# Patient Record
Sex: Female | Born: 1980 | Race: White | Hispanic: No | Marital: Single
Health system: Southern US, Academic
[De-identification: ages and names within clinical notes are randomized; demographics above are authoritative.]

## PROBLEM LIST (undated history)

## (undated) ENCOUNTER — Encounter: Attending: Gynecologic Oncology | Primary: Gynecologic Oncology

## (undated) ENCOUNTER — Telehealth

## (undated) ENCOUNTER — Encounter: Attending: Women's Health | Primary: Women's Health

## (undated) ENCOUNTER — Encounter

## (undated) ENCOUNTER — Encounter: Attending: Adult Health | Primary: Adult Health

## (undated) ENCOUNTER — Telehealth
Attending: Student in an Organized Health Care Education/Training Program | Primary: Student in an Organized Health Care Education/Training Program

## (undated) ENCOUNTER — Ambulatory Visit: Attending: Gynecologic Oncology | Primary: Gynecologic Oncology

## (undated) ENCOUNTER — Ambulatory Visit

## (undated) ENCOUNTER — Encounter: Attending: Obstetrics & Gynecology | Primary: Obstetrics & Gynecology

## (undated) ENCOUNTER — Encounter
Attending: Student in an Organized Health Care Education/Training Program | Primary: Student in an Organized Health Care Education/Training Program

## (undated) ENCOUNTER — Ambulatory Visit: Attending: Obstetrics & Gynecology | Primary: Obstetrics & Gynecology

## (undated) ENCOUNTER — Encounter: Attending: Clinical | Primary: Clinical

## (undated) ENCOUNTER — Encounter: Payer: BLUE CROSS/BLUE SHIELD | Attending: Gynecologic Oncology | Primary: Gynecologic Oncology

## (undated) ENCOUNTER — Telehealth: Attending: Gynecologic Oncology | Primary: Gynecologic Oncology

## (undated) ENCOUNTER — Ambulatory Visit: Payer: BLUE CROSS/BLUE SHIELD

## (undated) ENCOUNTER — Encounter: Attending: Anesthesiology | Primary: Anesthesiology

## (undated) ENCOUNTER — Telehealth: Attending: Adult Health | Primary: Adult Health

## (undated) ENCOUNTER — Telehealth: Attending: Physician Assistant | Primary: Physician Assistant

## (undated) ENCOUNTER — Encounter: Attending: Physician Assistant | Primary: Physician Assistant

## (undated) ENCOUNTER — Encounter: Attending: Registered" | Primary: Registered"

## (undated) DIAGNOSIS — I252 Old myocardial infarction: Secondary | ICD-10-CM

## (undated) DIAGNOSIS — N200 Calculus of kidney: Secondary | ICD-10-CM

## (undated) DIAGNOSIS — F909 Attention-deficit hyperactivity disorder, unspecified type: Secondary | ICD-10-CM

## (undated) DIAGNOSIS — F419 Anxiety disorder, unspecified: Secondary | ICD-10-CM

## (undated) DIAGNOSIS — I10 Essential (primary) hypertension: Secondary | ICD-10-CM

## (undated) DIAGNOSIS — I700 Atherosclerosis of aorta: Secondary | ICD-10-CM

## (undated) DIAGNOSIS — Z955 Presence of coronary angioplasty implant and graft: Secondary | ICD-10-CM

## (undated) DIAGNOSIS — K439 Ventral hernia without obstruction or gangrene: Secondary | ICD-10-CM

## (undated) DIAGNOSIS — F32A Depression, unspecified: Secondary | ICD-10-CM

## (undated) DIAGNOSIS — N201 Calculus of ureter: Secondary | ICD-10-CM

## (undated) DIAGNOSIS — F329 Major depressive disorder, single episode, unspecified: Secondary | ICD-10-CM

## (undated) DIAGNOSIS — C569 Malignant neoplasm of unspecified ovary: Secondary | ICD-10-CM

## (undated) HISTORY — PX: DILATION AND CURETTAGE OF UTERUS: SHX78

## (undated) HISTORY — PX: MOUTH SURGERY: SHX715

## (undated) HISTORY — PX: CORONARY ANGIOPLASTY WITH STENT PLACEMENT: SHX49

## (undated) HISTORY — PX: TRANSTHORACIC ECHOCARDIOGRAM: SHX275

## (undated) HISTORY — PX: ROBOTIC ASSISTED TOTAL HYSTERECTOMY: SHX6085

## (undated) HISTORY — PX: OTHER SURGICAL HISTORY: SHX169

## (undated) HISTORY — PX: BREAST REDUCTION SURGERY: SHX8

## (undated) HISTORY — PX: ABDOMINAL HYSTERECTOMY: SHX81

---

## 2000-04-14 ENCOUNTER — Other Ambulatory Visit: Admission: RE | Admit: 2000-04-14 | Discharge: 2000-04-14 | Payer: Self-pay | Admitting: Obstetrics and Gynecology

## 2001-03-28 ENCOUNTER — Other Ambulatory Visit: Admission: RE | Admit: 2001-03-28 | Discharge: 2001-03-28 | Payer: Self-pay | Admitting: Obstetrics and Gynecology

## 2002-04-17 ENCOUNTER — Other Ambulatory Visit: Admission: RE | Admit: 2002-04-17 | Discharge: 2002-04-17 | Payer: Self-pay | Admitting: Obstetrics and Gynecology

## 2004-01-03 ENCOUNTER — Other Ambulatory Visit: Admission: RE | Admit: 2004-01-03 | Discharge: 2004-01-03 | Payer: Self-pay | Admitting: *Deleted

## 2004-01-15 ENCOUNTER — Encounter: Admission: RE | Admit: 2004-01-15 | Discharge: 2004-04-14 | Payer: Self-pay | Admitting: Family Medicine

## 2006-09-14 ENCOUNTER — Encounter: Admission: RE | Admit: 2006-09-14 | Discharge: 2006-09-14 | Payer: Self-pay | Admitting: Oral Surgery

## 2006-10-29 ENCOUNTER — Other Ambulatory Visit: Admission: RE | Admit: 2006-10-29 | Discharge: 2006-10-29 | Payer: Self-pay | Admitting: Obstetrics and Gynecology

## 2008-01-09 ENCOUNTER — Other Ambulatory Visit: Admission: RE | Admit: 2008-01-09 | Discharge: 2008-01-09 | Payer: Self-pay | Admitting: Obstetrics and Gynecology

## 2009-01-13 ENCOUNTER — Emergency Department (HOSPITAL_COMMUNITY): Admission: EM | Admit: 2009-01-13 | Discharge: 2009-01-13 | Payer: Self-pay | Admitting: Emergency Medicine

## 2013-04-25 DIAGNOSIS — I10 Essential (primary) hypertension: Secondary | ICD-10-CM | POA: Insufficient documentation

## 2013-04-25 DIAGNOSIS — J45909 Unspecified asthma, uncomplicated: Secondary | ICD-10-CM | POA: Insufficient documentation

## 2013-04-25 DIAGNOSIS — F988 Other specified behavioral and emotional disorders with onset usually occurring in childhood and adolescence: Secondary | ICD-10-CM | POA: Insufficient documentation

## 2013-04-25 DIAGNOSIS — E669 Obesity, unspecified: Secondary | ICD-10-CM | POA: Insufficient documentation

## 2013-05-09 DIAGNOSIS — I252 Old myocardial infarction: Secondary | ICD-10-CM

## 2013-05-09 DIAGNOSIS — I214 Non-ST elevation (NSTEMI) myocardial infarction: Secondary | ICD-10-CM | POA: Insufficient documentation

## 2013-05-09 HISTORY — DX: Old myocardial infarction: I25.2

## 2013-06-13 DIAGNOSIS — G4730 Sleep apnea, unspecified: Secondary | ICD-10-CM | POA: Insufficient documentation

## 2013-08-10 ENCOUNTER — Inpatient Hospital Stay (HOSPITAL_COMMUNITY)
Admission: RE | Admit: 2013-08-10 | Discharge: 2013-08-10 | Disposition: A | Discharge status: Home / Self Care | Payer: Self-pay | Source: Ambulatory Visit

## 2013-08-10 NOTE — Progress Notes (Signed)
Cardiac Rehab Medication Review by a Pharmacist  Does the patient  feel that his/her medications are working for him/her?  yes  Has the patient been experiencing any side effects to the medications prescribed?  no  Does the patient measure his/her own blood pressure or blood glucose at home?  yes   Does the patient have any problems obtaining medications due to transportation or finances?   no  Understanding of regimen: excellent Understanding of indications: excellent Potential of compliance: excellent    Pharmacist comments: 33 yo f who presented today to the cardiac rehab clinic for initial evaluation and medication review.  Patient expresses concern over her blood pressure.  She was recently switched from lisinopril to losartan due to intolerance (cough).  Patient monitors blood pressure at home and finds it is higher on the losartan. Discussed with  Nurse who will follow up with doctor.  Patient expresses no other concerns at this time and is adherent to medications.   Cassie L. Nicole Kindred, PharmD Clinical Pharmacy Resident Pager: [phone] 08/10/2013 8:45 AM

## 2013-08-14 ENCOUNTER — Encounter (HOSPITAL_COMMUNITY): Payer: Self-pay

## 2013-08-16 ENCOUNTER — Encounter (HOSPITAL_COMMUNITY): Payer: Self-pay

## 2013-08-18 ENCOUNTER — Encounter (HOSPITAL_COMMUNITY)
Admission: RE | Admit: 2013-08-18 | Discharge: 2013-08-18 | Disposition: A | Discharge status: Home / Self Care | Payer: Self-pay | Source: Ambulatory Visit | Attending: Cardiology | Admitting: Cardiology

## 2013-08-18 ENCOUNTER — Encounter (HOSPITAL_COMMUNITY): Payer: Self-pay

## 2013-08-18 DIAGNOSIS — I252 Old myocardial infarction: Secondary | ICD-10-CM | POA: Insufficient documentation

## 2013-08-18 DIAGNOSIS — Z5189 Encounter for other specified aftercare: Secondary | ICD-10-CM | POA: Insufficient documentation

## 2013-08-18 NOTE — Progress Notes (Signed)
Pt started cardiac rehab today.  Pt tolerated moderate exercise without difficulty.  Pt transferred to [facility] from [facility]. Pt asymptomatic. VSS except exertional hypertension-peak 190/100 work load decreased recheck:  174/80, returned to normal with rest.   telemetry-NSR.  Will continue to monitor. Pt oriented to exercise equipment and routine.  Understanding verbalized.

## 2013-08-21 ENCOUNTER — Encounter (HOSPITAL_COMMUNITY): Payer: Self-pay

## 2013-08-23 ENCOUNTER — Encounter (HOSPITAL_COMMUNITY): Payer: Self-pay

## 2013-08-25 ENCOUNTER — Encounter (HOSPITAL_COMMUNITY): Payer: Self-pay

## 2013-08-28 ENCOUNTER — Encounter (HOSPITAL_COMMUNITY): Payer: Self-pay

## 2013-08-30 ENCOUNTER — Encounter (HOSPITAL_COMMUNITY): Admission: RE | Admit: 2013-08-30 | Payer: Self-pay | Source: Ambulatory Visit

## 2013-08-30 ENCOUNTER — Telehealth (HOSPITAL_COMMUNITY): Payer: Self-pay | Admitting: Cardiac Rehabilitation

## 2013-08-30 NOTE — Telephone Encounter (Signed)
pc to pt to assess reason for continued absence from cardiac rehab.  Pt states she is no longer interested in attending.  She feels tat she has adequately met her rehab goals through her previous program and plans to exercise on her own.  Pt will be discharged from program.

## 2013-09-01 ENCOUNTER — Encounter (HOSPITAL_COMMUNITY): Payer: Self-pay

## 2013-09-04 ENCOUNTER — Encounter (HOSPITAL_COMMUNITY): Payer: Self-pay

## 2013-09-06 ENCOUNTER — Encounter (HOSPITAL_COMMUNITY): Payer: Self-pay

## 2013-09-08 ENCOUNTER — Encounter (HOSPITAL_COMMUNITY): Payer: Self-pay

## 2013-09-11 ENCOUNTER — Encounter (HOSPITAL_COMMUNITY): Payer: Self-pay

## 2013-09-13 ENCOUNTER — Encounter (HOSPITAL_COMMUNITY): Payer: Self-pay

## 2013-09-13 ENCOUNTER — Ambulatory Visit (INDEPENDENT_AMBULATORY_CARE_PROVIDER_SITE_OTHER): Payer: BC Managed Care – PPO | Admitting: Podiatrist

## 2013-09-13 ENCOUNTER — Encounter: Payer: Self-pay | Admitting: Podiatrist

## 2013-09-13 VITALS — BP 143/89 | HR 65 | Resp 16

## 2013-09-13 DIAGNOSIS — L03039 Cellulitis of unspecified toe: Secondary | ICD-10-CM

## 2013-09-13 NOTE — Patient Instructions (Signed)

## 2013-09-13 NOTE — Progress Notes (Signed)
   Subjective:    Patient ID: Shelley Vega, female    DOB: [birth year], 33 y.o.   MRN: [id number]  HPI Comments: "I have a sore toe"  Patient c/o throbbing 1st toe left, lateral border, for several months. Just recently it has gotten worse. The area is red and swollen. Some drainage. She has been soaking and using antibiotic ointment. Not any better.  Note:  She had a heart attack April 20th 2015. She is on a blood thinner.  Toe Pain       Review of Systems  Musculoskeletal: Positive for gait problem.  Skin: Positive for wound.  Hematological: Bruises/bleeds easily.  All other systems reviewed and are negative.      Objective:   Physical Exam  Patient is awake, alert, and oriented x 3.  In no acute distress.  Vascular status is intact with palpable pedal pulses at 2/4 DP and PT bilateral and capillary refill time within normal limits. Neurological sensation is also intact bilaterally via Semmes Weinstein monofilament at 5/5 sites. Light touch, vibratory sensation, Achilles tendon reflex is intact. Dermatological exam reveals skin color, turger and texture as normal. No open lesions present.  Musculature intact with dorsiflexion, plantarflexion, inversion, eversion.  Left great toenail appears infected ingrown on the lateral nail border. It is painful to the touch. Redness around the border is also present. No streaking or lymphangitis is seen.   Assessment & Plan:  Paronychia left great toenail lateral border  Plan: Recommended an incision and drainage and the patient agreed I prepped the skin with alcohol and partially lidocaine Marcaine mixture in a digital block fashion. The toe was then prepped with Betadine exsanguinated. The lateral nail border was then sharply removed. Antibiotic ointment and a dressing was applied she was given instructions for aftercare. She'll be seen back for followup as needed and will call if any problems arise.

## 2013-09-14 ENCOUNTER — Ambulatory Visit: Payer: Self-pay

## 2013-09-15 ENCOUNTER — Encounter (HOSPITAL_COMMUNITY): Payer: Self-pay

## 2013-09-18 ENCOUNTER — Encounter (HOSPITAL_COMMUNITY): Payer: Self-pay

## 2014-06-15 ENCOUNTER — Other Ambulatory Visit: Payer: Self-pay | Admitting: Obstetrics and Gynecology

## 2014-06-15 DIAGNOSIS — N858 Other specified noninflammatory disorders of uterus: Secondary | ICD-10-CM

## 2014-06-19 ENCOUNTER — Ambulatory Visit
Admission: RE | Admit: 2014-06-19 | Discharge: 2014-06-19 | Disposition: A | Discharge status: Home / Self Care | Payer: BC Managed Care – PPO | Source: Ambulatory Visit | Attending: Obstetrics and Gynecology | Admitting: Obstetrics and Gynecology

## 2014-06-19 DIAGNOSIS — N858 Other specified noninflammatory disorders of uterus: Secondary | ICD-10-CM

## 2014-06-19 MED ORDER — IOHEXOL 300 MG/ML  SOLN
100.0000 mL | Freq: Once | INTRAMUSCULAR | Status: AC | PRN
  Administered 2014-06-19: 100 mL via INTRAVENOUS

## 2014-06-22 ENCOUNTER — Ambulatory Visit: Payer: BC Managed Care – PPO | Admitting: Certified Nurse Midwife

## 2014-07-02 ENCOUNTER — Ambulatory Visit: Payer: BC Managed Care – PPO | Admitting: Gynecologic Oncology

## 2014-07-23 DIAGNOSIS — C569 Malignant neoplasm of unspecified ovary: Secondary | ICD-10-CM | POA: Insufficient documentation

## 2014-07-25 ENCOUNTER — Ambulatory Visit: Payer: BC Managed Care – PPO | Attending: Gynecologic Oncology | Admitting: Gynecologic Oncology

## 2014-07-25 ENCOUNTER — Encounter: Payer: Self-pay | Admitting: Gynecologic Oncology

## 2014-07-25 VITALS — BP 143/98 | HR 62 | Temp 98.4°F | Resp 18 | Ht 61.75 in | Wt 181.9 lb

## 2014-07-25 DIAGNOSIS — I2510 Atherosclerotic heart disease of native coronary artery without angina pectoris: Secondary | ICD-10-CM | POA: Insufficient documentation

## 2014-07-25 DIAGNOSIS — E785 Hyperlipidemia, unspecified: Secondary | ICD-10-CM | POA: Insufficient documentation

## 2014-07-25 DIAGNOSIS — C561 Malignant neoplasm of right ovary: Secondary | ICD-10-CM

## 2014-07-25 DIAGNOSIS — Z7189 Other specified counseling: Secondary | ICD-10-CM | POA: Diagnosis not present

## 2014-07-25 MED ORDER — HYDROMORPHONE HCL 4 MG PO TABS: 4.0000 mg | ORAL_TABLET | ORAL | Status: DC | PRN

## 2014-07-25 NOTE — Progress Notes (Signed)
Consult Note: Gyn-Onc  IRISHA GRANDMAISON 34 y.o. 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 Disposition is to carboplatin and taxol chemotherapy. Patient should also undergo genetic testing.  30 minutes face to face time with spent with the patient and her mother today discussing her pathology and treatment planning. The patient is interested in proceeding with chemotherapy with paclitaxel and carboplatin. We also discussed the importance of proceeding with genetic testing and counseling. They wish to do both. Of note the patient still has her left ovary. We may consider removal of that ovary pending the results of her genetics when she completes her chemotherapy and she is open to thinking about this during her treatment as she's not been for her to make a decision and initially when her fertility sparing procedures done. She scheduled to come in on Friday for removal. After discussion she would like to have her chemotherapy in [facility] where I can coordinate all of her care. We will schedule that accordingly.  CC:  Chief Complaint  Patient presents with  . Ovarian Cancer    HPI: Referring Provider: Juanda Chance, NP  Primary Oncologist: Nancy Marus, MD  History of Present Illness: Marya Lowden is a 34 y.o. woman who presented with some odor with urination, vaginal discharge and STD testing. She reports regular periods without intermenstrual bleeding that states they've become much heavier for the past couple of months. She was subsequently diagnosed with a large abdominal mass. Today, she denies early satiety, significant weight loss, or bloating. She does note that she has some increased fullness of the past couple weeks but nothing significant.  Patients cardiologist is Dr. Nolberto Hanlon [phone]) in [place] associated with [place]. She states that she met with her cardiologist in April 30 spoken to her about the possibility upcoming surgery. Her cardiologist said  that she will start working on clearance and she does not require any additional testing, per the patient. She is able to go for 5 stairs without difficulty and perform her daily activities.  Of note, the patient has currently single and does require future fertility if possible. Interval History:  Oncology Summary:    Malignant neoplasm of right ovary (RAF-HCC)    07/13/2014  Initial Diagnosis  Malignant neoplasm of right ovary (RAF-HCC)    07/13/2014  Surgery  Exploratory laparotomy with right salpingoophorectomy, right ureterolysis, infracolic omentectomy, right pelvic and aortic lymph node dissection, and staging biopsies     Findings: 1) 20cm right adnexal mass adherent to the mesentery and right pelvic sidewall - frozen section evaluation showing at least borderline tumor with extensive calcifications 2) Retroperitoneal fibrosis requiring right ureterolysis 3) Normal abdominal survey with normal appendix, large bowel, small bowel (run from terminal ileum to ligament of Treitz), smooth diaphragms bilaterally, smooth liver edge, normal appearing omentum and stomach 4) Normal appearing left tube and ovary 5) Normal uterus  Pathology: A: Ovary, right, oophorectomy  - 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 (please see synoptic report and comment)  B: Pelvic adhesion, excision - Involved by serous carcinoma, largest focus measures 0.7 cm  C: Omentum, omentectomy - Involved by serous carcinoma, focus measures 0.3 cm   D: Peritoneum, pelvic nodule, excision  - No serous carcinoma identified - Fibroadipose tissue with chronic inflammation and focal calcification  E: Lymph nodes, pelvic, right, resection - Nine lymph nodes negative for metastatic carcinoma (0/9)  F: Peritoneum, posterior adhesion,  biopsy - No serous carcinoma identified - Fibrovascular tissue with hemorrhage  G:  Lymph node, para-aortic, excision  - Four lymph nodes negative for metastatic carcinoma (0/4)  H: Peritoneum, gutter, right, biopsy - No serous carcinoma identified - Fibroadipose tissue with chronic inflammation  I: Peritoneum, gutter, left, biopsy - No serous carcinoma identified - Fibroadipose tissue        Electronically signed by Benjaman Kindler, MD on 07/20/2014 at 32    Synoptic Report    OVARY: Oophorectomy, Salpingo-Oophorectomy, Subtotal Oophorectomy or Removal of Tumor in Fragments, Hysterectomy with Salpingo-Oophorectomy  OVARY: OOPHORECTOMY - All Specimens   Specimen   Ovary   SPECIMEN     Procedure   Right oophorectomy   Procedure   Omentectomy   Procedure   Peritoneal biopsies   Lymph Node Sampling   Performed   Right ovary   Capsule intact   Primary Tumor Site   Right ovary   TUMOR     Histologic Type   Serous, carcinoma   Histologic Grade   Two-Tier Grading System (may be applied to serous carcinomas and immature teratomas only)   High grade   EXTENT     Tumor Size   Right Ovary Tumor Size   Tumor Size - Right Ovary   Greatest dimension (cm): 21   Additional Dimension (cm)   18   Additional Dimension (cm)   10   Ovarian Surface Involvement   Absent   Implants (only applies to advanced stage serous / seromucinous borderline tumors)   Invasive Implant(s)   Present   Specify Site(s)   Omentum, part C; pelvic nodule (peritoneum), part D   Organs Submitted   Right ovary   Right Ovary - Extent   Involved   Organs Submitted   Right fallopian tube   Right Fallopian Tube - Extent   Not involved   Organs Submitted   Omentum   Omentum - Extent   Involved   Organs Submitted   Peritoneum   Peritoneum - Extent   Involved   ACCESSORY FINDINGS     Lymph-Vascular Invasion   Not identified   STAGE (pTNM [FIGO])     TNM Descriptors   Not applicable   Primary Tumor (pT)   pT3a [IIIA]: Microscopic peritoneal metastasis beyond pelvis (no macroscopic tumor)   Regional  Lymph Nodes (pN)   pN0: No regional lymph node metastasis   Number of Lymph Nodes Examined   Specify: 13   Number of Lymph Nodes Involved   Specify: 0   Distant Metastasis (pM)   Not applicable          Stage/Disposition: Deaisha Welborn is a 34 y.o. 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 Disposition is to carboplatin and taxol chemotherapy. Patient should also undergo genetic testing.       Review of Systems  Current Meds:  Outpatient Encounter Prescriptions as of 07/25/2014  Medication Sig  . docusate sodium (COLACE) 100 MG capsule Take 100 mg by mouth.  . enoxaparin (LOVENOX) 40 MG/0.4ML injection Inject 40 mg into the skin.  Marland Kitchen gabapentin (NEURONTIN) 100 MG capsule Take 100 mg by mouth.  Marland Kitchen HYDROmorphone (DILAUDID) 4 MG tablet Take 4 mg by mouth.  . lidocaine (LIDODERM) 5 % Place onto the skin.  Marland Kitchen albuterol (PROVENTIL HFA;VENTOLIN HFA) 108 (90 BASE) MCG/ACT inhaler Inhale 2 puffs into the lungs every 6 (six) hours as needed for wheezing or shortness of breath.  . amphetamine-dextroamphetamine (ADDERALL) 20 MG tablet  Take 20 mg by mouth daily with lunch.  . amphetamine-dextroamphetamine (ADDERALL) 30 MG tablet Take 30 mg by mouth every morning.  Marland Kitchen aspirin 81 MG tablet Take 81 mg by mouth daily.  Marland Kitchen aspirin EC 81 MG tablet Take 81 mg by mouth.  Marland Kitchen atorvastatin (LIPITOR) 40 MG tablet Take 40 mg by mouth daily.  Marland Kitchen atorvastatin (LIPITOR) 40 MG tablet Take 40 mg by mouth.  . cetirizine (ZYRTEC) 10 MG tablet Take 10 mg by mouth daily.  . cetirizine (ZYRTEC) 10 MG tablet Take 10 mg by mouth.  . fluticasone (FLONASE) 50 MCG/ACT nasal spray Place 2 sprays into both nostrils daily as needed for allergies or rhinitis.  Marland Kitchen losartan (COZAAR) 25 MG tablet Take 25 mg by mouth daily.  . metoprolol succinate (TOPROL-XL) 100 MG 24 hr tablet Take 100 mg by mouth daily. Take with or immediately following a meal.  . montelukast (SINGULAIR) 10 MG tablet Take 10 mg by mouth daily.  .  nitroGLYCERIN (NITROSTAT) 0.4 MG SL tablet Place 0.4 mg under the tongue every 5 (five) minutes as needed for chest pain.  . prasugrel (EFFIENT) 10 MG TABS tablet Take 10 mg by mouth daily.   No facility-administered encounter medications on file as of 07/25/2014.    Allergy:  Allergies  Allergen Reactions  . Lisinopril Cough    Social Hx:   History   Social History  . Marital Status: Single    Spouse Name: N/A  . Number of Children: N/A  . Years of Education: N/A   Occupational History  . Not on file.   Social History Main Topics  . Smoking status: Former Smoker    Quit date: 08/19/2011  . Smokeless tobacco: Not on file  . Alcohol Use: Not on file  . Drug Use: Not on file  . Sexual Activity: Not on file   Other Topics Concern  . Not on file   Social History Narrative  . No narrative on file    Past Surgical Hx: No past surgical history on file.  Past Medical Hx:  Past Medical History  Diagnosis Date  . Heart attack     Oncology Hx:    Cancer of ovary   07/13/2014 Initial Diagnosis Cancer of ovary, IIIA   07/13/2014 Surgery RSO, staging    Family Hx: No family history on file.  Vitals:  Blood pressure 143/98, pulse 62, temperature 98.4 F (36.9 C), temperature source Oral, resp. rate 18, height 5' 1.75" (1.568 m), weight 181 lb 14.4 oz (82.509 kg), SpO2 100 %.  Physical Exam: Well-nourished vulva female in no acute distress.  Abdomen: Well-healed vertical midline incision. Some erythema at the points of the staples. Some moistness of the incision and level of the umbilicus and under the pannus. Gauze was placed and the patient and her mother was shown how to do this.  Assessment/Plan:   Sherol Dade., MD 07/25/2014, 8:43 AM

## 2014-07-25 NOTE — Patient Instructions (Signed)
Carboplatin injection  What is this medicine?  CARBOPLATIN (KAR boe pla tin) is a chemotherapy drug. It targets fast dividing cells, like cancer cells, and causes these cells to die. This medicine is used to treat ovarian cancer and many other cancers.  This medicine may be used for other purposes; ask your health care provider or pharmacist if you have questions.  COMMON BRAND NAME(S): Paraplatin  What should I tell my health care provider before I take this medicine?  They need to know if you have any of these conditions:  -blood disorders  -hearing problems  -kidney disease  -recent or ongoing radiation therapy  -an unusual or allergic reaction to carboplatin, cisplatin, other chemotherapy, other medicines, foods, dyes, or preservatives  -pregnant or trying to get pregnant  -breast-feeding  How should I use this medicine?  This drug is usually given as an infusion into a vein. It is administered in a hospital or clinic by a specially trained health care professional.  Talk to your pediatrician regarding the use of this medicine in children. Special care may be needed.  Overdosage: If you think you have taken too much of this medicine contact a poison control center or emergency room at once.  NOTE: This medicine is only for you. Do not share this medicine with others.  What if I miss a dose?  It is important not to miss a dose. Call your doctor or health care professional if you are unable to keep an appointment.  What may interact with this medicine?  -medicines for seizures  -medicines to increase blood counts like filgrastim, pegfilgrastim, sargramostim  -some antibiotics like amikacin, gentamicin, neomycin, streptomycin, tobramycin  -vaccines  Talk to your doctor or health care professional before taking any of these medicines:  -acetaminophen  -aspirin  -ibuprofen  -ketoprofen  -naproxen  This list may not describe all possible interactions. Give your health care provider a list of all the medicines, herbs,  non-prescription drugs, or dietary supplements you use. Also tell them if you smoke, drink alcohol, or use illegal drugs. Some items may interact with your medicine.  What should I watch for while using this medicine?  Your condition will be monitored carefully while you are receiving this medicine. You will need important blood work done while you are taking this medicine.  This drug may make you feel generally unwell. This is not uncommon, as chemotherapy can affect healthy cells as well as cancer cells. Report any side effects. Continue your course of treatment even though you feel ill unless your doctor tells you to stop.  In some cases, you may be given additional medicines to help with side effects. Follow all directions for their use.  Call your doctor or health care professional for advice if you get a fever, chills or sore throat, or other symptoms of a cold or flu. Do not treat yourself. This drug decreases your body's ability to fight infections. Try to avoid being around people who are sick.  This medicine may increase your risk to bruise or bleed. Call your doctor or health care professional if you notice any unusual bleeding.  Be careful brushing and flossing your teeth or using a toothpick because you may get an infection or bleed more easily. If you have any dental work done, tell your dentist you are receiving this medicine.  Avoid taking products that contain aspirin, acetaminophen, ibuprofen, naproxen, or ketoprofen unless instructed by your doctor. These medicines may hide a fever.  Do not become   pregnant while taking this medicine. Women should inform their doctor if they wish to become pregnant or think they might be pregnant. There is a potential for serious side effects to an unborn child. Talk to your health care professional or pharmacist for more information. Do not breast-feed an infant while taking this medicine.  What side effects may I notice from receiving this medicine?  Side effects  that you should report to your doctor or health care professional as soon as possible:  -allergic reactions like skin rash, itching or hives, swelling of the face, lips, or tongue  -signs of infection - fever or chills, cough, sore throat, pain or difficulty passing urine  -signs of decreased platelets or bleeding - bruising, pinpoint red spots on the skin, black, tarry stools, nosebleeds  -signs of decreased red blood cells - unusually weak or tired, fainting spells, lightheadedness  -breathing problems  -changes in hearing  -changes in vision  -chest pain  -high blood pressure  -low blood counts - This drug may decrease the number of white blood cells, red blood cells and platelets. You may be at increased risk for infections and bleeding.  -nausea and vomiting  -pain, swelling, redness or irritation at the injection site  -pain, tingling, numbness in the hands or feet  -problems with balance, talking, walking  -trouble passing urine or change in the amount of urine  Side effects that usually do not require medical attention (report to your doctor or health care professional if they continue or are bothersome):  -hair loss  -loss of appetite  -metallic taste in the mouth or changes in taste  This list may not describe all possible side effects. Call your doctor for medical advice about side effects. You may report side effects to FDA at 1-800-FDA-1088.  Where should I keep my medicine?  This drug is given in a hospital or clinic and will not be stored at home.  NOTE: This sheet is a summary. It may not cover all possible information. If you have questions about this medicine, talk to your doctor, pharmacist, or health care provider.   2015, Elsevier/Gold Standard. (2007-04-12 14:38:05)  Paclitaxel injection  What is this medicine?  PACLITAXEL (PAK li TAX el) is a chemotherapy drug. It targets fast dividing cells, like cancer cells, and causes these cells to die. This medicine is used to treat ovarian cancer,  breast cancer, and other cancers.  This medicine may be used for other purposes; ask your health care provider or pharmacist if you have questions.  COMMON BRAND NAME(S): Onxol, Taxol  What should I tell my health care provider before I take this medicine?  They need to know if you have any of these conditions:  -blood disorders  -irregular heartbeat  -infection (especially a virus infection such as chickenpox, cold sores, or herpes)  -liver disease  -previous or ongoing radiation therapy  -an unusual or allergic reaction to paclitaxel, alcohol, polyoxyethylated castor oil, other chemotherapy agents, other medicines, foods, dyes, or preservatives  -pregnant or trying to get pregnant  -breast-feeding  How should I use this medicine?  This drug is given as an infusion into a vein. It is administered in a hospital or clinic by a specially trained health care professional.  Talk to your pediatrician regarding the use of this medicine in children. Special care may be needed.  Overdosage: If you think you have taken too much of this medicine contact a poison control center or emergency room at once.  NOTE:   This medicine is only for you. Do not share this medicine with others.  What if I miss a dose?  It is important not to miss your dose. Call your doctor or health care professional if you are unable to keep an appointment.  What may interact with this medicine?  Do not take this medicine with any of the following medications:  -disulfiram  -metronidazole  This medicine may also interact with the following medications:  -cyclosporine  -diazepam  -ketoconazole  -medicines to increase blood counts like filgrastim, pegfilgrastim, sargramostim  -other chemotherapy drugs like cisplatin, doxorubicin, epirubicin, etoposide, teniposide, vincristine  -quinidine  -testosterone  -vaccines  -verapamil  Talk to your doctor or health care professional before taking any of these  medicines:  -acetaminophen  -aspirin  -ibuprofen  -ketoprofen  -naproxen  This list may not describe all possible interactions. Give your health care provider a list of all the medicines, herbs, non-prescription drugs, or dietary supplements you use. Also tell them if you smoke, drink alcohol, or use illegal drugs. Some items may interact with your medicine.  What should I watch for while using this medicine?  Your condition will be monitored carefully while you are receiving this medicine. You will need important blood work done while you are taking this medicine.  This drug may make you feel generally unwell. This is not uncommon, as chemotherapy can affect healthy cells as well as cancer cells. Report any side effects. Continue your course of treatment even though you feel ill unless your doctor tells you to stop.  In some cases, you may be given additional medicines to help with side effects. Follow all directions for their use.  Call your doctor or health care professional for advice if you get a fever, chills or sore throat, or other symptoms of a cold or flu. Do not treat yourself. This drug decreases your body's ability to fight infections. Try to avoid being around people who are sick.  This medicine may increase your risk to bruise or bleed. Call your doctor or health care professional if you notice any unusual bleeding.  Be careful brushing and flossing your teeth or using a toothpick because you may get an infection or bleed more easily. If you have any dental work done, tell your dentist you are receiving this medicine.  Avoid taking products that contain aspirin, acetaminophen, ibuprofen, naproxen, or ketoprofen unless instructed by your doctor. These medicines may hide a fever.  Do not become pregnant while taking this medicine. Women should inform their doctor if they wish to become pregnant or think they might be pregnant. There is a potential for serious side effects to an unborn child. Talk to  your health care professional or pharmacist for more information. Do not breast-feed an infant while taking this medicine.  Men are advised not to father a child while receiving this medicine.  What side effects may I notice from receiving this medicine?  Side effects that you should report to your doctor or health care professional as soon as possible:  -allergic reactions like skin rash, itching or hives, swelling of the face, lips, or tongue  -low blood counts - This drug may decrease the number of white blood cells, red blood cells and platelets. You may be at increased risk for infections and bleeding.  -signs of infection - fever or chills, cough, sore throat, pain or difficulty passing urine  -signs of decreased platelets or bleeding - bruising, pinpoint red spots on the skin, black, tarry   stools, nosebleeds  -signs of decreased red blood cells - unusually weak or tired, fainting spells, lightheadedness  -breathing problems  -chest pain  -high or low blood pressure  -mouth sores  -nausea and vomiting  -pain, swelling, redness or irritation at the injection site  -pain, tingling, numbness in the hands or feet  -slow or irregular heartbeat  -swelling of the ankle, feet, hands  Side effects that usually do not require medical attention (report to your doctor or health care professional if they continue or are bothersome):  -bone pain  -complete hair loss including hair on your head, underarms, pubic hair, eyebrows, and eyelashes  -changes in the color of fingernails  -diarrhea  -loosening of the fingernails  -loss of appetite  -muscle or joint pain  -red flush to skin  -sweating  This list may not describe all possible side effects. Call your doctor for medical advice about side effects. You may report side effects to FDA at 1-800-FDA-1088.  Where should I keep my medicine?  This drug is given in a hospital or clinic and will not be stored at home.  NOTE: This sheet is a summary. It may not cover all possible  information. If you have questions about this medicine, talk to your doctor, pharmacist, or health care provider.   2015, Elsevier/Gold Standard. (2012-02-29 16:41:21)

## 2014-08-02 ENCOUNTER — Other Ambulatory Visit: Payer: Self-pay | Admitting: Gynecologic Oncology

## 2014-08-02 ENCOUNTER — Other Ambulatory Visit: Payer: BC Managed Care – PPO

## 2014-08-02 ENCOUNTER — Ambulatory Visit (HOSPITAL_BASED_OUTPATIENT_CLINIC_OR_DEPARTMENT_OTHER): Payer: BC Managed Care – PPO | Admitting: Genetic Counselor

## 2014-08-02 ENCOUNTER — Encounter: Payer: Self-pay | Admitting: Genetic Counselor

## 2014-08-02 DIAGNOSIS — C561 Malignant neoplasm of right ovary: Secondary | ICD-10-CM

## 2014-08-02 DIAGNOSIS — Z807 Family history of other malignant neoplasms of lymphoid, hematopoietic and related tissues: Secondary | ICD-10-CM

## 2014-08-02 DIAGNOSIS — Z8042 Family history of malignant neoplasm of prostate: Secondary | ICD-10-CM

## 2014-08-02 DIAGNOSIS — Z803 Family history of malignant neoplasm of breast: Secondary | ICD-10-CM

## 2014-08-02 DIAGNOSIS — Z315 Encounter for genetic counseling: Secondary | ICD-10-CM | POA: Diagnosis not present

## 2014-08-02 NOTE — Progress Notes (Signed)
REFERRING PROVIDER: Nancy Marus, MD  PRIMARY PROVIDER:  No PCP Per Patient  PRIMARY REASON FOR VISIT:  1. Ovarian cancer on right   2. Family history of breast cancer in female   3. Family history of prostate cancer   4. Family history of non-Hodgkin's lymphoma      HISTORY OF PRESENT ILLNESS:   Shelley Vega, a 34 y.o. female, was seen for a [facility] cancer genetics consultation at the request of Dr. Alycia Rossetti due to a personal history of ovarian cancer diagnosed at 33 and a family history of breast, lymphoma, and prostate cancers.  Shelley Vega presents to clinic today with her mother to discuss the possibility of a hereditary predisposition to cancer, genetic testing, and to further clarify her future cancer risks, as well as potential cancer risks for family members.   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 This was treated with right salpingo-oophorectomy to be followed by chemotherapy with paclitaxel and carboplatin.  Results of genetic testing will help Shelley Vega and Dr. Alycia Rossetti to decide whether or not they should move forward with removal of the left ovary.  CANCER HISTORY:    Cancer of ovary   07/13/2014 Initial Diagnosis Cancer of ovary, IIIA   07/13/2014 Surgery RSO, staging     HORMONAL RISK FACTORS:  Menarche was at age 17.  First live birth at age - no children. OCP use for approximately 3 years; NuvaRing for approximately 6 years Ovaries intact: right salpingo-oophorectomy; still has left ovary  Hysterectomy: no.  Menopausal status: premenopausal.  HRT use: 0 years. Colonoscopy: no; not examined. Mammogram within the last year: no. Number of breast biopsies: 0. Up to date with pelvic exams:  Prior had been three years previous. Any excessive radiation exposure in the past:  no  Past Medical History  Diagnosis Date  . Heart attack   . Ovarian cancer 2016    right ovary    History  reviewed. No pertinent past surgical history.  History   Social History  . Marital Status: Single    Spouse Name: N/A  . Number of Children: N/A  . Years of Education: N/A   Social History Main Topics  . Smoking status: Former Smoker -- 1.00 packs/day for 8 years    Types: Cigarettes    Quit date: 08/19/2011  . Smokeless tobacco: Not on file  . Alcohol Use: 0.0 oz/week    0 Standard drinks or equivalent per week     Comment: soc - maybe 3 drinks per month  . Drug Use: Not on file  . Sexual Activity: Not on file   Other Topics Concern  . None   Social History Narrative     FAMILY HISTORY:  We obtained a detailed, 4-generation family history.  Significant diagnoses are listed below: Family History  Problem Relation Age of Onset  . Colon polyps Father     about 2-3 removed following colonoscopy  . Irritable bowel syndrome Brother     1 previous colonoscopy - no polyps found  . Breast cancer Paternal Aunt 16    "low level" breast cancer  . Prostate cancer Maternal Grandfather 57  . Lymphoma Maternal Grandfather 71    large B-cell lymphoma  . Heart Problems Paternal Grandmother   . Heart attack Paternal Grandfather   . Cancer Other 14    unknown type    Shelley Vega has no children currently.  She has one brother who has a history irritable bowel syndrome and one normal colonoscopy.  Shelley Vega mother is 50 and is cancer and polyp-free.  Shelley Vega father is currently 13 and had about 2-3 polyps removed upon a previous colonoscopy.  Shelley Vega has one paternal uncle and one paternal aunt.  Her paternal uncle is 69 and cancer-free.  Her paternal aunt was diagnosed with breast cancer at the age of 63.  Neither her paternal aunt or uncle have any children.  Shelley Vega paternal grandfather passed away from a heart attack at 27; her paternal grandmother passed away from heart issues at 40.  Shelley Vega has one maternal aunt who is 69 and cancer-free.  This aunt has one son, age 8,  and one daughter, age 33.  Shelley Vega maternal grandfather was diagnosed with prostate cancer at 72 and with large B-cell lymphoma at 96.  He passed away at 83.  He had one brother with an unknown type of cancer that was found upon his death at age 60.  Shelley Vega maternal grandfather had one additional brother and three additional sisters--none of whom had cancer.  His parents did not have cancer.  Shelley Vega maternal grandmother is alive and cancer-free at 81.  None of her three siblings or parents had cancer.  Shelley Vega and her mother are not aware of any additional cancer diagnoses for any family members on either the maternal or paternal sides of the family.  Patient's maternal ancestors are of [place]gl[place] descent, and paternal ancestors are of [place] and [place] descent. There is no reported Ashkenazi Jewish ancestry. There is no known consanguinity.  GENETIC COUNSELING ASSESSMENT: Shelley Vega is a 34 y.o. female with a personal history of ovarian cancer which somewhat suggestive of a hereditary cancer syndrome and predisposition to cancer. We, therefore, discussed and recommended the following at today's visit.   DISCUSSION: We reviewed the characteristics, features and inheritance patterns of hereditary cancer syndromes, particularly those caused by changes within the BRCA1/2 and Lynch syndrome genes. We also discussed genetic testing, including the appropriate family members to test, the process of testing, insurance coverage and turn-around-time for results. We discussed the implications of a negative, positive and/or variant of uncertain significant result. We recommended Shelley Vega pursue genetic testing for the 21-gene Breast/Ovarian Cancer Panel through [facility] Hope Pigeon, MD).   Based on Shelley Vega. Carnero's personal history of cancer, she meets medical criteria for genetic testing. Despite that she meets criteria, she may still have an out of pocket cost. We discussed  that if her out of pocket cost for testing is over $100, the laboratory will call and confirm whether she wants to proceed with testing.  If the out of pocket cost of testing is less than $100 she will be billed by the genetic testing laboratory.   PLAN: After considering the risks, benefits, and limitations, Shelley Vega  provided informed consent to pursue genetic testing and the blood sample was sent to [facility] for analysis of the 21-gene Breast/Ovarian Cancer Panel test.  The Breast/Ovarian gene panel offered by [facility] includes sequencing and deletion/duplication analysis for the following 20 genes:  ATM, BARD1, BRCA1, BRCA2, BRIP1, CDH1, CHEK2, FANCC, MLH1, MSH2, MSH6, NBN, PALB2, PMS2, PTEN, RAD51C, RAD51D, STK11, TP53, and XRCC2.  This panel also includes deletion/duplication analysis (without sequencing) for one gene, EPCAM.  Results should be available within approximately 2-3 weeks' time, at which point they will be disclosed by telephone to Shelley Vega, as  will any additional recommendations warranted by these results. Shelley Vega. Scalisi will receive a summary of her genetic counseling visit and a copy of her results once available. This information will also be available in Epic. We encouraged Shelley Vega. Braddy to remain in contact with cancer genetics annually so that we can continuously update the family history and inform her of any changes in cancer genetics and testing that may be of benefit for her family. Shelley Vega. Culliton questions were answered to her satisfaction today. Our contact information was provided should additional questions or concerns arise.  Thank you for the referral and allowing [place] to share in the care of your patient.   Jeanine Luz, Shelley Vega Genetic Counselor [email or web address] Phone: [phone]  The patient was seen for a total of 45 minutes in face-to-face genetic counseling.  This patient was discussed with Drs. Magrinat, Lindi Adie and/or Burr Medico who agrees with the above.     _______________________________________________________________________ For Office Staff:  Number of people involved in session: 2 Was an Intern/ student involved with case: no

## 2014-08-03 ENCOUNTER — Other Ambulatory Visit: Payer: Self-pay | Admitting: Gynecologic Oncology

## 2014-08-03 NOTE — Progress Notes (Signed)
Labs per [facility]. 

## 2014-08-09 ENCOUNTER — Other Ambulatory Visit: Payer: Self-pay | Admitting: *Deleted

## 2014-08-09 ENCOUNTER — Other Ambulatory Visit (HOSPITAL_BASED_OUTPATIENT_CLINIC_OR_DEPARTMENT_OTHER): Payer: BC Managed Care – PPO

## 2014-08-09 DIAGNOSIS — C561 Malignant neoplasm of right ovary: Secondary | ICD-10-CM | POA: Diagnosis not present

## 2014-08-09 LAB — CBC WITH DIFFERENTIAL/PLATELET
BASO%: 0.2 % (ref 0.0–2.0)
Basophils Absolute: 0 10*3/uL (ref 0.0–0.1)
EOS ABS: 0.3 10*3/uL (ref 0.0–0.5)
EOS%: 5.3 % (ref 0.0–7.0)
HEMATOCRIT: 34.6 % — AB (ref 34.8–46.6)
HEMOGLOBIN: 11.6 g/dL (ref 11.6–15.9)
LYMPH%: 21.1 % (ref 14.0–49.7)
MCH: 28.8 pg (ref 25.1–34.0)
MCHC: 33.5 g/dL (ref 31.5–36.0)
MCV: 85.9 fL (ref 79.5–101.0)
MONO#: 0.5 10*3/uL (ref 0.1–0.9)
MONO%: 9.3 % (ref 0.0–14.0)
NEUT%: 64.1 % (ref 38.4–76.8)
NEUTROS ABS: 3.4 10*3/uL (ref 1.5–6.5)
PLATELETS: 237 10*3/uL (ref 145–400)
RBC: 4.03 10*6/uL (ref 3.70–5.45)
RDW: 13.6 % (ref 11.2–14.5)
WBC: 5.3 10*3/uL (ref 3.9–10.3)
lymph#: 1.1 10*3/uL (ref 0.9–3.3)

## 2014-08-09 LAB — COMPREHENSIVE METABOLIC PANEL (CC13)
ALK PHOS: 81 U/L (ref 40–150)
ALT: 19 U/L (ref 0–55)
AST: 15 U/L (ref 5–34)
Albumin: 3.8 g/dL (ref 3.5–5.0)
Anion Gap: 7 mEq/L (ref 3–11)
BILIRUBIN TOTAL: 0.43 mg/dL (ref 0.20–1.20)
BUN: 10.9 mg/dL (ref 7.0–26.0)
CALCIUM: 8.9 mg/dL (ref 8.4–10.4)
CHLORIDE: 106 meq/L (ref 98–109)
CO2: 24 meq/L (ref 22–29)
Creatinine: 0.7 mg/dL (ref 0.6–1.1)
EGFR: >90 mL/min/{1.73_m2} (ref >90)
Glucose: 97 mg/dl (ref 70–140)
Potassium: 3.9 mEq/L (ref 3.5–5.1)
SODIUM: 137 meq/L (ref 136–145)
TOTAL PROTEIN: 7.1 g/dL (ref 6.4–8.3)

## 2014-08-09 LAB — PHOSPHORUS: PHOSPHORUS: 3.7 mg/dL (ref 2.3–4.6)

## 2014-08-09 LAB — MAGNESIUM (CC13): MAGNESIUM: 1.9 mg/dL (ref 1.5–2.5)

## 2014-08-09 MED ORDER — LIDOCAINE-PRILOCAINE 2.5-2.5 % EX CREA: 1.0000 "application " | TOPICAL_CREAM | CUTANEOUS | Status: DC | PRN

## 2014-08-16 ENCOUNTER — Other Ambulatory Visit: Payer: BC Managed Care – PPO

## 2014-08-17 ENCOUNTER — Telehealth: Payer: Self-pay | Admitting: Genetic Counselor

## 2014-08-17 NOTE — Telephone Encounter (Signed)
Left Shelley Vega a message that I good news regarding her test results.  If she could give me a call back at my office.

## 2014-08-21 ENCOUNTER — Telehealth: Payer: Self-pay | Admitting: Genetic Counselor

## 2014-08-21 ENCOUNTER — Ambulatory Visit: Payer: Self-pay | Admitting: Genetic Counselor

## 2014-08-21 DIAGNOSIS — C561 Malignant neoplasm of right ovary: Secondary | ICD-10-CM

## 2014-08-21 DIAGNOSIS — Z803 Family history of malignant neoplasm of breast: Secondary | ICD-10-CM | POA: Insufficient documentation

## 2014-08-21 DIAGNOSIS — Z1379 Encounter for other screening for genetic and chromosomal anomalies: Secondary | ICD-10-CM | POA: Insufficient documentation

## 2014-08-21 DIAGNOSIS — Z809 Family history of malignant neoplasm, unspecified: Secondary | ICD-10-CM

## 2014-08-21 NOTE — Telephone Encounter (Signed)
Discussed with Shelley Vega that her genetic test results were negative for changes within any of 21 genes that would cause her to be at an increased risk for ovarian, and other related cancers.  Additionally, no uncertain changes were found.  We still do not have an explanation for why Shelley Vega was diagnosed with her ovarian cancer and at such a young age.  We reviewed that most cancers happen by chance, but that a negative result cannot rule out a genetic cause simply because we might have even better/more comprehensive testing available to [place] in the future.  Thus, she is always welcome to call us back in the future to find out about updated testing options.  She can also call us to let us know if there are any updates to the family history.  Future cancer screening for Shelley Vega and her family members would then be based on the personal and family history of cancer.  She should follow the cancer screening recommendations of her primary care and oncology providers.

## 2014-08-21 NOTE — Progress Notes (Signed)
GENETIC TEST RESULTS  HPI: Ms. Huether was previously seen in the [place] clinic with her mother due to a personal history of ovarian cancer at 76, family history of cancer, and concerns regarding a hereditary predisposition to cancer. Please refer to our prior cancer genetics clinic note from August 02, 2014 for more information regarding Ms. Farnam's medical, social and family histories, and our assessment and recommendations, at the time. Ms. Shorty recent genetic test results were disclosed to her, as were recommendations warranted by these results. These results and recommendations are discussed in more detail below.  GENETIC TEST RESULTS: At the time of Ms. Semidey visit on 08/02/14, we recommended she pursue genetic testing of the 21-gene Breast/Ovarian Cancer Panel through [facility] Hope Pigeon, MD).  The Breast/Ovarian gene panel offered by [facility] includes sequencing and deletion/duplication analysis for the following 2 genes:  ATM, BARD1, BRCA1, BRCA2, BRIP1, CDH1, CHEK2, FANCC, MLH1, MSH2, MSH6, NBN, PALB2, PMS2, PTEN, RAD51C, RAD51D, STK11, TP53, and XRCC2.  This panel also includes deletion/duplication analysis (without sequencing) for one gene, EPCAM.  Those results are now back, the report date of which is August 16, 2014.  Genetic testing was normal, and did not reveal a deleterious mutation in these genes.  Additionally, no variants of uncertain significance (VUSs) were found.  The test report will be scanned into EPIC and will be located under the Media tab.   We discussed with Ms. Mokry that since the current genetic testing is not perfect, it is possible there may be a gene mutation in one of these genes that current testing cannot detect, but that chance is small. We also discussed, that it is possible that another gene that has not yet been discovered, or that we have not yet tested, is responsible for the cancer diagnoses in the family, and it is, therefore,  important to remain in touch with cancer genetics in the future so that we can continue to offer Ms. Felber the most up to date genetic testing.   CANCER SCREENING RECOMMENDATIONS: While we still do not have an explanation for Ms. Vandergrift's ovarian cancer diagnosis, this result is reassuring and indicates that Ms. Crespo likely does not have an increased risk for a future cancer due to a mutation in one of these genes. This normal test also suggests that Ms. Sahr's cancer was most likely not due to an inherited predisposition associated with one of these genes.  Most cancers happen by chance and this negative test suggests that her cancer falls into this category.  We, therefore, recommended she continue to follow the cancer management and screening guidelines provided by her oncology and primary healthcare providers.   RECOMMENDATIONS FOR FAMILY MEMBERS: Women in this family might be at some increased risk of developing cancer, over the general population risk, simply due to the family history of cancer. We recommended women in this family have a yearly mammogram beginning at age 32, or 64 years younger than the earliest onset of cancer (still 42 based on the history of breast cancer diagnosed in Ms. Flock's paternal aunt at 70), an an annual clinical breast exam, and perform monthly breast self-exams. Women in this family should also have a gynecological exam as recommended by their primary provider. All family members should have a colonoscopy by age 68.  FOLLOW-UP: Lastly, we discussed with Ms. Andujo that cancer genetics is a rapidly advancing field and it is possible that new genetic tests will be appropriate for her and/or her family members  in the future. We encouraged her to remain in contact with cancer genetics on an annual basis so we can update her personal and family histories and let her know of advances in cancer genetics that may benefit this family.   Our contact number was provided. Ms.  Steines questions were answered to her satisfaction, and she knows she is welcome to call us at anytime with additional questions or concerns.   Jeanine Luz, MS Genetic Counselor [email or web address] Phone: [phone]

## 2014-08-23 ENCOUNTER — Other Ambulatory Visit: Payer: Self-pay | Admitting: Gynecologic Oncology

## 2014-08-23 ENCOUNTER — Other Ambulatory Visit: Payer: Self-pay | Admitting: *Deleted

## 2014-08-23 ENCOUNTER — Other Ambulatory Visit (HOSPITAL_BASED_OUTPATIENT_CLINIC_OR_DEPARTMENT_OTHER): Payer: BC Managed Care – PPO

## 2014-08-23 ENCOUNTER — Telehealth: Payer: Self-pay | Admitting: *Deleted

## 2014-08-23 DIAGNOSIS — R300 Dysuria: Secondary | ICD-10-CM

## 2014-08-23 DIAGNOSIS — C561 Malignant neoplasm of right ovary: Secondary | ICD-10-CM

## 2014-08-23 DIAGNOSIS — B379 Candidiasis, unspecified: Secondary | ICD-10-CM

## 2014-08-23 DIAGNOSIS — R8290 Unspecified abnormal findings in urine: Secondary | ICD-10-CM

## 2014-08-23 DIAGNOSIS — T3695XA Adverse effect of unspecified systemic antibiotic, initial encounter: Principal | ICD-10-CM

## 2014-08-23 DIAGNOSIS — N390 Urinary tract infection, site not specified: Secondary | ICD-10-CM

## 2014-08-23 LAB — URINALYSIS, MICROSCOPIC - CHCC
Bilirubin (Urine): NEGATIVE
Blood: NEGATIVE
Glucose: NEGATIVE mg/dL
Ketones: NEGATIVE mg/dL
Nitrite: POSITIVE
SPECIFIC GRAVITY, URINE: 1.02 (ref 1.003–1.035)
Urobilinogen, UR: 0.2 mg/dL (ref 0.2–1)
pH: 6 (ref 4.6–8.0)

## 2014-08-23 LAB — CBC WITH DIFFERENTIAL/PLATELET
BASO%: 0.2 % (ref 0.0–2.0)
Basophils Absolute: 0 10*3/uL (ref 0.0–0.1)
EOS ABS: 0.2 10*3/uL (ref 0.0–0.5)
EOS%: 3.4 % (ref 0.0–7.0)
HCT: 35.3 % (ref 34.8–46.6)
HEMOGLOBIN: 11.6 g/dL (ref 11.6–15.9)
LYMPH#: 1.3 10*3/uL (ref 0.9–3.3)
LYMPH%: 29.6 % (ref 14.0–49.7)
MCH: 28.4 pg (ref 25.1–34.0)
MCHC: 32.9 g/dL (ref 31.5–36.0)
MCV: 86.3 fL (ref 79.5–101.0)
MONO#: 0.5 10*3/uL (ref 0.1–0.9)
MONO%: 10.2 % (ref 0.0–14.0)
NEUT%: 56.6 % (ref 38.4–76.8)
NEUTROS ABS: 2.5 10*3/uL (ref 1.5–6.5)
PLATELETS: 195 10*3/uL (ref 145–400)
RBC: 4.09 10*6/uL (ref 3.70–5.45)
RDW: 13.6 % (ref 11.2–14.5)
WBC: 4.4 10*3/uL (ref 3.9–10.3)

## 2014-08-23 LAB — MAGNESIUM (CC13): Magnesium: 1.9 mg/dl (ref 1.5–2.5)

## 2014-08-23 LAB — PHOSPHORUS: PHOSPHORUS: 4.3 mg/dL (ref 2.5–4.5)

## 2014-08-23 MED ORDER — FLUCONAZOLE 100 MG PO TABS: 100.0000 mg | ORAL_TABLET | Freq: Once | ORAL | Status: DC

## 2014-08-23 MED ORDER — CIPROFLOXACIN HCL 500 MG PO TABS: 500.0000 mg | ORAL_TABLET | Freq: Two times a day (BID) | ORAL | Status: DC

## 2014-08-23 NOTE — Telephone Encounter (Signed)
Per Joylene John, NP, patient notified of urinalysis results and prescription sent in for patient to start Cipro 500mg  tablets BID for 7 days. Told patient that our office reviewed previous urine culture done at [facility] on 07-15-14 and at that time the bacteria was sensitive to Cipro. She reports completing two courses of Macrobid previously and she states every time she completes the antibiotic her urinary symptoms return.    Told patient that once the urine culture results are back we will notify her of the results. Patient agreeable to pickup prescription today. Notified patient that if she notices any tenderness or pain in her achilles tendon to stop taking the medication and informed of the risk of tendonitis and achilles tendon rupture with Cipro. Also, instructed patient to not take the diflucan tablet prescribed until after she completes this course of antibiotics and only if it is needed at that time - patient states understanding and is agreeable to this. No other questions or concerns noted at this time.

## 2014-08-23 NOTE — Progress Notes (Signed)
Patient presents to the lab to have labs drawn for [facility].  Receiving chemo at [facility].  Requesting to have a urine sample obtained due to foul smelling urine and dysuria.  Stating she has taken Macrobid at the end of June for a UTI and then took diflucan after for a yeast infection.  Urine ordered.  Will follow up on results and address with antibiotic coverage if needed.  [facility] notified of situation.

## 2014-08-26 LAB — URINE CULTURE

## 2014-08-27 NOTE — Telephone Encounter (Signed)
Per Joylene John, NP patient notified that the urine culture results are back and that the bacteria is sensitive to Cipro and pt instructed to complete antibiotic course prescribed on 08-23-14. Patient agreeable to this and appreciative of the call.

## 2014-08-30 ENCOUNTER — Other Ambulatory Visit (HOSPITAL_BASED_OUTPATIENT_CLINIC_OR_DEPARTMENT_OTHER): Payer: BC Managed Care – PPO

## 2014-08-30 DIAGNOSIS — C561 Malignant neoplasm of right ovary: Secondary | ICD-10-CM | POA: Diagnosis not present

## 2014-08-30 LAB — COMPREHENSIVE METABOLIC PANEL (CC13)
ALT: 21 U/L (ref 0–55)
ANION GAP: 7 meq/L (ref 3–11)
AST: 12 U/L (ref 5–34)
Albumin: 3.4 g/dL — ABNORMAL LOW (ref 3.5–5.0)
Alkaline Phosphatase: 89 U/L (ref 40–150)
BILIRUBIN TOTAL: 0.21 mg/dL (ref 0.20–1.20)
BUN: 13.7 mg/dL (ref 7.0–26.0)
CALCIUM: 8.8 mg/dL (ref 8.4–10.4)
CHLORIDE: 109 meq/L (ref 98–109)
CO2: 26 mEq/L (ref 22–29)
CREATININE: 0.7 mg/dL (ref 0.6–1.1)
EGFR: >90 mL/min/{1.73_m2} (ref >90)
Glucose: 103 mg/dl (ref 70–140)
Potassium: 4.1 mEq/L (ref 3.5–5.1)
Sodium: 141 mEq/L (ref 136–145)
Total Protein: 6.6 g/dL (ref 6.4–8.3)

## 2014-08-30 LAB — CBC WITH DIFFERENTIAL/PLATELET
BASO%: 1.1 % (ref 0.0–2.0)
Basophils Absolute: 0 10*3/uL (ref 0.0–0.1)
EOS%: 1.4 % (ref 0.0–7.0)
Eosinophils Absolute: 0.1 10*3/uL (ref 0.0–0.5)
HCT: 36.1 % (ref 34.8–46.6)
HEMOGLOBIN: 11.9 g/dL (ref 11.6–15.9)
LYMPH#: 1.3 10*3/uL (ref 0.9–3.3)
LYMPH%: 36.5 % (ref 14.0–49.7)
MCH: 28.3 pg (ref 25.1–34.0)
MCHC: 32.9 g/dL (ref 31.5–36.0)
MCV: 85.9 fL (ref 79.5–101.0)
MONO#: 0.7 10*3/uL (ref 0.1–0.9)
MONO%: 20.7 % — AB (ref 0.0–14.0)
NEUT#: 1.4 10*3/uL — ABNORMAL LOW (ref 1.5–6.5)
NEUT%: 40.3 % (ref 38.4–76.8)
Platelets: 288 10*3/uL (ref 145–400)
RBC: 4.2 10*6/uL (ref 3.70–5.45)
RDW: 14.7 % — AB (ref 11.2–14.5)
WBC: 3.5 10*3/uL — ABNORMAL LOW (ref 3.9–10.3)

## 2014-08-30 LAB — PHOSPHORUS: Phosphorus: 3.4 mg/dL (ref 2.5–4.5)

## 2014-08-30 LAB — MAGNESIUM (CC13): Magnesium: 1.9 mg/dl (ref 1.5–2.5)

## 2014-09-06 ENCOUNTER — Other Ambulatory Visit: Payer: BC Managed Care – PPO

## 2014-09-09 ENCOUNTER — Other Ambulatory Visit: Payer: Self-pay | Admitting: Gynecologic Oncology

## 2014-09-13 ENCOUNTER — Other Ambulatory Visit (HOSPITAL_BASED_OUTPATIENT_CLINIC_OR_DEPARTMENT_OTHER): Payer: BC Managed Care – PPO

## 2014-09-13 ENCOUNTER — Other Ambulatory Visit: Payer: BC Managed Care – PPO

## 2014-09-13 DIAGNOSIS — C561 Malignant neoplasm of right ovary: Secondary | ICD-10-CM | POA: Diagnosis not present

## 2014-09-13 LAB — CBC WITH DIFFERENTIAL/PLATELET
BASO%: 0.3 % (ref 0.0–2.0)
Basophils Absolute: 0 10*3/uL (ref 0.0–0.1)
EOS%: 1.8 % (ref 0.0–7.0)
Eosinophils Absolute: 0.1 10*3/uL (ref 0.0–0.5)
HCT: 35.9 % (ref 34.8–46.6)
HGB: 11.8 g/dL (ref 11.6–15.9)
LYMPH%: 25.3 % (ref 14.0–49.7)
MCH: 28.4 pg (ref 25.1–34.0)
MCHC: 32.8 g/dL (ref 31.5–36.0)
MCV: 86.4 fL (ref 79.5–101.0)
MONO#: 0.5 10*3/uL (ref 0.1–0.9)
MONO%: 9.3 % (ref 0.0–14.0)
NEUT#: 3.1 10*3/uL (ref 1.5–6.5)
NEUT%: 63.3 % (ref 38.4–76.8)
Platelets: 68 10*3/uL — ABNORMAL LOW (ref 145–400)
RBC: 4.15 10*6/uL (ref 3.70–5.45)
RDW: 14.8 % — ABNORMAL HIGH (ref 11.2–14.5)
WBC: 5 10*3/uL (ref 3.9–10.3)
lymph#: 1.3 10*3/uL (ref 0.9–3.3)

## 2014-09-13 LAB — MAGNESIUM (CC13): Magnesium: 2.1 mg/dl (ref 1.5–2.5)

## 2014-09-13 LAB — PHOSPHORUS: Phosphorus: 3.3 mg/dL (ref 2.5–4.5)

## 2014-09-16 ENCOUNTER — Emergency Department (HOSPITAL_COMMUNITY)
Admission: EM | Admit: 2014-09-16 | Discharge: 2014-09-16 | Disposition: A | Discharge status: Home / Self Care | Payer: BC Managed Care – PPO | Source: Home / Self Care | Attending: Family Medicine | Admitting: Family Medicine

## 2014-09-16 ENCOUNTER — Encounter (HOSPITAL_COMMUNITY): Payer: Self-pay | Admitting: Emergency Medicine

## 2014-09-16 DIAGNOSIS — T148 Other injury of unspecified body region: Secondary | ICD-10-CM | POA: Diagnosis not present

## 2014-09-16 DIAGNOSIS — W5503XA Scratched by cat, initial encounter: Secondary | ICD-10-CM

## 2014-09-16 MED ORDER — AZITHROMYCIN 250 MG PO TABS: ORAL_TABLET | ORAL | Status: DC

## 2014-09-16 MED ORDER — FLUCONAZOLE 150 MG PO TABS: 150.0000 mg | ORAL_TABLET | Freq: Every day | ORAL | Status: DC

## 2014-09-16 NOTE — ED Provider Notes (Signed)
CSN: [id number]     Arrival date & time 09/16/14  1830 History   None    Chief Complaint  Patient presents with  . Animal Bite   (Consider location/radiation/quality/duration/timing/severity/associated sxs/prior Treatment) Patient is a 34 y.o. female presenting with animal bite. The history is provided by the patient. No language interpreter was used.  Animal Bite Contact animal:  Cat (Patient was scratched by her house cat 1-1/2 hours before this encounter,, she was not bitten.  The cat jumped on her while she was typing at desk, claw reached the left side of her neck.  Cat is an indoor cat only; is up to date on vaccines. ) Location:  Head/neck (left side of neck. ) Head/neck injury location:  Neck Time since incident:  90 minutes Pain details:    Quality: not painful now. She presents as a precautionary measure in setting of cat scratch, pt on chemotx for ovarian cancer.  Patient was scratched, not bitten by her house cat.  Patient is UTD on Td vaccine.  Her cat is UTD on vaccinations.    Past Medical History  Diagnosis Date  . Heart attack   . 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 No pertinent past surgical history. Family History  Problem Relation Age of Onset  . Colon polyps Father     about 2-3 removed following colonoscopy  . Irritable bowel syndrome Brother     1 previous colonoscopy - no polyps found  . Breast cancer Paternal Aunt 49    "low level" breast cancer  . Prostate cancer Maternal Grandfather 24  . Lymphoma Maternal Grandfather 71    large B-cell lymphoma  . Heart Problems Paternal Grandmother   . Heart attack Paternal Grandfather   . Cancer Other 86    unknown type   Social History  Substance Use Topics  . Smoking status: Former Smoker -- 1.00 packs/day for 8 years    Types: Cigarettes    Quit date: 08/19/2011  . Smokeless tobacco: None  . Alcohol Use: 0.0 oz/week    0 Standard drinks or equivalent per week     Comment: soc -  maybe 3 drinks per month   OB History    No data available     Review of Systems  Allergies  Lisinopril  Home Medications   Prior to Admission medications   Medication Sig Start Date End Date Taking? Authorizing Provider  amphetamine-dextroamphetamine (ADDERALL) 30 MG tablet Take 30 mg by mouth every morning.   Yes Historical Provider, MD  aspirin 81 MG tablet Take 81 mg by mouth daily.   Yes Historical Provider, MD  atorvastatin (LIPITOR) 40 MG tablet Take 40 mg by mouth daily.   Yes Historical Provider, MD  cetirizine (ZYRTEC) 10 MG tablet Take 10 mg by mouth daily.   Yes Historical Provider, MD  losartan (COZAAR) 25 MG tablet Take 25 mg by mouth daily.   Yes Historical Provider, MD  metoprolol succinate (TOPROL-XL) 100 MG 24 hr tablet Take 100 mg by mouth daily. Take with or immediately following a meal.   Yes Historical Provider, MD  montelukast (SINGULAIR) 10 MG tablet Take 10 mg by mouth daily.   Yes Historical Provider, MD  prasugrel (EFFIENT) 10 MG TABS tablet Take 10 mg by mouth daily.   Yes Historical Provider, MD  albuterol (PROVENTIL HFA;VENTOLIN HFA) 108 (90 BASE) MCG/ACT inhaler Inhale 2 puffs into the lungs every 6 (six) hours as needed for wheezing or shortness  of breath.    Historical Provider, MD  amphetamine-dextroamphetamine (ADDERALL) 20 MG tablet Take 20 mg by mouth daily with lunch.    Historical Provider, MD  ciprofloxacin (CIPRO) 500 MG tablet Take 1 tablet (500 mg total) by mouth 2 (two) times daily. 08/23/14   Dorothyann Gibbs, NP  docusate sodium (COLACE) 100 MG capsule Take 100 mg by mouth as needed.  07/16/14   Historical Provider, MD  enoxaparin (LOVENOX) 40 MG/0.4ML injection Inject 40 mg into the skin. 07/16/14   Historical Provider, MD  fluconazole (DIFLUCAN) 100 MG tablet Take 1 tablet (100 mg total) by mouth once. 08/23/14   Dorothyann Gibbs, NP  fluticasone (FLONASE) 50 MCG/ACT nasal spray Place 2 sprays into both nostrils daily as needed for allergies or  rhinitis.    Historical Provider, MD  gabapentin (NEURONTIN) 100 MG capsule Take 100 mg by mouth. 07/16/14 07/16/15  Historical Provider, MD  guanFACINE (TENEX) 1 MG tablet TAKE 1 TABLET BY MOUTH EVERY NIGHT AT BEDTIME 09/21/13   Historical Provider, MD  HYDROmorphone (DILAUDID) 4 MG tablet Take 1 tablet (4 mg total) by mouth every 4 (four) hours as needed for severe pain. 07/25/14   Nancy Marus, MD  lidocaine-prilocaine (EMLA) cream Apply 1 application topically as needed. 08/09/14   Dorothyann Gibbs, NP  metroNIDAZOLE (METROGEL) 0.75 % vaginal gel  06/15/14   Historical Provider, MD  nitroGLYCERIN (NITROSTAT) 0.4 MG SL tablet Place 0.4 mg under the tongue every 5 (five) minutes as needed for chest pain.    Historical Provider, MD   Meds Ordered and Administered this Visit  Medications - No data to display  BP 148/95 mmHg  Pulse 94  Temp(Src) 98.1 F (36.7 C) (Oral)  Resp 16  SpO2 99%  LMP 08/27/2014 No data found.   Physical Exam  Constitutional: She appears well-developed and well-nourished. No distress.  HENT:  Head: Normocephalic.  0.5cm x 1.75cm erythematous mark along left side of patient's neck.  No tenderness to palpate. Smaller (<0.5cm each) circular marks above the primary (larger) abrasion noted above. Full active ROM in all planes. No cervical adenopathy. No expression of pus or bleeding from wound in center of primary lesion.   Eyes: Conjunctivae are normal.  Neck: Normal range of motion. Neck supple.  Lymphadenopathy:    She has no cervical adenopathy.  Skin: She is not diaphoretic.    ED Course  Procedures (including critical care time)  Labs Review Labs Reviewed - No data to display  Imaging Review No results found.   Visual Acuity Review  Right Eye Distance:   Left Eye Distance:   Bilateral Distance:    Right Eye Near:   Left Eye Near:    Bilateral Near:         MDM   1. Cat scratch        Willeen Niece, MD 09/16/14 [phone]

## 2014-09-16 NOTE — ED Notes (Signed)
The patient presented with a complaint of a scratch from a cat. The patient stated that her cat was attempting to jump in her lap and scratched her neck. The patient had an obvious puncture to the left side of her neck that was starting to swell and was red and irritated. The patient stated that she is currently undergoing chemotherapy for Ovarian Ca and was concerned about possible infection.

## 2014-09-16 NOTE — Discharge Instructions (Signed)
It was a pleasure to see you today for the cat scratch to the left side of your neck.   I am prescribing azithromycin 250mg  tablets, take 2 tablets by mouth on Day One, then 1 tablet daily for the next 4 days.  Keep wound clean and covered.   Return or call if worsening swelling or pain, if you develop a fever, or if the wound expresses pus or develops wider margin of redness.

## 2014-09-20 ENCOUNTER — Other Ambulatory Visit: Payer: BC Managed Care – PPO

## 2014-09-20 ENCOUNTER — Other Ambulatory Visit (HOSPITAL_BASED_OUTPATIENT_CLINIC_OR_DEPARTMENT_OTHER): Payer: BC Managed Care – PPO

## 2014-09-20 DIAGNOSIS — C561 Malignant neoplasm of right ovary: Secondary | ICD-10-CM | POA: Diagnosis not present

## 2014-09-20 LAB — COMPREHENSIVE METABOLIC PANEL (CC13)
ALT: 21 U/L (ref 0–55)
ANION GAP: 10 meq/L (ref 3–11)
AST: 15 U/L (ref 5–34)
Albumin: 3.8 g/dL (ref 3.5–5.0)
Alkaline Phosphatase: 88 U/L (ref 40–150)
BILIRUBIN TOTAL: 0.35 mg/dL (ref 0.20–1.20)
BUN: 11.4 mg/dL (ref 7.0–26.0)
CO2: 24 meq/L (ref 22–29)
CREATININE: 0.7 mg/dL (ref 0.6–1.1)
Calcium: 9.1 mg/dL (ref 8.4–10.4)
Chloride: 107 mEq/L (ref 98–109)
EGFR: >90 mL/min/{1.73_m2} (ref >90)
GLUCOSE: 88 mg/dL (ref 70–140)
Potassium: 3.9 mEq/L (ref 3.5–5.1)
SODIUM: 141 meq/L (ref 136–145)
TOTAL PROTEIN: 7 g/dL (ref 6.4–8.3)

## 2014-09-20 LAB — PHOSPHORUS: PHOSPHORUS: 3.5 mg/dL (ref 2.5–4.5)

## 2014-09-20 LAB — CBC WITH DIFFERENTIAL/PLATELET
BASO%: 0 % (ref 0.0–2.0)
Basophils Absolute: 0 10*3/uL (ref 0.0–0.1)
EOS%: 0.6 % (ref 0.0–7.0)
Eosinophils Absolute: 0 10*3/uL (ref 0.0–0.5)
HEMATOCRIT: 32.4 % — AB (ref 34.8–46.6)
HGB: 11 g/dL — ABNORMAL LOW (ref 11.6–15.9)
LYMPH#: 1.2 10*3/uL (ref 0.9–3.3)
LYMPH%: 37.1 % (ref 14.0–49.7)
MCH: 29.3 pg (ref 25.1–34.0)
MCHC: 34 g/dL (ref 31.5–36.0)
MCV: 86.4 fL (ref 79.5–101.0)
MONO#: 0.4 10*3/uL (ref 0.1–0.9)
MONO%: 12.2 % (ref 0.0–14.0)
NEUT#: 1.7 10*3/uL (ref 1.5–6.5)
NEUT%: 50.1 % (ref 38.4–76.8)
PLATELETS: 98 10*3/uL — AB (ref 145–400)
RBC: 3.75 10*6/uL (ref 3.70–5.45)
RDW: 15.1 % — ABNORMAL HIGH (ref 11.2–14.5)
WBC: 3.3 10*3/uL — AB (ref 3.9–10.3)

## 2014-09-20 LAB — MAGNESIUM (CC13): Magnesium: 1.9 mg/dl (ref 1.5–2.5)

## 2014-09-25 ENCOUNTER — Other Ambulatory Visit (HOSPITAL_BASED_OUTPATIENT_CLINIC_OR_DEPARTMENT_OTHER): Payer: BC Managed Care – PPO

## 2014-09-25 ENCOUNTER — Other Ambulatory Visit: Payer: Self-pay | Admitting: Nurse Practitioner

## 2014-09-25 DIAGNOSIS — C561 Malignant neoplasm of right ovary: Secondary | ICD-10-CM

## 2014-09-25 DIAGNOSIS — C569 Malignant neoplasm of unspecified ovary: Secondary | ICD-10-CM

## 2014-09-25 LAB — CBC WITH DIFFERENTIAL/PLATELET
BASO%: 0.4 % (ref 0.0–2.0)
Basophils Absolute: 0 10*3/uL (ref 0.0–0.1)
EOS ABS: 0 10*3/uL (ref 0.0–0.5)
EOS%: 0.6 % (ref 0.0–7.0)
HCT: 33.4 % — ABNORMAL LOW (ref 34.8–46.6)
HEMOGLOBIN: 11.1 g/dL — AB (ref 11.6–15.9)
LYMPH%: 28.7 % (ref 14.0–49.7)
MCH: 29.3 pg (ref 25.1–34.0)
MCHC: 33.3 g/dL (ref 31.5–36.0)
MCV: 88 fL (ref 79.5–101.0)
MONO#: 0.4 10*3/uL (ref 0.1–0.9)
MONO%: 9.6 % (ref 0.0–14.0)
NEUT%: 60.7 % (ref 38.4–76.8)
NEUTROS ABS: 2.5 10*3/uL (ref 1.5–6.5)
PLATELETS: 196 10*3/uL (ref 145–400)
RBC: 3.79 10*6/uL (ref 3.70–5.45)
RDW: 15.6 % — AB (ref 11.2–14.5)
WBC: 4 10*3/uL (ref 3.9–10.3)
lymph#: 1.2 10*3/uL (ref 0.9–3.3)

## 2014-09-25 NOTE — Progress Notes (Signed)
Lab order placed per Joylene John, NP for STAT CBC w diff per [facility] request. Called patient and left apt details on vm. Requested she call back to confirm at [phone].

## 2014-09-27 ENCOUNTER — Other Ambulatory Visit: Payer: BC Managed Care – PPO

## 2014-10-03 ENCOUNTER — Other Ambulatory Visit (HOSPITAL_BASED_OUTPATIENT_CLINIC_OR_DEPARTMENT_OTHER): Payer: BC Managed Care – PPO

## 2014-10-03 ENCOUNTER — Other Ambulatory Visit: Payer: Self-pay | Admitting: Nurse Practitioner

## 2014-10-03 ENCOUNTER — Telehealth: Payer: Self-pay | Admitting: Nurse Practitioner

## 2014-10-03 ENCOUNTER — Encounter: Payer: Self-pay | Admitting: Nurse Practitioner

## 2014-10-03 DIAGNOSIS — C561 Malignant neoplasm of right ovary: Secondary | ICD-10-CM

## 2014-10-03 DIAGNOSIS — C569 Malignant neoplasm of unspecified ovary: Secondary | ICD-10-CM

## 2014-10-03 LAB — COMPREHENSIVE METABOLIC PANEL (CC13)
ALBUMIN: 4.1 g/dL (ref 3.5–5.0)
ALK PHOS: 74 U/L (ref 40–150)
ALT: 57 U/L — AB (ref 0–55)
ANION GAP: 12 meq/L — AB (ref 3–11)
AST: 31 U/L (ref 5–34)
BUN: 23.2 mg/dL (ref 7.0–26.0)
CALCIUM: 10.2 mg/dL (ref 8.4–10.4)
CHLORIDE: 103 meq/L (ref 98–109)
CO2: 22 mEq/L (ref 22–29)
CREATININE: 0.7 mg/dL (ref 0.6–1.1)
EGFR: >90 mL/min/{1.73_m2} (ref >90)
Glucose: 103 mg/dl (ref 70–140)
POTASSIUM: 3.4 meq/L — AB (ref 3.5–5.1)
Sodium: 138 mEq/L (ref 136–145)
Total Bilirubin: 0.32 mg/dL (ref 0.20–1.20)
Total Protein: 7.4 g/dL (ref 6.4–8.3)

## 2014-10-03 LAB — CBC WITH DIFFERENTIAL/PLATELET
BASO%: 0 % (ref 0.0–2.0)
BASOS ABS: 0 10*3/uL (ref 0.0–0.1)
EOS ABS: 0 10*3/uL (ref 0.0–0.5)
EOS%: 0.9 % (ref 0.0–7.0)
HCT: 29.5 % — ABNORMAL LOW (ref 34.8–46.6)
HGB: 10.1 g/dL — ABNORMAL LOW (ref 11.6–15.9)
LYMPH%: 41.1 % (ref 14.0–49.7)
MCH: 29.6 pg (ref 25.1–34.0)
MCHC: 34.2 g/dL (ref 31.5–36.0)
MCV: 86.5 fL (ref 79.5–101.0)
MONO#: 0.2 10*3/uL (ref 0.1–0.9)
MONO%: 8.4 % (ref 0.0–14.0)
NEUT#: 1.1 10*3/uL — ABNORMAL LOW (ref 1.5–6.5)
NEUT%: 49.6 % (ref 38.4–76.8)
PLATELETS: 92 10*3/uL — AB (ref 145–400)
RBC: 3.41 10*6/uL — AB (ref 3.70–5.45)
RDW: 16.4 % — ABNORMAL HIGH (ref 11.2–14.5)
WBC: 2.1 10*3/uL — ABNORMAL LOW (ref 3.9–10.3)
lymph#: 0.9 10*3/uL (ref 0.9–3.3)

## 2014-10-03 LAB — HOLD TUBE, BLOOD BANK

## 2014-10-03 LAB — PHOSPHORUS: PHOSPHORUS: 3.7 mg/dL (ref 2.5–4.5)

## 2014-10-03 LAB — MAGNESIUM (CC13): MAGNESIUM: 2 mg/dL (ref 1.5–2.5)

## 2014-10-03 NOTE — Telephone Encounter (Signed)
Calling to make sure patient knows about lab apt that her mother called to schedule earlier today. No answer. Left message on identifying voicemail.

## 2014-10-03 NOTE — Progress Notes (Signed)
Patient in lab waiting for CBC results. Hgb 10.1, Shelley John, NP informed. Based on this value, RBC transfusion not indicated. However, upon speaking with patient in the lab, she reports shortness of breath while walking and talking, vaginal bleeding x14 days and passing clots. She quickly saturates tampons and pads while wearing them simultaneously, requiring multiple changes per hour. Shelley John, NP notified via telephone. Patient informed to report to the ED with changes in mental or respiratory status and persistent bleeding. We will be in touch with her by phone tomorrow as to whether she will receive a transfusion for the above symptoms and additional symptom management recommendations. She informed of possible blood transfusion 10/05/14 at 9:30. Mother is with patient at this time, they both verbalize understanding of the above.

## 2014-10-03 NOTE — Progress Notes (Signed)
Lab orders entered per Joylene John, NP CBC, Cmet, Mg, Phos, Type and Screen, and hold tube. Per Nadine Counts at [facility], patient called their facility reporting dizziness; we will check labs in anticipation of RBC transfusion. Lab apt made, patient aware. Sharyn Lull, chemo scheduler, gave available tx time 10/05/14 at 9:30. RN to call [facility].

## 2014-10-03 NOTE — Telephone Encounter (Signed)
Called patient mother who made lab apt earlier today to ensure patient is aware. She states they are here at [facility] now.

## 2014-10-04 ENCOUNTER — Other Ambulatory Visit: Payer: BC Managed Care – PPO

## 2014-10-04 ENCOUNTER — Ambulatory Visit: Payer: BC Managed Care – PPO | Attending: Gynecologic Oncology | Admitting: Gynecologic Oncology

## 2014-10-04 ENCOUNTER — Telehealth: Payer: Self-pay | Admitting: Nurse Practitioner

## 2014-10-04 VITALS — BP 98/55 | HR 107 | Temp 98.2°F | Resp 18 | Ht 63.0 in | Wt 190.2 lb

## 2014-10-04 DIAGNOSIS — R0602 Shortness of breath: Secondary | ICD-10-CM | POA: Diagnosis not present

## 2014-10-04 DIAGNOSIS — N939 Abnormal uterine and vaginal bleeding, unspecified: Secondary | ICD-10-CM | POA: Diagnosis not present

## 2014-10-04 DIAGNOSIS — R109 Unspecified abdominal pain: Secondary | ICD-10-CM

## 2014-10-04 MED ORDER — NORETHIN-ETH ESTRAD TRIPHASIC 0.5/0.75/1-35 MG-MCG PO TABS: ORAL_TABLET | ORAL | Status: DC

## 2014-10-04 MED ORDER — OXYCODONE HCL 5 MG PO TABS: 5.0000 mg | ORAL_TABLET | ORAL | Status: DC | PRN

## 2014-10-04 NOTE — Telephone Encounter (Signed)
RN calling to check on patient. She took the day off work and has been resting. She reports feeling very tired. Lightheaded on standing up and short of breath with ambulation but not at rest. She reports continued vaginal bleeding. Per Dr. Alycia Rossetti, this may be a heavy period (patients uterus and L ovary remain after surgery), and she can see patient in clinic at [facility] on Monday. Patient would like to see MD at [facility] as she has not seen her since surgery. However, patient remains concerned about being day 15 of bleeding, after having normal, predictable periods since surgery, and passing large clots. Per Joylene John, NP, patient added to schedule at [facility] clinic for evaluation. She is aware of today's apt at 3:00 and is on the way with her mother.

## 2014-10-04 NOTE — Progress Notes (Signed)
Follow Up Note: Gyn-Onc  Shelley Vega 34 y.o. female  CC:  Chief Complaint  Patient presents with  . Ovarian Cancer  . Vaginal Bleeding    Follow up    HPI:  Shelley Vega is a 34 y.o. woman who presented with odor with urination, vaginal discharge, and STD testing. She reported regular periods without intermenstrual bleeding but reported heavier cycles for the past couple of months. She was subsequently diagnosed with a large abdominal mass. Of note, patient's cardiologist is Dr. Nolberto Hanlon [phone]) in [place] associated with [place].  On 07/13/14 at [facility], she underwent surgery including:   Oncology Summary:   Malignant neoplasm of right ovary (RAF-HCC)    07/13/2014  Initial Diagnosis  Malignant neoplasm of right ovary (RAF-HCC)    07/13/2014  Surgery  Exploratory laparotomy with right salpingoophorectomy, right ureterolysis, infracolic omentectomy, right pelvic and aortic lymph node dissection, and staging biopsies     Findings: 1) 20cm right adnexal mass adherent to the mesentery and right pelvic sidewall - frozen section evaluation showing at least borderline tumor with extensive calcifications 2) Retroperitoneal fibrosis requiring right ureterolysis 3) Normal abdominal survey with normal appendix, large bowel, small bowel (run from terminal ileum to ligament of Treitz), smooth diaphragms bilaterally, smooth liver edge, normal appearing omentum and stomach 4) Normal appearing left tube and ovary 5) Normal uterus          Final Diagnosis:  A: Ovary, right, oophorectomy  - 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 (please see synoptic report and comment)  B: Pelvic adhesion, excision  - Involved by serous carcinoma, largest focus measures 0.7 cm  C: Omentum, omentectomy  - Involved by serous carcinoma, focus measures 0.3 cm  D: Peritoneum, pelvic nodule,  excision  - No serous carcinoma identified  - Fibroadipose tissue with chronic inflammation and focal calcification  E: Lymph nodes, pelvic, right, resection  - Nine lymph nodes negative for metastatic carcinoma (0/9)  F: Peritoneum, posterior adhesion, biopsy  - No serous carcinoma identified  - Fibrovascular tissue with hemorrhage  G: Lymph node, para-aortic, excision  - Four lymph nodes negative for metastatic carcinoma (0/4)  H: Peritoneum, gutter, right, biopsy  - No serous carcinoma identified  - Fibroadipose tissue with chronic inflammation  I: Peritoneum, gutter, left, biopsy  - No serous carcinoma identified  - Fibroadipose tissue  She has been receiving carboplatin and taxol under the care of Dr. Nancy Marus at [facility].  Cycle 1 began on 08/13/14, and her last cycle, #3, was given on 09/26/14.  Her CA 125 was 11.6 at her first cycle, 7.7 with the second, and 8.3 with the last.  Lab work including a CBC, CMET, Mag, and Phos were ordered on 10/03/14 for evaluation of hemoglobin and hematocrit due to heavy uterine bleeding, symptoms of lightheadedness and shortness of breath to see if a transfusion would be necessary.      Interval History:  Shelley Vega presents today with her mother for evaluation of heavy uterine bleeding and shortness of breath on exertion.  She reports having two to three normal, predictable periods after surgery.  During her last cycle of Taxol (#3) on 09/26/14, she was supposed to be at the end of her menstrual cycle but she experienced a sharp lower abdominal pain/cramp during her treatment and the bleeding continued heavily after that time.  Abdominal cramping is relieved with the use of Percocet.  She felt  that the bleeding was going to taper off on Sunday, but the cramping and bleeding picked back up again.  She has been bleeding for 16 days.  She has been using 2 super tampons an hour and states they are not totally saturated but are covered in dark red blood clots.   She is also going through 4 to 5 maxi pads a day, wearing tampons and using pads simultaneously.  She has been working through chemotherapy as a Pharmacist, hospital.  She reports working the day after her treatment and experiences dizziness with position changes.  The dizziness has lessened but she continues to experience shortness of breath on exertion.  She noticed shortness of breath when walking from her house to the car and from her car at work.  The shortness of breath has been gradual and she is not sure if it is from the blood loss or a cumulative effect of the chemotherapy.  Her appetite has improved and she has gained weight.  Denies fever, chills, chest pain or discomfort at rest or with inspiration.  She reports palpable lymph nodes under her left axilla has resolved.  She also reports her BP today is lower than her normal and she continues to take losartan daily.  Overall, she has been tolerating chemotherapy well.  She has been sleeping more recently and states when she gets home she goes to sleep right away, which is different from her normal routine.  See below for a more detailed review of systems.       Review of Systems  Constitutional: Feels tired.  Appetite adequate.  No early satiety, unintentional weight loss.  Positive for weight gain and fatigue.    Cardiovascular: Positive for shortness of breath on exertion.  No chest pain or edema.  Pulmonary: No cough or wheeze.  Gastrointestinal: No nausea, vomiting, or diarrhea. No bright red blood per rectum or change in bowel movement.  Intermittent constipation.  Genitourinary: Positive for significant vaginal bleeding.  No frequency, urgency, or dysuria. No vaginal discharge.  Musculoskeletal: No myalgia or joint pain. Neurologic: No weakness, numbness, or change in gait.  Psychology: No depression, anxiety, or insomnia.  Current Meds:  Outpatient Encounter Prescriptions as of 10/04/2014  Medication Sig  . amphetamine-dextroamphetamine (ADDERALL)  30 MG tablet Take 30 mg by mouth every morning.  Marland Kitchen aspirin 81 MG chewable tablet CHEW  AND SWALLOW 1 TABLET PO QD  . atorvastatin (LIPITOR) 40 MG tablet Take 40 mg by mouth daily.  . cetirizine (ZYRTEC) 10 MG tablet Take 10 mg by mouth daily.  . clindamycin (CLEOCIN-T) 1 % external solution Apply to affected area 2 times daily  . losartan (COZAAR) 25 MG tablet Take 25 mg by mouth daily.  . metoprolol succinate (TOPROL-XL) 100 MG 24 hr tablet Take 100 mg by mouth daily. Take with or immediately following a meal.  . montelukast (SINGULAIR) 10 MG tablet Take 10 mg by mouth.  . nitroGLYCERIN (NITROSTAT) 0.4 MG SL tablet Place 0.4 mg under the tongue every 5 (five) minutes as needed for chest pain.  Marland Kitchen ondansetron (ZOFRAN) 8 MG tablet   . oxyCODONE-acetaminophen (ROXICET) 5-325 MG per tablet Take by mouth.  . prasugrel (EFFIENT) 10 MG TABS tablet Take 10 mg by mouth daily.  . prochlorperazine (COMPAZINE) 10 MG tablet Take 10 mg by mouth.  Marland Kitchen albuterol (PROVENTIL HFA;VENTOLIN HFA) 108 (90 BASE) MCG/ACT inhaler Inhale 2 puffs into the lungs every 6 (six) hours as needed for wheezing or shortness of breath.  Marland Kitchen  amphetamine-dextroamphetamine (ADDERALL) 20 MG tablet Take 20 mg by mouth daily with lunch.  . docusate sodium (COLACE) 100 MG capsule Take 100 mg by mouth as needed.   . fluconazole (DIFLUCAN) 100 MG tablet   . fluconazole (DIFLUCAN) 150 MG tablet Take 1 tablet (150 mg total) by mouth daily. (Patient not taking: Reported on 10/04/2014)  . fluticasone (FLONASE) 50 MCG/ACT nasal spray Place 2 sprays into both nostrils daily as needed for allergies or rhinitis.  Marland Kitchen gabapentin (NEURONTIN) 100 MG capsule Take 100 mg by mouth.  . guanFACINE (TENEX) 1 MG tablet TAKE 1 TABLET BY MOUTH EVERY NIGHT AT BEDTIME  . HYDROmorphone (DILAUDID) 4 MG tablet Take 1 tablet (4 mg total) by mouth every 4 (four) hours as needed for severe pain. (Patient not taking: Reported on 10/04/2014)  . lidocaine-prilocaine (EMLA)  cream Apply 1 application topically as needed. (Patient not taking: Reported on 10/04/2014)  . metroNIDAZOLE (METROGEL) 0.75 % vaginal gel   . norethindrone-ethinyl estradiol (CYCLAFEM,ALYACEN) 0.5/0.75/1-35 MG-MCG tablet Take 4 pills for 4 days, then 3 pills for 3 days, then 2 pills for 2 days and then one a day until you finish the second pack.  Marland Kitchen oxyCODONE (OXY IR/ROXICODONE) 5 MG immediate release tablet Take 1-2 tablets (5-10 mg total) by mouth every 4 (four) hours as needed for severe pain.  . [DISCONTINUED] aspirin 81 MG tablet Take 81 mg by mouth daily.  . [DISCONTINUED] azithromycin (ZITHROMAX) 250 MG tablet Take 2 tablets by mouth on Day One (today), then 1 tablet daily for 4 more days. (Patient not taking: Reported on 10/04/2014)  . [DISCONTINUED] ciprofloxacin (CIPRO) 500 MG tablet Take 1 tablet (500 mg total) by mouth 2 (two) times daily. (Patient not taking: Reported on 10/04/2014)  . [DISCONTINUED] enoxaparin (LOVENOX) 40 MG/0.4ML injection Inject 40 mg into the skin.  . [DISCONTINUED] montelukast (SINGULAIR) 10 MG tablet Take 10 mg by mouth daily.  . [DISCONTINUED] ondansetron (ZOFRAN) 8 MG tablet Take 8 mg by mouth.   No facility-administered encounter medications on file as of 10/04/2014.    Allergy:  Allergies  Allergen Reactions  . Lisinopril Cough    Social Hx:   Social History   Social History  . Marital Status: Single    Spouse Name: N/A  . Number of Children: N/A  . Years of Education: N/A   Occupational History  . Not on file.   Social History Main Topics  . Smoking status: Former Smoker -- 1.00 packs/day for 8 years    Types: Cigarettes    Quit date: 08/19/2011  . Smokeless tobacco: Not on file  . Alcohol Use: 0.0 oz/week    0 Standard drinks or equivalent per week     Comment: soc - maybe 3 drinks per month  . Drug Use: Not on file  . Sexual Activity: Not on file   Other Topics Concern  . Not on file   Social History Narrative    Past Surgical  Hx: No past surgical history on file.  Past Medical Hx:  Past Medical History  Diagnosis Date  . Heart attack   . Ovarian cancer 2016    right ovary    Family Hx:  Family History  Problem Relation Age of Onset  . Colon polyps Father     about 2-3 removed following colonoscopy  . Irritable bowel syndrome Brother     1 previous colonoscopy - no polyps found  . Breast cancer Paternal Aunt 10    "low level" breast cancer  .  Prostate cancer Maternal Grandfather 76  . Lymphoma Maternal Grandfather 71    large B-cell lymphoma  . Heart Problems Paternal Grandmother   . Heart attack Paternal Grandfather   . Cancer Other 76    unknown type    CBC    Component Value Date/Time   WBC 2.1* 10/03/2014 1548   RBC 3.41* 10/03/2014 1548   HGB 10.1* 10/03/2014 1548   HCT 29.5* 10/03/2014 1548   PLT 92* 10/03/2014 1548   MCV 86.5 10/03/2014 1548   MCH 29.6 10/03/2014 1548   MCHC 34.2 10/03/2014 1548   RDW 16.4* 10/03/2014 1548   LYMPHSABS 0.9 10/03/2014 1548   MONOABS 0.2 10/03/2014 1548   EOSABS 0.0 10/03/2014 1548   BASOSABS 0.0 10/03/2014 1548   CMP Latest Ref Rng 10/03/2014 09/20/2014 08/30/2014  Glucose 70 - 140 mg/dl 103 88 103  BUN 7.0 - 26.0 mg/dL 23.2 11.4 13.7  Creatinine 0.6 - 1.1 mg/dL 0.7 0.7 0.7  Sodium 136 - 145 mEq/L 138 141 141  Potassium 3.5 - 5.1 mEq/L 3.4(L) 3.9 4.1  CO2 22 - 29 mEq/L 22 24 26   Calcium 8.4 - 10.4 mg/dL 10.2 9.1 8.8  Total Protein 6.4 - 8.3 g/dL 7.4 7.0 6.6  Total Bilirubin 0.20 - 1.20 mg/dL 0.32 0.35 0.21  Alkaline Phos 40 - 150 U/L 74 88 89  AST 5 - 34 U/L 31 15 12   ALT 0 - 55 U/L 57(H) 21 21     Vitals:  Blood pressure 98/55, pulse 107, temperature 98.2 F (36.8 C), temperature source Oral, resp. rate 18, height 5\' 3"  (1.6 m), weight 190 lb 3.2 oz (86.274 kg), last menstrual period 08/27/2014, SpO2 97 %.  Physical Exam:  General: Well developed, well nourished female in no acute distress. Alert and oriented x 3.  Neck: Supple without  any enlargements.  Lymph node survey: No cervical, supraclavicular, axillary, or inguinal adenopathy.  Cardiovascular: Regular rate and rhythm. S1 and S2 normal.  Lungs: Clear to auscultation bilaterally. No wheezes/crackles/rhonchi noted.  Skin: No rashes or lesions present. Back: No CVA tenderness.  Abdomen: Abdomen soft, non-tender and obese. Active bowel sounds in all quadrants. No evidence of a fluid wave or abdominal masses.  Abdominal incision well healed with no evidence of herniation.  Genitourinary:    Vulva/vagina: Normal external female genitalia. No lesions.    Urethra: No lesions or masses.    Vagina: No lesions. No palpable masses.  Small amount of dark red blood in the upper vagina.   Extremities: No bilateral cyanosis, edema, or clubbing.   Assessment/Plan:  34 year old s/p exploratory laparotomy with right salpingoophorectomy, right ureterolysis, infracolic omentectomy, right pelvic and aortic lymph node dissection, and staging biopsies on 07/15/14 with Dr. 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  Uterus and left ovary/fallopian tube remain since patient wanted to preserve fertility.  She has completed cycle three of carboplatin/taxol on 09/26/14.  Pelvic examination today unremarkable except for minimal amount of dark red blood clots in the vagina.  Hgb stable at 10.1 and Hct 29.5 with no need for transfusion at this time.  Potassium slightly low at 3.4.  Increasing potassium rich foods for three to four days discussed.  Oxygen saturation 100% at rest and stayed at 97-100% when ambulating for several minutes in the hallway.  Orthostatic vital signs negative but values lower than her normal.  Situation discussed with Dr. Alycia Rossetti at [facility].  Patient is going to follow up at [facility]  on Monday, Sept 19 with Dr. Alycia Rossetti.  Reportable signs and symptoms reviewed.  She is advised to begin taking Ortho-Novum 777: 4 pills for 4 days, 3 pills for 3 days, 2 pills for 2 days and then  one a day until she finishes the second pack. Then she can stay on the OCPs. Advised this will not hurt her cancer and may help protect the other ovary and will hopefully help with the bleeding per Dr. Alycia Rossetti.  Patient is advised to continue taking aspirin and Effient under the care of Dr. Kurtis Bushman.  She is advised to incorporate Miralax once or twice daily if constipation occurs since she recently began taking iron supplementation.  She is also advised to monitor her BP and to keep a diary of her pressures.  Fall precautions discussed if dizziness with position changes occur.  She is advised to call for any questions or concerns.  All questions answered.  Plan discussed with patient and mother.    Cheryal Salas DEAL, NP 10/05/2014, 4:21 PM

## 2014-10-04 NOTE — Patient Instructions (Signed)
Pelvic exam unremarkable today.  Follow up with Dr. Alycia Rossetti on Monday at [facility].  If the shortness of breath worsens, chest pain develops, swelling in one leg more than the other seek care at the Emergency Room over the weekend.  Take 4 pills for 4 days, 3 pills for 3 days, 2 pills for 2 days and then one a day until she finishes the second pack then stay on the OCPs. This will not hurt your cancer and may help protect the other ovary and will hopefully help with the bleeding.  Continue taking your iron and incorporate potassium rich foods for several days.  Ethinyl Estradiol; Norethindrone tablets What is this medicine? ETHINYL ESTRADIOL; NORETHINDRONE (ETH in il es tra DYE ole; nor eth IN drone) is an oral contraceptive. The products combine two types of female hormones, an estrogen and a progestin. They are used to prevent ovulation and pregnancy. This medicine may be used for other purposes; ask your health care provider or pharmacist if you have questions. COMMON BRAND NAME(S): Bary Richard, Brevicon, BRIELLYN, Cyclafem 1/35, Cyclafem 7/7/7, DASETTA, [place], [place], Modicon, Necon 0.5/35, Necon 1/35, Necon 10/11, Necon 7/7/7, Norinyl 1/35, Nortrel 0.5/35, Nortrel 1/35, Nortrel 7/7/7, Ortho-Novum 1/35, Ortho-Novum 10/11, Ortho-Novum 7/7/7, Ovcon 71, Ovcon 44, PHILITH, Pirmella, Tri-Norinyl, Vyfemla, WERA, [place] What should I tell my health care provider before I take this medicine? They need to know if you have or ever had any of these conditions: -abnormal vaginal bleeding -blood vessel disease or blood clots -breast, cervical, endometrial, ovarian, liver, or uterine cancer -diabetes -gallbladder disease -heart disease or recent heart attack -high blood pressure -high cholesterol -kidney disease -liver disease -migraine headaches -stroke -systemic lupus erythematosus (SLE) -tobacco smoker -an unusual or allergic reaction to estrogens, progestins, other medicines, foods,  dyes, or preservatives -pregnant or trying to get pregnant -breast-feeding How should I use this medicine? Take this medicine by mouth. To reduce nausea, this medicine may be taken with food. Follow the directions on the prescription label. Take this medicine at the same time each day and in the order directed on the package. Do not take your medicine more often than directed. A patient package insert for the product will be given with each prescription and refill. Read this sheet carefully each time. The sheet may change frequently. Contact your pediatrician regarding the use of this medicine in children. Special care may be needed. This medicine has been used in female children who have started having menstrual periods. Overdosage: If you think you have taken too much of this medicine contact a poison control center or emergency room at once. NOTE: This medicine is only for you. Do not share this medicine with others. What if I miss a dose? If you miss a dose, refer to the patient information sheet you received with your medicine for direction. If you miss more than one pill, this medicine may not be as effective and you may need to use another form of birth control. What may interact with this medicine? -acetaminophen -antibiotics or medicines for infections, especially rifampin, rifabutin, rifapentine, and griseofulvin, and possibly penicillins or tetracyclines -aprepitant -ascorbic acid (vitamin C) -atorvastatin -barbiturate medicines, such as phenobarbital -bosentan -carbamazepine -caffeine -clofibrate -cyclosporine -dantrolene -doxercalciferol -felbamate -grapefruit juice -hydrocortisone -medicines for anxiety or sleeping problems, such as diazepam or temazepam -medicines for diabetes, including pioglitazone -mineral oil -modafinil -mycophenolate -nefazodone -oxcarbazepine -phenytoin -prednisolone -ritonavir or other medicines for HIV infection or  AIDS -rosuvastatin -selegiline -soy isoflavones supplements -St. John's wort -tamoxifen or raloxifene -  theophylline -thyroid hormones -topiramate -warfarin This list may not describe all possible interactions. Give your health care provider a list of all the medicines, herbs, non-prescription drugs, or dietary supplements you use. Also tell them if you smoke, drink alcohol, or use illegal drugs. Some items may interact with your medicine. What should I watch for while using this medicine? Visit your doctor or health care professional for regular checks on your progress. You will need a regular breast and pelvic exam and Pap smear while on this medicine. Use an additional method of contraception during the first cycle that you take these tablets. If you have any reason to think you are pregnant, stop taking this medicine right away and contact your doctor or health care professional. If you are taking this medicine for hormone related problems, it may take several cycles of use to see improvement in your condition. Smoking increases the risk of getting a blood clot or having a stroke while you are taking birth control pills, especially if you are more than 34 years old. You are strongly advised not to smoke. This medicine can make your body retain fluid, making your fingers, hands, or ankles swell. Your blood pressure can go up. Contact your doctor or health care professional if you feel you are retaining fluid. This medicine can make you more sensitive to the sun. Keep out of the sun. If you cannot avoid being in the sun, wear protective clothing and use sunscreen. Do not use sun lamps or tanning beds/booths. If you wear contact lenses and notice visual changes, or if the lenses begin to feel uncomfortable, consult your eye care specialist. In some women, tenderness, swelling, or minor bleeding of the gums may occur. Notify your dentist if this happens. Brushing and flossing your teeth regularly  may help limit this. See your dentist regularly and inform your dentist of the medicines you are taking. If you are going to have elective surgery, you may need to stop taking this medicine before the surgery. Consult your health care professional for advice. This medicine does not protect you against HIV infection (AIDS) or any other sexually transmitted diseases. What side effects may I notice from receiving this medicine? Side effects that you should report to your doctor or health care professional as soon as possible: -allergic reactions like skin rash, itching or hives, swelling of the face, lips, or tongue -breast tissue changes or discharge -changes in vaginal bleeding during your period or between your periods -chest pain -coughing up blood -dizziness or fainting spells -headaches or migraines -leg, arm or groin pain -problems with balance, talking, walking -severe or sudden headaches -severe stomach pain -sudden shortness of breath -symptoms of vaginal infection like itching, irritation or unusual discharge -tenderness in the upper abdomen -vomiting -weakness or numbness in the arms or legs, especially on one side of the body -yellowing of the eyes or skin Side effects that usually do not require medical attention (report to your doctor or health care professional if they continue or are bothersome): -breakthrough bleeding and spotting that continues beyond the 3 initial cycles of pills -breast tenderness -mood changes, anxiety, depression, frustration, anger, or emotional outbursts -increased sensitivity to sun or ultraviolet light -nausea -skin rash, acne, or brown spots on the skin -slight weight gain This list may not describe all possible side effects. Call your doctor for medical advice about side effects. You may report side effects to FDA at 1-800-FDA-1088. Where should I keep my medicine? Keep out of the reach  of children. Store at room temperature between 15 and  30 degrees C (59 and 86 degrees F). Throw away any unused medicine after the expiration date. NOTE: This sheet is a summary. It may not cover all possible information. If you have questions about this medicine, talk to your doctor, pharmacist, or health care provider.  2015, Elsevier/Gold Standard. (2007-12-22 13:29:07)

## 2014-10-10 ENCOUNTER — Telehealth: Payer: Self-pay | Admitting: Gynecologic Oncology

## 2014-10-10 NOTE — Telephone Encounter (Signed)
Patient called the office reporting significant edema that began over the weekend.  Reporting pitting edema of the lower extremities (edema equal bilaterally), facial edema, and eyelid edema.  Denies difficulty breathing, throat swelling etc.  She reports improvement in the shortness of breath and states she thinks it is because she is just tired.  Concerned about the edema.  Dr. Alycia Rossetti in the office and situation discussed with her.  The uterine bleeding has stopped since being on the BCP taper.  Dr. Alycia Rossetti stating she could stop the [facility] but she will begin to bleed again.  Patient asking about fluid pills to decrease the edema.  Dr. Alycia Rossetti not wanting to prescribe fluid pills at this time.  Patient wanting to monitor the edema and continue taking the BCPs.  Asking about natural diuretics.  She is to call the office in one to two days with an update.  Informed of CT scan results from [facility].  Reportable signs and symptoms reviewed.  She is to call the office for any questions or concerns.

## 2014-10-11 ENCOUNTER — Other Ambulatory Visit (HOSPITAL_BASED_OUTPATIENT_CLINIC_OR_DEPARTMENT_OTHER): Payer: BC Managed Care – PPO

## 2014-10-11 ENCOUNTER — Other Ambulatory Visit: Payer: BC Managed Care – PPO

## 2014-10-11 DIAGNOSIS — C569 Malignant neoplasm of unspecified ovary: Secondary | ICD-10-CM

## 2014-10-11 DIAGNOSIS — C561 Malignant neoplasm of right ovary: Secondary | ICD-10-CM

## 2014-10-11 LAB — CBC WITH DIFFERENTIAL/PLATELET
BASO%: 0.5 % (ref 0.0–2.0)
Basophils Absolute: 0 10*3/uL (ref 0.0–0.1)
EOS%: 0.5 % (ref 0.0–7.0)
Eosinophils Absolute: 0 10*3/uL (ref 0.0–0.5)
HCT: 25.5 % — ABNORMAL LOW (ref 34.8–46.6)
HEMOGLOBIN: 8.5 g/dL — AB (ref 11.6–15.9)
LYMPH#: 0.7 10*3/uL — AB (ref 0.9–3.3)
LYMPH%: 36.2 % (ref 14.0–49.7)
MCH: 29.9 pg (ref 25.1–34.0)
MCHC: 33.3 g/dL (ref 31.5–36.0)
MCV: 89.8 fL (ref 79.5–101.0)
MONO#: 0.4 10*3/uL (ref 0.1–0.9)
MONO%: 22.1 % — ABNORMAL HIGH (ref 0.0–14.0)
NEUT%: 40.7 % (ref 38.4–76.8)
NEUTROS ABS: 0.8 10*3/uL — AB (ref 1.5–6.5)
Platelets: 99 10*3/uL — ABNORMAL LOW (ref 145–400)
RBC: 2.84 10*6/uL — ABNORMAL LOW (ref 3.70–5.45)
RDW: 20.1 % — AB (ref 11.2–14.5)
WBC: 2 10*3/uL — AB (ref 3.9–10.3)
nRBC: 0 % (ref 0–0)

## 2014-10-11 LAB — COMPREHENSIVE METABOLIC PANEL (CC13)
ALBUMIN: 3.8 g/dL (ref 3.5–5.0)
ALK PHOS: 59 U/L (ref 40–150)
ALT: 18 U/L (ref 0–55)
ANION GAP: 9 meq/L (ref 3–11)
AST: 16 U/L (ref 5–34)
BILIRUBIN TOTAL: 0.43 mg/dL (ref 0.20–1.20)
BUN: 11.5 mg/dL (ref 7.0–26.0)
CALCIUM: 9.5 mg/dL (ref 8.4–10.4)
CO2: 23 mEq/L (ref 22–29)
CREATININE: 0.7 mg/dL (ref 0.6–1.1)
Chloride: 106 mEq/L (ref 98–109)
EGFR: >90 mL/min/{1.73_m2} (ref >90)
Glucose: 109 mg/dl (ref 70–140)
Potassium: 3.9 mEq/L (ref 3.5–5.1)
Sodium: 137 mEq/L (ref 136–145)
Total Protein: 7.2 g/dL (ref 6.4–8.3)

## 2014-10-11 LAB — MAGNESIUM (CC13): MAGNESIUM: 1.9 mg/dL (ref 1.5–2.5)

## 2014-10-11 LAB — PHOSPHORUS: Phosphorus: 2.7 mg/dL (ref 2.5–4.5)

## 2014-10-11 LAB — HOLD TUBE, BLOOD BANK

## 2014-10-12 ENCOUNTER — Other Ambulatory Visit: Payer: Self-pay | Admitting: Gynecologic Oncology

## 2014-10-12 ENCOUNTER — Telehealth: Payer: Self-pay | Admitting: Gynecologic Oncology

## 2014-10-12 DIAGNOSIS — C569 Malignant neoplasm of unspecified ovary: Secondary | ICD-10-CM

## 2014-10-12 DIAGNOSIS — D709 Neutropenia, unspecified: Secondary | ICD-10-CM

## 2014-10-12 NOTE — Telephone Encounter (Signed)
Patient returned call to the office.  Message left earlier asking the patient to call.  Advised to lab appt on Wed.  Advised to begin neutropenic precautions.  Precautions discussed and reviewed.  She is wanting to come in on Monday for repeat labs including lipid panel per her cardiologist.  Orders placed.  She is advised to call for any questions or concerns.

## 2014-10-15 ENCOUNTER — Other Ambulatory Visit (HOSPITAL_BASED_OUTPATIENT_CLINIC_OR_DEPARTMENT_OTHER): Payer: BC Managed Care – PPO

## 2014-10-15 DIAGNOSIS — C561 Malignant neoplasm of right ovary: Secondary | ICD-10-CM | POA: Diagnosis not present

## 2014-10-15 DIAGNOSIS — D709 Neutropenia, unspecified: Secondary | ICD-10-CM

## 2014-10-15 DIAGNOSIS — C569 Malignant neoplasm of unspecified ovary: Secondary | ICD-10-CM

## 2014-10-15 LAB — PHOSPHORUS: Phosphorus: 3.5 mg/dL (ref 2.5–4.5)

## 2014-10-15 LAB — CBC WITH DIFFERENTIAL/PLATELET
BASO%: 0.4 % (ref 0.0–2.0)
Basophils Absolute: 0 10*3/uL (ref 0.0–0.1)
EOS%: 0.6 % (ref 0.0–7.0)
Eosinophils Absolute: 0 10*3/uL (ref 0.0–0.5)
HEMATOCRIT: 26.1 % — AB (ref 34.8–46.6)
HEMOGLOBIN: 8.8 g/dL — AB (ref 11.6–15.9)
LYMPH#: 1.1 10*3/uL (ref 0.9–3.3)
LYMPH%: 20.2 % (ref 14.0–49.7)
MCH: 31.2 pg (ref 25.1–34.0)
MCHC: 33.8 g/dL (ref 31.5–36.0)
MCV: 92.3 fL (ref 79.5–101.0)
MONO#: 0.7 10*3/uL (ref 0.1–0.9)
MONO%: 13.4 % (ref 0.0–14.0)
NEUT%: 65.4 % (ref 38.4–76.8)
NEUTROS ABS: 3.6 10*3/uL (ref 1.5–6.5)
PLATELETS: 100 10*3/uL — AB (ref 145–400)
RBC: 2.82 10*6/uL — ABNORMAL LOW (ref 3.70–5.45)
RDW: 23.2 % — AB (ref 11.2–14.5)
WBC: 5.5 10*3/uL (ref 3.9–10.3)

## 2014-10-15 LAB — LIPID PANEL
CHOL/HDL RATIO: 4.5 ratio (ref <=5.0)
CHOLESTEROL: 107 mg/dL — AB (ref 125–200)
HDL: 24 mg/dL — AB (ref >=46)
LDL CALC: 35 mg/dL (ref <130)
TRIGLYCERIDES: 239 mg/dL — AB (ref <150)
VLDL: 48 mg/dL — AB (ref <30)

## 2014-10-15 LAB — MAGNESIUM (CC13): MAGNESIUM: 1.8 mg/dL (ref 1.5–2.5)

## 2014-10-16 ENCOUNTER — Telehealth: Payer: Self-pay

## 2014-10-16 NOTE — Telephone Encounter (Signed)
Orders received from [place] to contact patient's cardiologist Dr Nolberto Hanlon [facility]: [phone] , Fax : [phone] with Lipid Panel results. Contacted physician's office and left a voice message on the nurses line to update that we would be faxing over the results of the Lipid panel per the patient's request. Results faxed , transmission verification report received back ok.

## 2014-10-17 ENCOUNTER — Other Ambulatory Visit: Payer: BC Managed Care – PPO

## 2014-10-17 ENCOUNTER — Telehealth: Payer: Self-pay | Admitting: Gynecologic Oncology

## 2014-10-17 NOTE — Telephone Encounter (Signed)
Patient called stating last pm around 10:30 or 11pm, she noticed a bright red blood clot from her vagina.  No cramping or other symptoms like having a period.  She is currently on one BCP a day for the past week.  She was concerned because the bleeding continued so she took 4 BCPs last pm.  This am she noticed a small red spot on her peri-pad but there is nothing new.  Information forwarded to Dr. Alycia Rossetti at [facility] for further recommendations.  The patient stated she does not want to have random periods and wants advice.  Advised she would be contacted with further recommendations from Dr. Alycia Rossetti when available.  Reportable signs and symptoms reviewed.

## 2014-10-17 NOTE — Telephone Encounter (Signed)
Patient called the office about labwork and FMLA paperwork.  Discussed lab results including lipid panel.  Lipid panel results faxed to her cardiologist, Dr. Kurtis Bushman at [facility].  Results also emailed to Dr. Alycia Rossetti.  No concerns voiced.

## 2014-10-18 ENCOUNTER — Other Ambulatory Visit: Payer: BC Managed Care – PPO

## 2014-10-18 ENCOUNTER — Other Ambulatory Visit (HOSPITAL_BASED_OUTPATIENT_CLINIC_OR_DEPARTMENT_OTHER): Payer: BC Managed Care – PPO

## 2014-10-18 DIAGNOSIS — C561 Malignant neoplasm of right ovary: Secondary | ICD-10-CM

## 2014-10-18 DIAGNOSIS — C569 Malignant neoplasm of unspecified ovary: Secondary | ICD-10-CM

## 2014-10-18 DIAGNOSIS — D709 Neutropenia, unspecified: Secondary | ICD-10-CM

## 2014-10-18 LAB — CBC WITH DIFFERENTIAL/PLATELET
BASO%: 0.4 % (ref 0.0–2.0)
BASOS ABS: 0 10*3/uL (ref 0.0–0.1)
EOS%: 0.5 % (ref 0.0–7.0)
Eosinophils Absolute: 0 10*3/uL (ref 0.0–0.5)
HEMATOCRIT: 25.7 % — AB (ref 34.8–46.6)
HEMOGLOBIN: 8.6 g/dL — AB (ref 11.6–15.9)
LYMPH#: 0.9 10*3/uL (ref 0.9–3.3)
LYMPH%: 19 % (ref 14.0–49.7)
MCH: 31.4 pg (ref 25.1–34.0)
MCHC: 33.4 g/dL (ref 31.5–36.0)
MCV: 94.1 fL (ref 79.5–101.0)
MONO#: 0.5 10*3/uL (ref 0.1–0.9)
MONO%: 9.6 % (ref 0.0–14.0)
NEUT#: 3.5 10*3/uL (ref 1.5–6.5)
NEUT%: 70.5 % (ref 38.4–76.8)
PLATELETS: 114 10*3/uL — AB (ref 145–400)
RBC: 2.73 10*6/uL — ABNORMAL LOW (ref 3.70–5.45)
RDW: 26.1 % — AB (ref 11.2–14.5)
WBC: 4.9 10*3/uL (ref 3.9–10.3)

## 2014-10-18 LAB — COMPREHENSIVE METABOLIC PANEL (CC13)
ALBUMIN: 3.6 g/dL (ref 3.5–5.0)
ALK PHOS: 50 U/L (ref 40–150)
ALT: 12 U/L (ref 0–55)
ANION GAP: 6 meq/L (ref 3–11)
AST: 12 U/L (ref 5–34)
BUN: 10.7 mg/dL (ref 7.0–26.0)
CHLORIDE: 108 meq/L (ref 98–109)
CO2: 23 mEq/L (ref 22–29)
Calcium: 9.3 mg/dL (ref 8.4–10.4)
Creatinine: 0.7 mg/dL (ref 0.6–1.1)
Glucose: 93 mg/dl (ref 70–140)
POTASSIUM: 4.2 meq/L (ref 3.5–5.1)
Sodium: 137 mEq/L (ref 136–145)
Total Bilirubin: <0.3 mg/dL (ref 0.20–1.20)
Total Protein: 7 g/dL (ref 6.4–8.3)

## 2014-10-23 NOTE — Telephone Encounter (Signed)
Patient advised of Dr. Elenora Gamma recommendations to take one BCP once daily and to come to [facility] on Monday for an appointment for examination.  Verbalizing understanding.  Reportable signs and symptoms reviewed.  Advised to call for any needs.

## 2014-10-25 ENCOUNTER — Other Ambulatory Visit: Payer: BC Managed Care – PPO

## 2014-10-29 ENCOUNTER — Telehealth: Payer: Self-pay | Admitting: Gynecologic Oncology

## 2014-10-29 NOTE — Telephone Encounter (Signed)
Patient returned called and stated she would like to proceed with the [facility] and IUD placement.  Message sent to Dr. Alycia Rossetti so the procedure could be arranged.  Patient verbalizing understanding that she should be hearing from [facility] about this procedure.  Advised to call for any questions or concerns.

## 2014-10-29 NOTE — Telephone Encounter (Signed)
Message left for patient per her request since she is at work with Dr. Elenora Gamma recommendations:  She will need to do the taper again. We will probably need to do a D&C and place a mirena IUD if she would like to proceed.  Advised to call the office back once she is able to talk.

## 2014-10-29 NOTE — Telephone Encounter (Signed)
Shelley Vega just called and stated yesterday am she began having large bright red clots after starting the placebo pills over the weekend.  She said the clots were as big as an egg and "so bad, so many".  She went ahead and took 4 BCPs yesterday and this am.  She still has moderate bleeding, using tampons and pads simultaneously.  She is also having severe cramping.  She would like to know what she can do to not have a period while she is going through chemo.  Also, does she need to continue with the taper like she did before since she has taken 2 days of 4 pills? Dr. Alycia Rossetti notified at [facility] via email and patient informed she will receive a phone call with her recommendations.

## 2014-10-31 ENCOUNTER — Other Ambulatory Visit: Payer: Self-pay | Admitting: Gynecologic Oncology

## 2014-10-31 ENCOUNTER — Other Ambulatory Visit (HOSPITAL_BASED_OUTPATIENT_CLINIC_OR_DEPARTMENT_OTHER): Payer: BC Managed Care – PPO

## 2014-10-31 DIAGNOSIS — R300 Dysuria: Secondary | ICD-10-CM

## 2014-10-31 DIAGNOSIS — C561 Malignant neoplasm of right ovary: Secondary | ICD-10-CM | POA: Diagnosis not present

## 2014-10-31 LAB — COMPREHENSIVE METABOLIC PANEL (CC13)
ALT: 17 U/L (ref 0–55)
AST: 16 U/L (ref 5–34)
Albumin: 3.7 g/dL (ref 3.5–5.0)
Alkaline Phosphatase: 47 U/L (ref 40–150)
Anion Gap: 9 mEq/L (ref 3–11)
BUN: 13.4 mg/dL (ref 7.0–26.0)
CHLORIDE: 104 meq/L (ref 98–109)
CO2: 22 meq/L (ref 22–29)
Calcium: 9.1 mg/dL (ref 8.4–10.4)
Creatinine: 0.6 mg/dL (ref 0.6–1.1)
EGFR: >90 mL/min/{1.73_m2} (ref >90)
GLUCOSE: 98 mg/dL (ref 70–140)
POTASSIUM: 4 meq/L (ref 3.5–5.1)
SODIUM: 135 meq/L — AB (ref 136–145)
Total Bilirubin: 0.33 mg/dL (ref 0.20–1.20)
Total Protein: 7.1 g/dL (ref 6.4–8.3)

## 2014-10-31 LAB — URINALYSIS, MICROSCOPIC - CHCC
BLOOD: NEGATIVE
Bilirubin (Urine): NEGATIVE
GLUCOSE UR CHCC: NEGATIVE mg/dL
Ketones: NEGATIVE mg/dL
Leukocyte Esterase: NEGATIVE
NITRITE: NEGATIVE
PH: 6 (ref 4.6–8.0)
Protein: NEGATIVE mg/dL
Specific Gravity, Urine: 1.015 (ref 1.003–1.035)
UROBILINOGEN UR: 0.2 mg/dL (ref 0.2–1)

## 2014-10-31 LAB — CBC WITH DIFFERENTIAL/PLATELET
BASO%: 0.3 % (ref 0.0–2.0)
BASOS ABS: 0 10*3/uL (ref 0.0–0.1)
EOS%: 0.3 % (ref 0.0–7.0)
Eosinophils Absolute: 0 10*3/uL (ref 0.0–0.5)
HCT: 25.1 % — ABNORMAL LOW (ref 34.8–46.6)
HGB: 8.4 g/dL — ABNORMAL LOW (ref 11.6–15.9)
LYMPH%: 33.7 % (ref 14.0–49.7)
MCH: 32.4 pg (ref 25.1–34.0)
MCHC: 33.5 g/dL (ref 31.5–36.0)
MCV: 96.9 fL (ref 79.5–101.0)
MONO#: 0.4 10*3/uL (ref 0.1–0.9)
MONO%: 12.8 % (ref 0.0–14.0)
NEUT#: 1.6 10*3/uL (ref 1.5–6.5)
NEUT%: 52.9 % (ref 38.4–76.8)
Platelets: 219 10*3/uL (ref 145–400)
RBC: 2.59 10*6/uL — AB (ref 3.70–5.45)
RDW: 24 % — ABNORMAL HIGH (ref 11.2–14.5)
WBC: 3 10*3/uL — ABNORMAL LOW (ref 3.9–10.3)
lymph#: 1 10*3/uL (ref 0.9–3.3)

## 2014-10-31 MED ORDER — PHENAZOPYRIDINE HCL 95 MG PO TABS: 95.0000 mg | ORAL_TABLET | Freq: Three times a day (TID) | ORAL | Status: DC | PRN

## 2014-10-31 MED ORDER — SULFAMETHOXAZOLE-TRIMETHOPRIM 800-160 MG PO TABS: 1.0000 | ORAL_TABLET | Freq: Two times a day (BID) | ORAL | Status: DC

## 2014-10-31 NOTE — Addendum Note (Signed)
Addended by: Joylene John D on: 10/31/2014 11:29 AM   Modules accepted: Orders

## 2014-10-31 NOTE — Progress Notes (Signed)
Patient called this am having severe dysuria.  She states the dysuria started yesterday.  Denies fever, chills, back pain, hematuria.  UA discussed with the patient in the lobby.  Stating that the burning is moderate and she would like to go ahead and use an antibiotic before having the culture results.  Pyridium and bactrim sent in for the patient.  We will contact her with the results of her culture from today.  She is advised to call the office with an update on her symptoms.

## 2014-11-01 ENCOUNTER — Other Ambulatory Visit: Payer: BC Managed Care – PPO

## 2014-11-01 ENCOUNTER — Telehealth: Payer: Self-pay

## 2014-11-01 LAB — URINE CULTURE

## 2014-11-01 NOTE — Telephone Encounter (Signed)
Orders received from [place] to updated the patient that her U/A Culture was "negative" . No , answer , left a detailed message with results and a request to call back to update [place] with D&C has been scheduled . Our contact information was given.

## 2014-11-08 ENCOUNTER — Other Ambulatory Visit (HOSPITAL_BASED_OUTPATIENT_CLINIC_OR_DEPARTMENT_OTHER): Payer: BC Managed Care – PPO

## 2014-11-08 ENCOUNTER — Other Ambulatory Visit: Payer: BC Managed Care – PPO

## 2014-11-08 DIAGNOSIS — C561 Malignant neoplasm of right ovary: Secondary | ICD-10-CM

## 2014-11-08 LAB — COMPREHENSIVE METABOLIC PANEL (CC13)
ALBUMIN: 3.6 g/dL (ref 3.5–5.0)
ALK PHOS: 46 U/L (ref 40–150)
ALT: 14 U/L (ref 0–55)
ANION GAP: 7 meq/L (ref 3–11)
AST: 14 U/L (ref 5–34)
BUN: 10.8 mg/dL (ref 7.0–26.0)
CALCIUM: 8.4 mg/dL (ref 8.4–10.4)
CO2: 21 mEq/L — ABNORMAL LOW (ref 22–29)
Chloride: 109 mEq/L (ref 98–109)
Creatinine: 0.6 mg/dL (ref 0.6–1.1)
Glucose: 92 mg/dl (ref 70–140)
Potassium: 3.9 mEq/L (ref 3.5–5.1)
Sodium: 137 mEq/L (ref 136–145)
TOTAL PROTEIN: 6.7 g/dL (ref 6.4–8.3)

## 2014-11-08 LAB — CBC WITH DIFFERENTIAL/PLATELET
BASO%: 0.3 % (ref 0.0–2.0)
BASOS ABS: 0 10*3/uL (ref 0.0–0.1)
EOS ABS: 0 10*3/uL (ref 0.0–0.5)
EOS%: 0.2 % (ref 0.0–7.0)
HCT: 27 % — ABNORMAL LOW (ref 34.8–46.6)
HEMOGLOBIN: 9 g/dL — AB (ref 11.6–15.9)
LYMPH%: 16.2 % (ref 14.0–49.7)
MCH: 34.9 pg — ABNORMAL HIGH (ref 25.1–34.0)
MCHC: 33.4 g/dL (ref 31.5–36.0)
MCV: 104.4 fL — AB (ref 79.5–101.0)
MONO#: 0.7 10*3/uL (ref 0.1–0.9)
MONO%: 11.9 % (ref 0.0–14.0)
NEUT%: 71.4 % (ref 38.4–76.8)
NEUTROS ABS: 4.3 10*3/uL (ref 1.5–6.5)
PLATELETS: 136 10*3/uL — AB (ref 145–400)
RBC: 2.59 10*6/uL — ABNORMAL LOW (ref 3.70–5.45)
RDW: 28.9 % — AB (ref 11.2–14.5)
WBC: 6.1 10*3/uL (ref 3.9–10.3)
lymph#: 1 10*3/uL (ref 0.9–3.3)

## 2014-11-13 ENCOUNTER — Other Ambulatory Visit: Payer: Self-pay | Admitting: Gynecologic Oncology

## 2014-11-14 ENCOUNTER — Telehealth: Payer: Self-pay

## 2014-11-14 NOTE — Telephone Encounter (Signed)
[place] received from [place] to update the patient that she no longer requires oral birth control pills as she has the IUD now . Contacted the patient , no answer , left a detailed message with call back number if additional questions arise.

## 2014-11-15 ENCOUNTER — Other Ambulatory Visit: Payer: BC Managed Care – PPO

## 2014-11-16 ENCOUNTER — Ambulatory Visit: Payer: BC Managed Care – PPO | Admitting: Gynecologic Oncology

## 2014-11-16 ENCOUNTER — Telehealth: Payer: Self-pay | Admitting: Gynecologic Oncology

## 2014-11-16 NOTE — Telephone Encounter (Signed)
Patient called stating she would like to come in to [facility] instead of [facility] for evaluation of uterine bleeding after IUD placement.  She reported moderate bleeding after IUD placement starting on Sunday with intermittent cramping.  Dr. Alycia Rossetti informed of situation.  Patient advised to come in tomorrow for an appt for assessment.  Verbalizing understanding.

## 2014-11-21 ENCOUNTER — Ambulatory Visit: Payer: BC Managed Care – PPO | Attending: Gynecologic Oncology | Admitting: Gynecologic Oncology

## 2014-11-21 ENCOUNTER — Other Ambulatory Visit (HOSPITAL_BASED_OUTPATIENT_CLINIC_OR_DEPARTMENT_OTHER): Payer: BC Managed Care – PPO

## 2014-11-21 VITALS — BP 134/86 | HR 87 | Temp 98.4°F | Resp 18

## 2014-11-21 DIAGNOSIS — C561 Malignant neoplasm of right ovary: Secondary | ICD-10-CM

## 2014-11-21 DIAGNOSIS — R062 Wheezing: Secondary | ICD-10-CM | POA: Diagnosis not present

## 2014-11-21 DIAGNOSIS — R059 Cough, unspecified: Secondary | ICD-10-CM

## 2014-11-21 DIAGNOSIS — R05 Cough: Secondary | ICD-10-CM | POA: Diagnosis not present

## 2014-11-21 DIAGNOSIS — J0100 Acute maxillary sinusitis, unspecified: Secondary | ICD-10-CM

## 2014-11-21 DIAGNOSIS — D696 Thrombocytopenia, unspecified: Secondary | ICD-10-CM

## 2014-11-21 LAB — COMPREHENSIVE METABOLIC PANEL (CC13)
ALT: 30 U/L (ref 0–55)
ANION GAP: 7 meq/L (ref 3–11)
AST: 20 U/L (ref 5–34)
Albumin: 3.8 g/dL (ref 3.5–5.0)
Alkaline Phosphatase: 51 U/L (ref 40–150)
BILIRUBIN TOTAL: 0.35 mg/dL (ref 0.20–1.20)
BUN: 16.4 mg/dL (ref 7.0–26.0)
CHLORIDE: 109 meq/L (ref 98–109)
CO2: 25 meq/L (ref 22–29)
CREATININE: 0.7 mg/dL (ref 0.6–1.1)
Calcium: 9.5 mg/dL (ref 8.4–10.4)
GLUCOSE: 118 mg/dL (ref 70–140)
Potassium: 3.8 mEq/L (ref 3.5–5.1)
Sodium: 141 mEq/L (ref 136–145)
TOTAL PROTEIN: 6.6 g/dL (ref 6.4–8.3)

## 2014-11-21 LAB — CBC WITH DIFFERENTIAL/PLATELET
BASO%: 0.6 % (ref 0.0–2.0)
BASOS ABS: 0 10*3/uL (ref 0.0–0.1)
EOS ABS: 0 10*3/uL (ref 0.0–0.5)
EOS%: 1.2 % (ref 0.0–7.0)
HEMATOCRIT: 24.1 % — AB (ref 34.8–46.6)
HEMOGLOBIN: 8 g/dL — AB (ref 11.6–15.9)
LYMPH#: 0.9 10*3/uL (ref 0.9–3.3)
LYMPH%: 34.5 % (ref 14.0–49.7)
MCH: 36.1 pg — ABNORMAL HIGH (ref 25.1–34.0)
MCHC: 33.2 g/dL (ref 31.5–36.0)
MCV: 108.7 fL — AB (ref 79.5–101.0)
MONO#: 0.1 10*3/uL (ref 0.1–0.9)
MONO%: 5.4 % (ref 0.0–14.0)
NEUT#: 1.5 10*3/uL (ref 1.5–6.5)
NEUT%: 58.3 % (ref 38.4–76.8)
Platelets: 50 10*3/uL — ABNORMAL LOW (ref 145–400)
RBC: 2.21 10*6/uL — ABNORMAL LOW (ref 3.70–5.45)
RDW: 22.9 % — AB (ref 11.2–14.5)
WBC: 2.5 10*3/uL — ABNORMAL LOW (ref 3.9–10.3)

## 2014-11-21 MED ORDER — AMOXICILLIN-POT CLAVULANATE 875-125 MG PO TABS: 1.0000 | ORAL_TABLET | Freq: Two times a day (BID) | ORAL | Status: DC

## 2014-11-21 MED ORDER — HYDROCODONE-HOMATROPINE 5-1.5 MG/5ML PO SYRP: 5.0000 mL | ORAL_SOLUTION | Freq: Four times a day (QID) | ORAL | Status: DC | PRN

## 2014-11-21 NOTE — Patient Instructions (Addendum)
Use the cough medication only at bedtime and do not take and drive.  You can use afrin nasal spray every twelve hours for three days or as directed on the box.  Please call if the symptoms persist.    Initiate bleeding precautions: If you hit your head go to the nearest emergency room.  If you injure yourself and the bleeding persists after 15 minutes, go to the nearest emergency room.  STOP TAKING ASPIRIN AT THIS TIME.  Thrombocytopenia Thrombocytopenia is a condition in which there is an abnormally small number of platelets in your blood. Platelets are also called thrombocytes. Platelets are needed for blood clotting. CAUSES Thrombocytopenia is caused by:   Decreased production of platelets. This can be caused by:  Aplastic anemia in which your bone marrow quits making blood cells.  Cancer in the bone marrow.  Use of certain medicines, including chemotherapy.  Infection in the bone marrow.  Heavy alcohol consumption.  Increased destruction of platelets. This can be caused by:  Certain immune diseases.  Use of certain drugs.  Certain blood clotting disorders.  Certain inherited disorders.  Certain bleeding disorders.  Pregnancy.  Having an enlarged spleen (hypersplenism). In hypersplenism, the spleen gathers up platelets from circulation. This means the platelets are not available to help with blood clotting. The spleen can enlarge due to cirrhosis or other conditions. SYMPTOMS  The symptoms of thrombocytopenia are side effects of poor blood clotting. Some of these are:  Abnormal bleeding.  Nosebleeds.  Heavy menstrual periods.  Blood in the urine or stools.  Purpura. This is a purplish discoloration in the skin produced by small bleeding vessels near the surface of the skin.  Bruising.  A rash that may be petechial. This looks like pinpoint, purplish-red spots on the skin and mucous membranes. It is caused by bleeding from small blood vessels  (capillaries). DIAGNOSIS  Your caregiver will make this diagnosis based on your exam and blood tests. Sometimes, a bone marrow study is done to look for the original cells (megakaryocytes) that make platelets. TREATMENT  Treatment depends on the cause of the condition.  Medicines may be given to help protect your platelets from being destroyed.  In some cases, a replacement (transfusion) of platelets may be required to stop or prevent bleeding.  Sometimes, the spleen must be surgically removed. HOME CARE INSTRUCTIONS   Check the skin and linings inside your mouth for bruising or bleeding as directed by your caregiver.  Check your sputum, urine, and stool for blood as directed by your caregiver.  Do not return to any activities that could cause bumps or bruises until your caregiver says it is okay.  Take extra care not to cut yourself when shaving or when using scissors, needles, knives, and other tools.  Take extra care not to burn yourself when ironing or cooking.  Ask your caregiver if it is okay for you to drink alcohol.  Only take over-the-counter or prescription medicines as directed by your caregiver.  Notify all your caregivers, including dentists and eye doctors, about your condition. SEEK IMMEDIATE MEDICAL CARE IF:   You develop active bleeding from anywhere in your body.  You develop unexplained bruising or bleeding.  You have blood in your sputum, urine, or stool. MAKE SURE YOU:  Understand these instructions.  Will watch your condition.  Will get help right away if you are not doing well or get worse.   This information is not intended to replace advice given to you by your health  care provider. Make sure you discuss any questions you have with your health care provider.   Document Released: 01/05/2005 Document Revised: 03/30/2011 Document Reviewed: 07/09/2014 Elsevier Interactive Patient Education [employer].

## 2014-11-22 ENCOUNTER — Other Ambulatory Visit: Payer: BC Managed Care – PPO

## 2014-11-22 NOTE — Progress Notes (Signed)
Follow Up Note: Gyn-Onc  Shelley Vega 34 y.o. female  CC:  Chief Complaint  Patient presents with  . Cough, Sinus drainage    Follow up, walk in, Hx ovarian ca    HPI:  Shelley Vega is a 34 y.o. woman who presented with odor with urination, vaginal discharge, and STD testing. She reported regular periods without intermenstrual bleeding but reported heavier cycles for the past couple of months. She was subsequently diagnosed with a large abdominal mass. Of note, patient's cardiologist is Dr. Nolberto Hanlon [phone]) in [place] associated with [place].  On 07/13/14 at [facility], she underwent surgery including:   Oncology Summary:   Malignant neoplasm of right ovary (RAF-HCC)    07/13/2014  Initial Diagnosis  Malignant neoplasm of right ovary (RAF-HCC)    07/13/2014  Surgery  Exploratory laparotomy with right salpingoophorectomy, right ureterolysis, infracolic omentectomy, right pelvic and aortic lymph node dissection, and staging biopsies     Findings: 1) 20cm right adnexal mass adherent to the mesentery and right pelvic sidewall - frozen section evaluation showing at least borderline tumor with extensive calcifications 2) Retroperitoneal fibrosis requiring right ureterolysis 3) Normal abdominal survey with normal appendix, large bowel, small bowel (run from terminal ileum to ligament of Treitz), smooth diaphragms bilaterally, smooth liver edge, normal appearing omentum and stomach 4) Normal appearing left tube and ovary 5) Normal uterus          Final Diagnosis:  A: Ovary, right, oophorectomy  - 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 (please see synoptic report and comment)  B: Pelvic adhesion, excision  - Involved by serous carcinoma, largest focus measures 0.7 cm  C: Omentum, omentectomy  - Involved by serous carcinoma, focus measures 0.3 cm  D: Peritoneum, pelvic  nodule, excision  - No serous carcinoma identified  - Fibroadipose tissue with chronic inflammation and focal calcification  E: Lymph nodes, pelvic, right, resection  - Nine lymph nodes negative for metastatic carcinoma (0/9)  F: Peritoneum, posterior adhesion, biopsy  - No serous carcinoma identified  - Fibrovascular tissue with hemorrhage  G: Lymph node, para-aortic, excision  - Four lymph nodes negative for metastatic carcinoma (0/4)  H: Peritoneum, gutter, right, biopsy  - No serous carcinoma identified  - Fibroadipose tissue with chronic inflammation  I: Peritoneum, gutter, left, biopsy  - No serous carcinoma identified  - Fibroadipose tissue  She has been receiving carboplatin and taxol under the care of Dr. Nancy Marus at [facility].  Cycle 1 began on 08/13/14, and her last cycle, #5, was given on 11/12/14.  Her CA 125 was 11.6 at her first cycle, 7.7 with the second, 8.3 with the third on 09/26/14, 6.7 on 10/22/14, and 7.7 on 11/12/14 with cycle 5.  Due to her persistent dysfunctional uterine bleeding throughout chemotherapy, she underwent a D&C with IUD placement with Dr. Alycia Rossetti at [facility] on 11/09/14.  Final path revealed:     Final Diagnosis  A: Endometrium, curettings  - Inactive endometrium with stromal decidualization, consistent with progestin effect  - Focal ciliated cell metaplasia  - No endometritis, hyperplasia, or malignancy identified  Interval History:  Shelley Vega presents today alone as a walk in for respiratory symptoms.  She was here getting labwork and wanted to be seen.  She states she developed a sore throat and cough two weeks ago.  The only thing that offered relief was drinking hot tea.  She had no nasal drainage  at that time.  She went to an urgent care and was given an inhaler, a z-pak, and tessalon pearls.  She reports no improvement in her symptoms with taking antibiotics and use of tessalon pearls.  She has developed sinus drainage, cough that sounds productive but  no sputum expelled.  She states she has been wheezing intermittently and reports inhaler use has benefit intermittently.  She is unable to speak in class because she is hoarse from moderate coughing and states she is not getting any rest due to a moderate cough at bedtime.  No fever reported but states having hot flashes so she is unsure she is has been having elevated temperatures.  She continues to take zyrtec daily.  She is concerned about having a respiratory infection since she is currently receiving chemotherapy at [facility].  In regards to the uterine bleeding, she reports spotting after IUD placement with about a golf ball size area on her maxi pad overnight.  She continues to take aspirin and effient.  Also asking what she needs to do about her elevated triglycerides.  See below for a more detailed review of systems.       Review of Systems  Constitutional: Feels tired due to lack of sleep from coughing.  Appetite adequate.  No early satiety, unintentional weight loss.    Cardiovascular: No chest pain.  Positive for mild bilateral edema.  Pulmonary: Positive for cough, intermittent wheezing, nasal drainage.  Gastrointestinal: No nausea, vomiting, or diarrhea. No bright red blood per rectum or change in bowel movement.  Intermittent constipation.  Genitourinary: Positive for vaginal spotting.  No frequency, urgency, or dysuria. No vaginal discharge.  Musculoskeletal: No myalgia or joint pain. Neurologic: No weakness, numbness, or change in gait.  Psychology: No depression, anxiety, or insomnia.  Current Meds:  Outpatient Encounter Prescriptions as of 11/21/2014  Medication Sig  . albuterol (PROVENTIL HFA;VENTOLIN HFA) 108 (90 BASE) MCG/ACT inhaler Inhale 2 puffs into the lungs every 6 (six) hours as needed for wheezing or shortness of breath.  . ALPRAZolam (XANAX) 1 MG tablet Take 1 mg by mouth.  Marland Kitchen amphetamine-dextroamphetamine (ADDERALL) 20 MG tablet Take 20 mg by mouth daily with lunch.  .  amphetamine-dextroamphetamine (ADDERALL) 30 MG tablet Take 30 mg by mouth every morning.  Marland Kitchen aspirin 81 MG chewable tablet CHEW  AND SWALLOW 1 TABLET PO QD  . atorvastatin (LIPITOR) 40 MG tablet Take 40 mg by mouth daily.  . cetirizine (ZYRTEC) 10 MG tablet Take 10 mg by mouth daily.  . ferrous sulfate 325 (65 FE) MG tablet Take 325 mg by mouth.  . losartan (COZAAR) 25 MG tablet Take 25 mg by mouth daily.  . magnesium oxide (MAGOX 400) 400 (241.3 MG) MG tablet Take 400 mg by mouth.  . metoprolol succinate (TOPROL-XL) 100 MG 24 hr tablet Take 100 mg by mouth daily. Take with or immediately following a meal.  . Multiple Vitamin (MULTI-VITAMINS) TABS Take by mouth.  . prasugrel (EFFIENT) 10 MG TABS tablet Take 10 mg by mouth daily.  Marland Kitchen amoxicillin-clavulanate (AUGMENTIN) 875-125 MG tablet Take 1 tablet by mouth 2 (two) times daily.  . clindamycin (CLEOCIN-T) 1 % external solution Apply to affected area 2 times daily  . fluconazole (DIFLUCAN) 100 MG tablet   . HYDROcodone-homatropine (HYCODAN) 5-1.5 MG/5ML syrup Take 5 mLs by mouth every 6 (six) hours as needed for cough.  . montelukast (SINGULAIR) 10 MG tablet Take 10 mg by mouth.  . nitroGLYCERIN (NITROSTAT) 0.4 MG SL tablet Place 0.4  mg under the tongue every 5 (five) minutes as needed for chest pain.  Marland Kitchen ondansetron (ZOFRAN) 8 MG tablet   . prochlorperazine (COMPAZINE) 10 MG tablet Take 10 mg by mouth.  . [DISCONTINUED] docusate sodium (COLACE) 100 MG capsule Take 100 mg by mouth as needed.   . [DISCONTINUED] fluconazole (DIFLUCAN) 150 MG tablet Take 1 tablet (150 mg total) by mouth daily. (Patient not taking: Reported on 10/04/2014)  . [DISCONTINUED] fluticasone (FLONASE) 50 MCG/ACT nasal spray Place 2 sprays into both nostrils daily as needed for allergies or rhinitis.  . [DISCONTINUED] gabapentin (NEURONTIN) 100 MG capsule Take 100 mg by mouth.  . [DISCONTINUED] guanFACINE (TENEX) 1 MG tablet TAKE 1 TABLET BY MOUTH EVERY NIGHT AT BEDTIME  .  [DISCONTINUED] HYDROmorphone (DILAUDID) 4 MG tablet Take 1 tablet (4 mg total) by mouth every 4 (four) hours as needed for severe pain. (Patient not taking: Reported on 10/04/2014)  . [DISCONTINUED] lidocaine-prilocaine (EMLA) cream Apply 1 application topically as needed. (Patient not taking: Reported on 10/04/2014)  . [DISCONTINUED] metroNIDAZOLE (METROGEL) 0.75 % vaginal gel   . [DISCONTINUED] norethindrone-ethinyl estradiol (CYCLAFEM,ALYACEN) 0.5/0.75/1-35 MG-MCG tablet Take 4 pills for 4 days, then 3 pills for 3 days, then 2 pills for 2 days and then one a day until you finish the second pack.  . [DISCONTINUED] oxyCODONE (OXY IR/ROXICODONE) 5 MG immediate release tablet Take 1-2 tablets (5-10 mg total) by mouth every 4 (four) hours as needed for severe pain.  . [DISCONTINUED] phenazopyridine (PYRIDIUM) 95 MG tablet Take 1 tablet (95 mg total) by mouth 3 (three) times daily as needed for pain.  . [DISCONTINUED] sulfamethoxazole-trimethoprim (BACTRIM DS,SEPTRA DS) 800-160 MG tablet Take 1 tablet by mouth 2 (two) times daily.   No facility-administered encounter medications on file as of 11/21/2014.    Allergy:  Allergies  Allergen Reactions  . Lisinopril Cough    Social Hx:   Social History   Social History  . Marital Status: Single    Spouse Name: N/A  . Number of Children: N/A  . Years of Education: N/A   Occupational History  . Not on file.   Social History Main Topics  . Smoking status: Former Smoker -- 1.00 packs/day for 8 years    Types: Cigarettes    Quit date: 08/19/2011  . Smokeless tobacco: Not on file  . Alcohol Use: 0.0 oz/week    0 Standard drinks or equivalent per week     Comment: soc - maybe 3 drinks per month  . Drug Use: Not on file  . Sexual Activity: Not on file   Other Topics Concern  . Not on file   Social History Narrative    Past Surgical Hx: No past surgical history on file.  Past Medical Hx:  Past Medical History  Diagnosis Date  . Heart  attack   . Ovarian cancer 2016    right ovary    Family Hx:  Family History  Problem Relation Age of Onset  . Colon polyps Father     about 2-3 removed following colonoscopy  . Irritable bowel syndrome Brother     1 previous colonoscopy - no polyps found  . Breast cancer Paternal Aunt 91    "low level" breast cancer  . Prostate cancer Maternal Grandfather 51  . Lymphoma Maternal Grandfather 71    large B-cell lymphoma  . Heart Problems Paternal Grandmother   . Heart attack Paternal Grandfather   . Cancer Other 76    unknown type  CBC    Component Value Date/Time   WBC 2.5* 11/21/2014 1535   RBC 2.21* 11/21/2014 1535   HGB 8.0* 11/21/2014 1535   HCT 24.1* 11/21/2014 1535   PLT 50* 11/21/2014 1535   MCV 108.7* 11/21/2014 1535   MCH 36.1* 11/21/2014 1535   MCHC 33.2 11/21/2014 1535   RDW 22.9* 11/21/2014 1535   LYMPHSABS 0.9 11/21/2014 1535   MONOABS 0.1 11/21/2014 1535   EOSABS 0.0 11/21/2014 1535   BASOSABS 0.0 11/21/2014 1535   CMP Latest Ref Rng 11/21/2014 11/08/2014 10/31/2014  Glucose 70 - 140 mg/dl 118 92 98  BUN 7.0 - 26.0 mg/dL 16.4 10.8 13.4  Creatinine 0.6 - 1.1 mg/dL 0.7 0.6 0.6  Sodium 136 - 145 mEq/L 141 137 135(L)  Potassium 3.5 - 5.1 mEq/L 3.8 3.9 4.0  CO2 22 - 29 mEq/L 25 21(L) 22  Calcium 8.4 - 10.4 mg/dL 9.5 8.4 9.1  Total Protein 6.4 - 8.3 g/dL 6.6 6.7 7.1  Total Bilirubin 0.20 - 1.20 mg/dL 0.35 <0.30 0.33  Alkaline Phos 40 - 150 U/L 51 46 47  AST 5 - 34 U/L 20 14 16   ALT 0 - 55 U/L 30 14 17      Vitals:  Blood pressure 134/86, pulse 87, temperature 98.4 F (36.9 C), temperature source Oral, resp. rate 18. 100% O2 at on RA  Physical Exam:  General: Well developed, well nourished female in no acute distress. Alert and oriented x 3.  Head/Neck: Supple without any enlargements.  Tympanic membranes without evidence of infection or fluid bilaterally.  Sinus turbinates erythematous and swollen bilaterally.  Oropharynx slightly erythematous with  no lesions identified.      Lymph node survey: Enlarged cervical nodes palpated.  No supraclavicular, axillary, or inguinal adenopathy.  Cardiovascular: Regular rate and rhythm. S1 and S2 normal.  Lungs: Clear in the bases, scattered wheezes noted in the upper lobes.  No crackles/rhonchi noted.  Skin: No rashes or lesions present. Back: No CVA tenderness.  Extremities: Mild non-pitting edema noted bilaterally.  No bilateral cyanosis or clubbing.   Assessment/Plan:  34 year old s/p exploratory laparotomy with right salpingoophorectomy, right ureterolysis, infracolic omentectomy, right pelvic and aortic lymph node dissection, and staging biopsies on 07/15/14 with Dr. 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  Uterus and left ovary/fallopian tube remain since patient wanted to preserve fertility.  She has completed cycle five of carboplatin/taxol on 11/12/14 with s/p D&C/IUD placement on 11/09/14.  She presented to the office today for respiratory symptoms.  Oxygen saturation 100% at rest.  Due to persist symptoms (cough, wheezing), starting a course of Augmentin twice daily for five days discussed but was determined to hold off at this time due to thrombocytopenia and potential risk for bleeding (adverse effect of augmentin including thrombocytopenia).  She would like something for cough stronger than Tessalon pearls.  Given prescription for Hycodan cough syrup to take only at bedtime and not drive while taking.  Advised to continue use of albuterol inhaler as needed and to obtain Afrin nasal spray over the counter to be used every 12 hours for the next three days as needed or as directed on the label.  Her lipid panel was discussed and handouts given about triglycerides and management.  Lab work discussed from today including PLT count.  She is to call the office with an update if her symptoms worsen or persist.  Reportable signs and symptoms reviewed.  Patient is advised to stop taking  aspirin at this time due she has a repeat CBC on Friday per Dr. Alycia Rossetti and Verdon Cummins, [place].  Bleeding precautions discussed including "If you hit your head go to the nearest emergency room.  If you injure yourself and the bleeding persists after 15 minutes, go to the nearest emergency room" and patient verbalizing understanding.  She is advised to call for any questions or concerns.  Dr. Alycia Rossetti and [facility] RN notified of situation at [facility].  All questions answered.  30 minutes spend counseling the patient on the above.      CROSS, MELISSA DEAL, NP 11/22/2014, 4:09 PM

## 2014-11-23 ENCOUNTER — Other Ambulatory Visit: Payer: Self-pay | Admitting: Gynecologic Oncology

## 2014-11-23 ENCOUNTER — Telehealth: Payer: Self-pay

## 2014-11-23 ENCOUNTER — Other Ambulatory Visit (HOSPITAL_BASED_OUTPATIENT_CLINIC_OR_DEPARTMENT_OTHER): Payer: BC Managed Care – PPO

## 2014-11-23 DIAGNOSIS — C561 Malignant neoplasm of right ovary: Secondary | ICD-10-CM

## 2014-11-23 DIAGNOSIS — C569 Malignant neoplasm of unspecified ovary: Secondary | ICD-10-CM

## 2014-11-23 DIAGNOSIS — D696 Thrombocytopenia, unspecified: Secondary | ICD-10-CM

## 2014-11-23 LAB — CBC WITH DIFFERENTIAL/PLATELET
BASO%: 0.5 % (ref 0.0–2.0)
Basophils Absolute: 0 10*3/uL (ref 0.0–0.1)
EOS%: 0.6 % (ref 0.0–7.0)
Eosinophils Absolute: 0 10*3/uL (ref 0.0–0.5)
HEMATOCRIT: 24.8 % — AB (ref 34.8–46.6)
HEMOGLOBIN: 8.2 g/dL — AB (ref 11.6–15.9)
LYMPH#: 0.6 10*3/uL — AB (ref 0.9–3.3)
LYMPH%: 31 % (ref 14.0–49.7)
MCH: 36.9 pg — ABNORMAL HIGH (ref 25.1–34.0)
MCHC: 33.2 g/dL (ref 31.5–36.0)
MCV: 111 fL — ABNORMAL HIGH (ref 79.5–101.0)
MONO#: 0.2 10*3/uL (ref 0.1–0.9)
MONO%: 11.2 % (ref 0.0–14.0)
NEUT%: 56.7 % (ref 38.4–76.8)
NEUTROS ABS: 1.2 10*3/uL — AB (ref 1.5–6.5)
PLATELETS: 68 10*3/uL — AB (ref 145–400)
RBC: 2.23 10*6/uL — ABNORMAL LOW (ref 3.70–5.45)
RDW: 23.3 % — AB (ref 11.2–14.5)
WBC: 2 10*3/uL — ABNORMAL LOW (ref 3.9–10.3)

## 2014-11-23 NOTE — Telephone Encounter (Signed)
Patient contacted to update with [address], 000 and WBC 2.0 , patient states she is feeling better , but still has a sore throat and that the coughing has greatly improved to point that she can sleep now. Patient denies fever or chills ,states she has "clear" nasal drainage with occasional productive cough that is "white/clear" in appearance. Patient informed per Joylene John ,APNP that an antibiotic is not indicated at this time and we will update her on Monday 11/26/2014 when she acan resume her aspirin . Patient states understanding and agreed with plan, patient aware to call with fever , bleeding or any additional changes or concerns over the weekend. Patient denies further questions at this time

## 2014-11-29 ENCOUNTER — Other Ambulatory Visit (HOSPITAL_BASED_OUTPATIENT_CLINIC_OR_DEPARTMENT_OTHER): Payer: BC Managed Care – PPO

## 2014-11-29 ENCOUNTER — Other Ambulatory Visit: Payer: BC Managed Care – PPO

## 2014-11-29 DIAGNOSIS — C561 Malignant neoplasm of right ovary: Secondary | ICD-10-CM | POA: Diagnosis not present

## 2014-11-29 LAB — CBC WITH DIFFERENTIAL/PLATELET
BASO%: 0 % (ref 0.0–2.0)
BASOS ABS: 0 10*3/uL (ref 0.0–0.1)
EOS ABS: 0 10*3/uL (ref 0.0–0.5)
EOS%: 0.6 % (ref 0.0–7.0)
HEMATOCRIT: 29.2 % — AB (ref 34.8–46.6)
HEMOGLOBIN: 9.7 g/dL — AB (ref 11.6–15.9)
LYMPH#: 0.9 10*3/uL (ref 0.9–3.3)
LYMPH%: 50.9 % — ABNORMAL HIGH (ref 14.0–49.7)
MCH: 36.7 pg — AB (ref 25.1–34.0)
MCHC: 33.2 g/dL (ref 31.5–36.0)
MCV: 110.6 fL — AB (ref 79.5–101.0)
MONO#: 0.4 10*3/uL (ref 0.1–0.9)
MONO%: 22.3 % — ABNORMAL HIGH (ref 0.0–14.0)
NEUT%: 26.2 % — ABNORMAL LOW (ref 38.4–76.8)
NEUTROS ABS: 0.5 10*3/uL — AB (ref 1.5–6.5)
Platelets: 108 10*3/uL — ABNORMAL LOW (ref 145–400)
RBC: 2.64 10*6/uL — ABNORMAL LOW (ref 3.70–5.45)
RDW: 19.7 % — AB (ref 11.2–14.5)
WBC: 1.8 10*3/uL — AB (ref 3.9–10.3)

## 2014-12-06 ENCOUNTER — Telehealth: Payer: Self-pay | Admitting: Gynecologic Oncology

## 2014-12-06 ENCOUNTER — Other Ambulatory Visit (HOSPITAL_BASED_OUTPATIENT_CLINIC_OR_DEPARTMENT_OTHER): Payer: BC Managed Care – PPO

## 2014-12-06 ENCOUNTER — Other Ambulatory Visit: Payer: BC Managed Care – PPO

## 2014-12-06 DIAGNOSIS — C561 Malignant neoplasm of right ovary: Secondary | ICD-10-CM | POA: Diagnosis not present

## 2014-12-06 LAB — CBC WITH DIFFERENTIAL/PLATELET
BASO%: 0.2 % (ref 0.0–2.0)
BASOS ABS: 0 10*3/uL (ref 0.0–0.1)
EOS ABS: 0 10*3/uL (ref 0.0–0.5)
EOS%: 0.2 % (ref 0.0–7.0)
HCT: 32.3 % — ABNORMAL LOW (ref 34.8–46.6)
HGB: 11 g/dL — ABNORMAL LOW (ref 11.6–15.9)
LYMPH%: 21.8 % (ref 14.0–49.7)
MCH: 37.7 pg — ABNORMAL HIGH (ref 25.1–34.0)
MCHC: 34.1 g/dL (ref 31.5–36.0)
MCV: 110.6 fL — AB (ref 79.5–101.0)
MONO#: 0.5 10*3/uL (ref 0.1–0.9)
MONO%: 10.7 % (ref 0.0–14.0)
NEUT#: 3.2 10*3/uL (ref 1.5–6.5)
NEUT%: 67.1 % (ref 38.4–76.8)
PLATELETS: 133 10*3/uL — AB (ref 145–400)
RBC: 2.92 10*6/uL — AB (ref 3.70–5.45)
RDW: 16.5 % — ABNORMAL HIGH (ref 11.2–14.5)
WBC: 4.8 10*3/uL (ref 3.9–10.3)
lymph#: 1 10*3/uL (ref 0.9–3.3)

## 2014-12-06 NOTE — Telephone Encounter (Signed)
Returned call to patient.  Patient asking about labwork and if this last cycle would be the last.  Explained that the follow up plan would be per Dr. Alycia Rossetti at [facility] but usually a scan is ordered to have a baseline and she would be seen every three months or sooner depending on physician preference.  Advised to call for any questions or concerns.  Labs discussed.

## 2014-12-11 ENCOUNTER — Encounter (HOSPITAL_COMMUNITY): Payer: Self-pay

## 2014-12-12 ENCOUNTER — Telehealth: Payer: Self-pay | Admitting: Gynecologic Oncology

## 2014-12-12 DIAGNOSIS — M545 Low back pain: Secondary | ICD-10-CM

## 2014-12-12 DIAGNOSIS — M2550 Pain in unspecified joint: Secondary | ICD-10-CM

## 2014-12-12 MED ORDER — OXYCODONE-ACETAMINOPHEN 5-325 MG PO TABS: 1.0000 | ORAL_TABLET | ORAL | Status: DC | PRN

## 2014-12-12 NOTE — Telephone Encounter (Signed)
Patient called stating she had her last cycle of chemotherapy at [facility] on Monday and is having moderate back and leg aches after.  She states it is the usual symptoms she has after chemo but more moderate and she is unable to sleep/rest.  Requesting a refill on pain medication.

## 2014-12-20 ENCOUNTER — Other Ambulatory Visit (HOSPITAL_BASED_OUTPATIENT_CLINIC_OR_DEPARTMENT_OTHER): Payer: BC Managed Care – PPO

## 2014-12-20 ENCOUNTER — Telehealth: Payer: Self-pay | Admitting: Gynecologic Oncology

## 2014-12-20 ENCOUNTER — Other Ambulatory Visit: Payer: BC Managed Care – PPO

## 2014-12-20 DIAGNOSIS — C561 Malignant neoplasm of right ovary: Secondary | ICD-10-CM | POA: Diagnosis not present

## 2014-12-20 LAB — CBC WITH DIFFERENTIAL/PLATELET
BASO%: 0.4 % (ref 0.0–2.0)
BASOS ABS: 0 10*3/uL (ref 0.0–0.1)
EOS ABS: 0 10*3/uL (ref 0.0–0.5)
EOS%: 1.1 % (ref 0.0–7.0)
HEMATOCRIT: 27.1 % — AB (ref 34.8–46.6)
HEMOGLOBIN: 9.1 g/dL — AB (ref 11.6–15.9)
LYMPH#: 0.8 10*3/uL — AB (ref 0.9–3.3)
LYMPH%: 44.7 % (ref 14.0–49.7)
MCH: 37.9 pg — AB (ref 25.1–34.0)
MCHC: 33.6 g/dL (ref 31.5–36.0)
MCV: 112.7 fL — AB (ref 79.5–101.0)
MONO#: 0.3 10*3/uL (ref 0.1–0.9)
MONO%: 14.2 % — AB (ref 0.0–14.0)
NEUT%: 39.6 % (ref 38.4–76.8)
NEUTROS ABS: 0.7 10*3/uL — AB (ref 1.5–6.5)
Platelets: 113 10*3/uL — ABNORMAL LOW (ref 145–400)
RBC: 2.4 10*6/uL — ABNORMAL LOW (ref 3.70–5.45)
RDW: 14.1 % (ref 11.2–14.5)
WBC: 1.8 10*3/uL — AB (ref 3.9–10.3)

## 2014-12-20 NOTE — Telephone Encounter (Signed)
Left message with patient about lab results and the need to implement neutropenic precautions.  Dr. Alycia Rossetti also emailed with the lab results.  Advised the patient to call the office for any questions or concerns.

## 2014-12-21 ENCOUNTER — Telehealth: Payer: Self-pay | Admitting: Gynecologic Oncology

## 2014-12-21 NOTE — Telephone Encounter (Signed)
Patient advised that she will be able to obtain her lab work while she is at [facility].  Order has been placed by Verdon Cummins, RN

## 2014-12-21 NOTE — Telephone Encounter (Signed)
Left message for patient to stay off from work tomorrow due to [facility] being 0.7 per Dr. Alycia Rossetti.  Advised to call for any questions or concerns.

## 2014-12-21 NOTE — Telephone Encounter (Signed)
12/21/14 14:00pm Spoke with patient about Dr. Elenora Gamma recommendations for a repeat CBC on Monday.  Patient stating she is at [facility] on Monday for a CT scan and would like to have her labs drawn while she is there.  Message sent to Dr. Alycia Rossetti and Verdon Cummins, RN at [facility] to see if lab appt could be arranged.  No other concerns voiced by patient.  Advised to call for any needs.

## 2014-12-27 ENCOUNTER — Other Ambulatory Visit (HOSPITAL_BASED_OUTPATIENT_CLINIC_OR_DEPARTMENT_OTHER): Payer: BC Managed Care – PPO

## 2014-12-27 ENCOUNTER — Other Ambulatory Visit: Payer: BC Managed Care – PPO

## 2014-12-27 DIAGNOSIS — C561 Malignant neoplasm of right ovary: Secondary | ICD-10-CM

## 2014-12-27 LAB — CBC WITH DIFFERENTIAL/PLATELET
BASO%: 0.5 % (ref 0.0–2.0)
Basophils Absolute: 0 10*3/uL (ref 0.0–0.1)
EOS%: 0.5 % (ref 0.0–7.0)
Eosinophils Absolute: 0 10*3/uL (ref 0.0–0.5)
HCT: 28.5 % — ABNORMAL LOW (ref 34.8–46.6)
HGB: 9.7 g/dL — ABNORMAL LOW (ref 11.6–15.9)
LYMPH%: 40.4 % (ref 14.0–49.7)
MCH: 37.9 pg — ABNORMAL HIGH (ref 25.1–34.0)
MCHC: 34 g/dL (ref 31.5–36.0)
MCV: 111.3 fL — ABNORMAL HIGH (ref 79.5–101.0)
MONO#: 0.5 10*3/uL (ref 0.1–0.9)
MONO%: 24.2 % — AB (ref 0.0–14.0)
NEUT#: 0.7 10*3/uL — ABNORMAL LOW (ref 1.5–6.5)
NEUT%: 34.4 % — AB (ref 38.4–76.8)
Platelets: 88 10*3/uL — ABNORMAL LOW (ref 145–400)
RBC: 2.56 10*6/uL — AB (ref 3.70–5.45)
RDW: 14.4 % (ref 11.2–14.5)
WBC: 2 10*3/uL — ABNORMAL LOW (ref 3.9–10.3)
lymph#: 0.8 10*3/uL — ABNORMAL LOW (ref 0.9–3.3)

## 2014-12-28 ENCOUNTER — Telehealth: Payer: Self-pay | Admitting: Gynecologic Oncology

## 2014-12-28 DIAGNOSIS — D696 Thrombocytopenia, unspecified: Secondary | ICD-10-CM

## 2014-12-28 DIAGNOSIS — C569 Malignant neoplasm of unspecified ovary: Secondary | ICD-10-CM

## 2014-12-28 NOTE — Telephone Encounter (Signed)
Shelley Vega had repeat labs on 12/27/14.  Values discussed with her in the lobby.  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  Information relayed to Dr. Alycia Rossetti at [facility] who is fine with the patient going to work on Monday and having repeat labs that afternoon.  Message left for the patient with this information.  Lab appt made 4pm on Monday.  Advised to call for any questions or concerns.

## 2014-12-31 ENCOUNTER — Other Ambulatory Visit (HOSPITAL_BASED_OUTPATIENT_CLINIC_OR_DEPARTMENT_OTHER): Payer: BC Managed Care – PPO

## 2014-12-31 DIAGNOSIS — C561 Malignant neoplasm of right ovary: Secondary | ICD-10-CM

## 2014-12-31 LAB — CBC WITH DIFFERENTIAL/PLATELET
BASO%: 0.2 % (ref 0.0–2.0)
BASOS ABS: 0 10*3/uL (ref 0.0–0.1)
EOS ABS: 0 10*3/uL (ref 0.0–0.5)
EOS%: 0.2 % (ref 0.0–7.0)
HEMATOCRIT: 29 % — AB (ref 34.8–46.6)
HEMOGLOBIN: 10 g/dL — AB (ref 11.6–15.9)
LYMPH#: 1 10*3/uL (ref 0.9–3.3)
LYMPH%: 20.1 % (ref 14.0–49.7)
MCH: 38.3 pg — AB (ref 25.1–34.0)
MCHC: 34.5 g/dL (ref 31.5–36.0)
MCV: 111.1 fL — AB (ref 79.5–101.0)
MONO#: 0.7 10*3/uL (ref 0.1–0.9)
MONO%: 12.9 % (ref 0.0–14.0)
NEUT%: 66.6 % (ref 38.4–76.8)
NEUTROS ABS: 3.4 10*3/uL (ref 1.5–6.5)
Platelets: 72 10*3/uL — ABNORMAL LOW (ref 145–400)
RBC: 2.61 10*6/uL — ABNORMAL LOW (ref 3.70–5.45)
RDW: 14.8 % — AB (ref 11.2–14.5)
WBC: 5.1 10*3/uL (ref 3.9–10.3)

## 2015-01-03 ENCOUNTER — Other Ambulatory Visit: Payer: BC Managed Care – PPO

## 2015-01-10 ENCOUNTER — Other Ambulatory Visit: Payer: BC Managed Care – PPO

## 2015-01-10 ENCOUNTER — Other Ambulatory Visit (HOSPITAL_BASED_OUTPATIENT_CLINIC_OR_DEPARTMENT_OTHER): Payer: BC Managed Care – PPO

## 2015-01-10 DIAGNOSIS — D696 Thrombocytopenia, unspecified: Secondary | ICD-10-CM

## 2015-01-10 DIAGNOSIS — C561 Malignant neoplasm of right ovary: Secondary | ICD-10-CM | POA: Diagnosis not present

## 2015-01-10 DIAGNOSIS — C569 Malignant neoplasm of unspecified ovary: Secondary | ICD-10-CM

## 2015-01-10 LAB — CBC WITH DIFFERENTIAL/PLATELET
BASO%: 0.3 % (ref 0.0–2.0)
BASOS ABS: 0 10*3/uL (ref 0.0–0.1)
EOS ABS: 0.1 10*3/uL (ref 0.0–0.5)
EOS%: 1 % (ref 0.0–7.0)
HCT: 34 % — ABNORMAL LOW (ref 34.8–46.6)
HGB: 11.3 g/dL — ABNORMAL LOW (ref 11.6–15.9)
LYMPH%: 13 % — AB (ref 14.0–49.7)
MCH: 37.1 pg — AB (ref 25.1–34.0)
MCHC: 33.2 g/dL (ref 31.5–36.0)
MCV: 111.8 fL — AB (ref 79.5–101.0)
MONO#: 0.6 10*3/uL (ref 0.1–0.9)
MONO%: 8.6 % (ref 0.0–14.0)
NEUT#: 5.1 10*3/uL (ref 1.5–6.5)
NEUT%: 77.1 % — AB (ref 38.4–76.8)
Platelets: 105 10*3/uL — ABNORMAL LOW (ref 145–400)
RBC: 3.04 10*6/uL — ABNORMAL LOW (ref 3.70–5.45)
RDW: 14.5 % (ref 11.2–14.5)
WBC: 6.6 10*3/uL (ref 3.9–10.3)
lymph#: 0.9 10*3/uL (ref 0.9–3.3)

## 2015-01-17 ENCOUNTER — Other Ambulatory Visit: Payer: BC Managed Care – PPO

## 2015-01-17 ENCOUNTER — Other Ambulatory Visit (HOSPITAL_BASED_OUTPATIENT_CLINIC_OR_DEPARTMENT_OTHER): Payer: BC Managed Care – PPO

## 2015-01-17 DIAGNOSIS — C561 Malignant neoplasm of right ovary: Secondary | ICD-10-CM | POA: Diagnosis not present

## 2015-01-17 LAB — CBC WITH DIFFERENTIAL/PLATELET
BASO%: 0.2 % (ref 0.0–2.0)
Basophils Absolute: 0 10*3/uL (ref 0.0–0.1)
EOS%: 2.7 % (ref 0.0–7.0)
Eosinophils Absolute: 0.2 10*3/uL (ref 0.0–0.5)
HEMATOCRIT: 32.7 % — AB (ref 34.8–46.6)
HEMOGLOBIN: 11.1 g/dL — AB (ref 11.6–15.9)
LYMPH#: 1.1 10*3/uL (ref 0.9–3.3)
LYMPH%: 18.6 % (ref 14.0–49.7)
MCH: 37.2 pg — ABNORMAL HIGH (ref 25.1–34.0)
MCHC: 33.9 g/dL (ref 31.5–36.0)
MCV: 109.7 fL — ABNORMAL HIGH (ref 79.5–101.0)
MONO#: 0.5 10*3/uL (ref 0.1–0.9)
MONO%: 8.5 % (ref 0.0–14.0)
NEUT#: 4.1 10*3/uL (ref 1.5–6.5)
NEUT%: 70 % (ref 38.4–76.8)
Platelets: 150 10*3/uL (ref 145–400)
RBC: 2.98 10*6/uL — ABNORMAL LOW (ref 3.70–5.45)
RDW: 13 % (ref 11.2–14.5)
WBC: 5.9 10*3/uL (ref 3.9–10.3)

## 2015-01-24 ENCOUNTER — Other Ambulatory Visit: Payer: BC Managed Care – PPO

## 2015-01-31 ENCOUNTER — Other Ambulatory Visit: Payer: BC Managed Care – PPO

## 2015-02-15 ENCOUNTER — Telehealth: Payer: Self-pay | Admitting: Gynecologic Oncology

## 2015-02-15 NOTE — Telephone Encounter (Signed)
Returned call to patient.  Left message for her to call the office.

## 2015-02-15 NOTE — Telephone Encounter (Signed)
Patient returned call to the office.  Reporting "weird, sharp pains like a knife cutting below the belly button that lasted a few minutes" beginning on Sunday.  She took ibuprofen at that time with relief.  She reports the same symptoms on Monday and yesterday.  Stating her period started this am.  Minimal amount of bright red blood at this time.  Mirena IUD had been placed at [facility] previously.  Patient concerned about the sharp abdominal pains and the uterine bleeding with having the IUD in.  Patient advised to monitor her symptoms and call this afternoon with an update.  Dr. Alycia Rossetti will be notified as well.

## 2015-03-01 ENCOUNTER — Encounter: Payer: Self-pay | Admitting: *Deleted

## 2015-03-01 NOTE — Progress Notes (Signed)
[place] Work  Clinical Social Work was referred by [employer] oncology for assessment of psychosocial needs.  Clinical Social Worker contacted patient by phone to offer support and assess for needs.  Patient shared she is having difficulty coping with cancer process after completing treatment.  CSW validated patients feelings and discuss very common emotional reaction of feeling anxious, depressed, or overwhelmed after completing cancer treatment.  CSW shard information on survivorship class 'Finding Your New Normal'- patient was very interested and signed up to attend upcoming April class.  CSW also made referral to counseling intern.  Patient also expressed interest in getting involved in [employer].  CSW encouraged patient to call with any additional questions or concerns and plans to follow up with patient after a counseling session.   Polo Riley, MSW, LCSW, OSW-C Clinical Social Worker [facility] [phone]

## 2015-03-11 ENCOUNTER — Telehealth: Payer: Self-pay | Admitting: General Practice

## 2015-03-21 ENCOUNTER — Telehealth: Payer: Self-pay | Admitting: General Practice

## 2015-03-21 NOTE — Telephone Encounter (Signed)
To Close This Visit     Required Items   No additional encounter notes found.       Corinn, Lariccia D - 03/11/15 >','<< Less Detail',event)" href="javascript:;">More Detail >>   Jarrett Soho [id number])   Sent: Mon March 11, 2015 10:47 AM    To: [name] Size     Small Medium Large Extra Extra Large    Shelley Vega  03/11/2015  Telephone  MRN:  [id number]   Description: 34 year old female  Provider: Jarrett Soho  Department: [facility]Spirit[facility]          Call Documentation     No notes of this type exist for this encounter.     Encounter MyChart Messages     No messages in this encounter     Created by     Jarrett Soho on 03/11/2015 10:47 AM     Visit Pharmacy     [id number] - [place], [place]     Contacts       Type Contact Phone    03/11/2015 10:48 AM Phone ([address]) [name], [name] (Self) [phone] (H)    Left Message-  Left voicemail to patient about re-scheduling client's counseling session that she missed last week. Gave client times and dates that would work.

## 2015-03-21 NOTE — Telephone Encounter (Signed)
Counselor f/u with patient about scheduling for first counseling appointment. Patient's voicemail box was not set up, so counselor was unable to leave a voicemail.

## 2015-08-16 ENCOUNTER — Ambulatory Visit: Payer: BC Managed Care – PPO | Admitting: Oncology

## 2015-08-16 ENCOUNTER — Other Ambulatory Visit: Payer: BC Managed Care – PPO

## 2015-08-21 ENCOUNTER — Other Ambulatory Visit: Payer: Self-pay | Admitting: Gynecologic Oncology

## 2015-08-21 DIAGNOSIS — C569 Malignant neoplasm of unspecified ovary: Secondary | ICD-10-CM

## 2015-08-25 ENCOUNTER — Encounter (HOSPITAL_COMMUNITY): Payer: Self-pay | Admitting: Emergency Medicine

## 2015-08-25 ENCOUNTER — Emergency Department (HOSPITAL_COMMUNITY)
Admission: EM | Admit: 2015-08-25 | Discharge: 2015-08-26 | Disposition: A | Discharge status: Home / Self Care | Payer: BC Managed Care – PPO | Attending: Emergency Medicine | Admitting: Emergency Medicine

## 2015-08-25 DIAGNOSIS — K2970 Gastritis, unspecified, without bleeding: Secondary | ICD-10-CM | POA: Insufficient documentation

## 2015-08-25 DIAGNOSIS — Z8543 Personal history of malignant neoplasm of ovary: Secondary | ICD-10-CM | POA: Insufficient documentation

## 2015-08-25 DIAGNOSIS — T451X5A Adverse effect of antineoplastic and immunosuppressive drugs, initial encounter: Secondary | ICD-10-CM | POA: Diagnosis not present

## 2015-08-25 DIAGNOSIS — Z7982 Long term (current) use of aspirin: Secondary | ICD-10-CM | POA: Insufficient documentation

## 2015-08-25 DIAGNOSIS — R197 Diarrhea, unspecified: Secondary | ICD-10-CM | POA: Insufficient documentation

## 2015-08-25 DIAGNOSIS — E876 Hypokalemia: Secondary | ICD-10-CM | POA: Diagnosis not present

## 2015-08-25 DIAGNOSIS — Z87891 Personal history of nicotine dependence: Secondary | ICD-10-CM | POA: Insufficient documentation

## 2015-08-25 DIAGNOSIS — Z79899 Other long term (current) drug therapy: Secondary | ICD-10-CM | POA: Diagnosis not present

## 2015-08-25 LAB — COMPREHENSIVE METABOLIC PANEL
ALBUMIN: 3.7 g/dL (ref 3.5–5.0)
ALK PHOS: 60 U/L (ref 38–126)
ALT: 23 U/L (ref 14–54)
ANION GAP: 6 (ref 5–15)
AST: 17 U/L (ref 15–41)
BUN: 24 mg/dL — ABNORMAL HIGH (ref 6–20)
CALCIUM: 7.9 mg/dL — AB (ref 8.9–10.3)
CO2: 25 mmol/L (ref 22–32)
CREATININE: 0.5 mg/dL (ref 0.44–1.00)
Chloride: 106 mmol/L (ref 101–111)
GFR calc Af Amer: >60 mL/min (ref >60)
GFR calc non Af Amer: >60 mL/min (ref >60)
Glucose, Bld: 103 mg/dL — ABNORMAL HIGH (ref 65–99)
Potassium: 2.9 mmol/L — ABNORMAL LOW (ref 3.5–5.1)
SODIUM: 137 mmol/L (ref 135–145)
Total Bilirubin: 0.5 mg/dL (ref 0.3–1.2)
Total Protein: 6 g/dL — ABNORMAL LOW (ref 6.5–8.1)

## 2015-08-25 LAB — LIPASE, BLOOD: LIPASE: 41 U/L (ref 11–51)

## 2015-08-25 LAB — DIFFERENTIAL
BASOS ABS: 0 10*3/uL (ref 0.0–0.1)
Basophils Relative: 0 %
EOS PCT: 1 %
Eosinophils Absolute: 0 10*3/uL (ref 0.0–0.7)
LYMPHS PCT: 23 %
Lymphs Abs: 1.4 10*3/uL (ref 0.7–4.0)
Monocytes Absolute: 0 10*3/uL — ABNORMAL LOW (ref 0.1–1.0)
Monocytes Relative: 1 %
NEUTROS ABS: 4.7 10*3/uL (ref 1.7–7.7)
NEUTROS PCT: 75 %

## 2015-08-25 LAB — CBC
HCT: 36.3 % (ref 36.0–46.0)
HEMOGLOBIN: 12.6 g/dL (ref 12.0–15.0)
MCH: 33.1 pg (ref 26.0–34.0)
MCHC: 34.7 g/dL (ref 30.0–36.0)
MCV: 95.3 fL (ref 78.0–100.0)
PLATELETS: 133 10*3/uL — AB (ref 150–400)
RBC: 3.81 MIL/uL — AB (ref 3.87–5.11)
RDW: 12.7 % (ref 11.5–15.5)
WBC: 6.3 10*3/uL (ref 4.0–10.5)

## 2015-08-25 MED ORDER — BISMUTH SUBSALICYLATE 262 MG/15ML PO SUSP
30.0000 mL | Freq: Once | ORAL | Status: AC
Start: 2015-08-25 — End: 2015-08-25
  Administered 2015-08-25: 30 mL via ORAL
  Filled 2015-08-25: qty 118

## 2015-08-25 MED ORDER — PROMETHAZINE HCL 25 MG PO TABS
25.0000 mg | ORAL_TABLET | Freq: Once | ORAL | Status: AC
  Administered 2015-08-25: 25 mg via ORAL
  Filled 2015-08-25: qty 1

## 2015-08-25 MED ORDER — POTASSIUM CHLORIDE CRYS ER 20 MEQ PO TBCR
40.0000 meq | EXTENDED_RELEASE_TABLET | Freq: Once | ORAL | Status: AC
  Administered 2015-08-26: 40 meq via ORAL
  Filled 2015-08-25: qty 2

## 2015-08-25 MED ORDER — SODIUM CHLORIDE 0.9 % IV BOLUS (SEPSIS)
1000.0000 mL | Freq: Once | INTRAVENOUS | Status: AC
  Administered 2015-08-25: 1000 mL via INTRAVENOUS

## 2015-08-25 NOTE — ED Triage Notes (Signed)
Pt states that she just started chemo again on Monday after not having it since last year. States since yesterday around noon she has had constant diarrhea. Told to come in for fluids and a check of her labs. Alert and oriented. Hx of ovarian CA.

## 2015-08-25 NOTE — ED Notes (Signed)
Pt made aware of need for urine sample, 1 attempt made but pt was unsuccessful

## 2015-08-26 MED ORDER — PROMETHAZINE HCL 25 MG PO TABS: 25.0000 mg | ORAL_TABLET | Freq: Four times a day (QID) | ORAL | 0 refills | Status: DC | PRN

## 2015-08-26 MED ORDER — ZINC OXIDE 40 % EX PSTE: 1.0000 "application " | PASTE | Freq: Two times a day (BID) | CUTANEOUS | 0 refills | Status: DC | PRN

## 2015-08-26 MED ORDER — POTASSIUM CHLORIDE CRYS ER 20 MEQ PO TBCR: 40.0000 meq | EXTENDED_RELEASE_TABLET | Freq: Two times a day (BID) | ORAL | 0 refills | Status: DC

## 2015-08-26 MED ORDER — HEPARIN SOD (PORK) LOCK FLUSH 100 UNIT/ML IV SOLN
500.0000 [IU] | Freq: Once | INTRAVENOUS | Status: AC
  Administered 2015-08-26: 500 [IU]
  Filled 2015-08-26: qty 5

## 2015-08-26 NOTE — ED Provider Notes (Signed)
[facility] DEPT Provider Note   CSN: [id number] Arrival date & time: 08/25/15  2120  First Provider Contact:  First MD Initiated Contact with Patient 08/25/15 2153        History   Chief Complaint Chief Complaint  Patient presents with  . Diarrhea  . Cancer    HPI Shelley Vega is a 35 y.o. female.  35 y.o. female presents with intractable diarrhea and nausea. She had chemotherapy recently prir to onset. Has been watery in nature and has had over 30 episodes. Has used immodium without relief. No worsening noted with interventions. Her skin around her perineum has become raw and irritated. She has been unable to sleep. Has been occurring over last several days. She has felt weak.    The history is provided by the patient.    Past Medical History:  Diagnosis Date  . Heart attack ([place])   . Ovarian cancer [facility]) 2016   right ovary    Patient Active Problem List   Diagnosis Date Noted  . Genetic testing 08/21/2014  . Family history of breast cancer in female 08/21/2014  . Family history of cancer 08/21/2014  . Arteriosclerosis of coronary artery 07/25/2014  . Dyslipidemia 07/25/2014  . [place] of [place] ([place] 07/23/2014  . Post-operative state 07/15/2014  . Apnea, sleep 06/13/2013  . Acute non-ST segment elevation myocardial infarction ([place]) 05/09/2013  . ADD (attention deficit disorder) 04/25/2013  . Airway hyperreactivity 04/25/2013  . BP (high blood pressure) 04/25/2013  . Class 1 obesity 04/25/2013    History reviewed. No pertinent surgical history.  OB History    No data available       Home Medications    Prior to Admission medications   Medication Sig Start Date End Date Taking? Authorizing Provider  albuterol (PROVENTIL HFA;VENTOLIN HFA) 108 (90 BASE) MCG/ACT inhaler Inhale 2 puffs into the lungs every 6 (six) hours as needed for wheezing or shortness of breath.   Yes Historical Provider, MD  amphetamine-dextroamphetamine (ADDERALL XR) 30 MG 24 hr  capsule Take 30 mg by mouth daily with breakfast.   Yes Historical Provider, MD  amphetamine-dextroamphetamine (ADDERALL) 20 MG tablet Take 20 mg by mouth daily with lunch.   Yes Historical Provider, MD  aspirin 81 MG chewable tablet Chew 81 mg by mouth daily.   Yes Historical Provider, MD  atorvastatin (LIPITOR) 40 MG tablet Take 40 mg by mouth at bedtime.    Yes Historical Provider, MD  cetirizine (ZYRTEC) 10 MG tablet Take 10 mg by mouth daily.   Yes Historical Provider, MD  clonazePAM (KLONOPIN) 1 MG tablet Take 1 mg by mouth 2 (two) times daily as needed for anxiety.   Yes Historical Provider, MD  dexamethasone (DECADRON) 4 MG tablet Take 8 mg by mouth daily with breakfast. Pt only takes this medication on days 2,3, and 4 of chemo.   Yes Historical Provider, MD  fluticasone (FLONASE) 50 MCG/ACT nasal spray Place 2 sprays into both nostrils daily as needed for rhinitis.   Yes Historical Provider, MD  loperamide (IMODIUM) 2 MG capsule Take 4 mg by mouth as needed for diarrhea or loose stools.   Yes Historical Provider, MD  losartan (COZAAR) 50 MG tablet Take 100 mg by mouth daily.   Yes Historical Provider, MD  magnesium oxide (MAGNESIUM-OXIDE) 400 (241.3 Mg) MG tablet Take 400 mg by mouth 2 (two) times daily.   Yes Historical Provider, MD  metoprolol succinate (TOPROL-XL) 100 MG 24 hr tablet Take 100 mg by mouth  daily. Take with or immediately following a meal.   Yes Historical Provider, MD  Multiple Vitamin (MULTIVITAMIN WITH MINERALS) TABS tablet Take 1 tablet by mouth daily.   Yes Historical Provider, MD  nitroGLYCERIN (NITROSTAT) 0.4 MG SL tablet Place 0.4 mg under the tongue every 5 (five) minutes as needed for chest pain.   Yes Historical Provider, MD  oxyCODONE (OXY IR/ROXICODONE) 5 MG immediate release tablet Take 5 mg by mouth every 6 (six) hours as needed for severe pain.   Yes Historical Provider, MD  prasugrel (EFFIENT) 10 MG TABS tablet Take 10 mg by mouth daily.   Yes Historical  Provider, MD  venlafaxine (EFFEXOR) 37.5 MG tablet Take 37.5 mg by mouth daily.   Yes Historical Provider, MD    Family History Family History  Problem Relation Age of Onset  . Colon polyps Father     about 2-3 removed following colonoscopy  . Irritable bowel syndrome Brother     1 previous colonoscopy - no polyps found  . Breast cancer Paternal Aunt 46    "low level" breast cancer  . Prostate cancer Maternal Grandfather 78  . Lymphoma Maternal Grandfather 71    large B-cell lymphoma  . Heart Problems Paternal Grandmother   . Heart attack Paternal Grandfather   . Cancer Other 79    unknown type    Social History Social History  Substance Use Topics  . Smoking status: Former Smoker    Packs/day: 1.00    Years: 8.00    Types: Cigarettes    Quit date: 08/19/2011  . Smokeless tobacco: Not on file  . Alcohol use 0.0 oz/week     Comment: soc - maybe 3 drinks per month     Allergies   Lisinopril   Review of Systems Review of Systems  All other systems reviewed and are negative.    Physical Exam Updated Vital Signs BP 125/95 (BP Location: Left Arm)   Pulse 84   Temp 97.6 F (36.4 C) (Oral)   Resp 17   Ht 5\' 3"  (1.6 m)   Wt 190 lb (86.2 kg)   LMP 08/27/2014   SpO2 100%   BMI 33.66 kg/m   Physical Exam  Constitutional: She is oriented to person, place, and time. She appears well-developed and well-nourished. No distress.  HENT:  Head: Normocephalic.  Eyes: Conjunctivae are normal.  Neck: Neck supple. No tracheal deviation present.  Cardiovascular: Normal rate, regular rhythm and normal heart sounds.   Pulmonary/Chest: Effort normal and breath sounds normal. No respiratory distress.  Abdominal: Soft. She exhibits no distension. There is no tenderness. There is no guarding.  Neurological: She is alert and oriented to person, place, and time.  Skin: Skin is warm and dry.  Psychiatric: She has a normal mood and affect.  Vitals reviewed.    ED Treatments /  Results  Labs (all labs ordered are listed, but only abnormal results are displayed) Labs Reviewed  COMPREHENSIVE METABOLIC PANEL - Abnormal; Notable for the following:       Result Value   Potassium 2.9 (*)    Glucose, Bld 103 (*)    BUN 24 (*)    Calcium 7.9 (*)    Total Protein 6.0 (*)    All other components within normal limits  CBC - Abnormal; Notable for the following:    RBC 3.81 (*)    Platelets 133 (*)    All other components within normal limits  DIFFERENTIAL - Abnormal; Notable for the following:  Monocytes Absolute 0.0 (*)    All other components within normal limits  LIPASE, BLOOD    EKG  EKG Interpretation None       Radiology No results found.  Procedures Procedures (including critical care time)  Medications Ordered in ED Medications  sodium chloride 0.9 % bolus 1,000 mL (0 mLs Intravenous Stopped 08/26/15 0031)  promethazine (PHENERGAN) tablet 25 mg (25 mg Oral Given 08/25/15 [id number])  bismuth subsalicylate (PEPTO BISMOL) 262 MG/15ML suspension 30 mL (30 mLs Oral Given 08/25/15 2311)  potassium chloride SA (K-DUR,KLOR-CON) CR tablet 40 mEq (40 mEq Oral Given 08/26/15 0106)  heparin lock flush 100 unit/mL (500 Units Intracatheter Given 08/26/15 0107)     Initial Impression / Assessment and Plan / ED Course  I have reviewed the triage vital signs and the nursing notes.  Pertinent labs & imaging results that were available during my care of the patient were reviewed by me and considered in my medical decision making (see chart for details).  Clinical Course    35 y.o. female presents with diarrhea following chemotherapy treatment for ovarian cancer. Supportive care measures provided and Pt improved dramatically, able to sleep. Screening labs demonstrate immunocompetence and mild hypokalemia likely 2/2 losses. Recommended bismuth salts to slow progression of diarrhea and provided potassium supplementation for home with barrier cream for skin protection.    Final Clinical Impressions(s) / ED Diagnoses   Final diagnoses:  Diarrhea, unspecified type  Hypokalemia  Chemotherapy adverse reaction, initial encounter    New Prescriptions Discharge Medication List as of 08/26/2015 12:38 AM    START taking these medications   Details  potassium chloride SA (K-DUR,KLOR-CON) 20 MEQ tablet Take 2 tablets (40 mEq total) by mouth 2 (two) times daily., Starting Mon 08/26/2015, Print    promethazine (PHENERGAN) 25 MG tablet Take 1 tablet (25 mg total) by mouth every 6 (six) hours as needed for nausea or vomiting., Starting Mon 08/26/2015, Print    Zinc Oxide (DESITIN) 40 % PSTE Apply 1 application topically 2 (two) times daily as needed (skin breakdown)., Starting Mon 08/26/2015, Print         Leo Grosser, MD 08/26/15 [phone]

## 2015-09-05 ENCOUNTER — Other Ambulatory Visit (HOSPITAL_BASED_OUTPATIENT_CLINIC_OR_DEPARTMENT_OTHER): Payer: BC Managed Care – PPO

## 2015-09-05 DIAGNOSIS — C569 Malignant neoplasm of unspecified ovary: Secondary | ICD-10-CM

## 2015-09-05 DIAGNOSIS — C561 Malignant neoplasm of right ovary: Secondary | ICD-10-CM

## 2015-09-05 LAB — COMPREHENSIVE METABOLIC PANEL
ALBUMIN: 3.7 g/dL (ref 3.5–5.0)
ALT: 31 U/L (ref 0–55)
ANION GAP: 10 meq/L (ref 3–11)
AST: 19 U/L (ref 5–34)
Alkaline Phosphatase: 85 U/L (ref 40–150)
BUN: 17 mg/dL (ref 7.0–26.0)
CALCIUM: 9 mg/dL (ref 8.4–10.4)
CHLORIDE: 106 meq/L (ref 98–109)
CO2: 24 meq/L (ref 22–29)
CREATININE: 0.6 mg/dL (ref 0.6–1.1)
EGFR: >90 mL/min/{1.73_m2} (ref >90)
Glucose: 85 mg/dl (ref 70–140)
POTASSIUM: 3.7 meq/L (ref 3.5–5.1)
Sodium: 140 mEq/L (ref 136–145)
Total Bilirubin: 0.41 mg/dL (ref 0.20–1.20)
Total Protein: 7.1 g/dL (ref 6.4–8.3)

## 2015-09-05 LAB — CBC WITH DIFFERENTIAL/PLATELET
BASO%: 0.6 % (ref 0.0–2.0)
BASOS ABS: 0 10*3/uL (ref 0.0–0.1)
EOS%: 2.8 % (ref 0.0–7.0)
Eosinophils Absolute: 0.1 10*3/uL (ref 0.0–0.5)
HEMATOCRIT: 34.1 % — AB (ref 34.8–46.6)
HEMOGLOBIN: 11.9 g/dL (ref 11.6–15.9)
LYMPH#: 1.9 10*3/uL (ref 0.9–3.3)
LYMPH%: 53.7 % — ABNORMAL HIGH (ref 14.0–49.7)
MCH: 32.4 pg (ref 25.1–34.0)
MCHC: 34.9 g/dL (ref 31.5–36.0)
MCV: 92.9 fL (ref 79.5–101.0)
MONO#: 0.4 10*3/uL (ref 0.1–0.9)
MONO%: 9.7 % (ref 0.0–14.0)
NEUT#: 1.2 10*3/uL — ABNORMAL LOW (ref 1.5–6.5)
NEUT%: 33.2 % — AB (ref 38.4–76.8)
PLATELETS: 173 10*3/uL (ref 145–400)
RBC: 3.67 10*6/uL — ABNORMAL LOW (ref 3.70–5.45)
RDW: 12.9 % (ref 11.2–14.5)
WBC: 3.6 10*3/uL — ABNORMAL LOW (ref 3.9–10.3)
nRBC: 1 % — ABNORMAL HIGH (ref 0–0)

## 2015-09-12 ENCOUNTER — Other Ambulatory Visit: Payer: BC Managed Care – PPO

## 2015-09-19 ENCOUNTER — Other Ambulatory Visit (HOSPITAL_BASED_OUTPATIENT_CLINIC_OR_DEPARTMENT_OTHER): Payer: BC Managed Care – PPO

## 2015-09-19 DIAGNOSIS — C561 Malignant neoplasm of right ovary: Secondary | ICD-10-CM

## 2015-09-19 DIAGNOSIS — C569 Malignant neoplasm of unspecified ovary: Secondary | ICD-10-CM

## 2015-09-19 LAB — CBC WITH DIFFERENTIAL/PLATELET
BASO%: 0.3 % (ref 0.0–2.0)
BASOS ABS: 0 10*3/uL (ref 0.0–0.1)
EOS ABS: 0 10*3/uL (ref 0.0–0.5)
EOS%: 1 % (ref 0.0–7.0)
HEMATOCRIT: 31.8 % — AB (ref 34.8–46.6)
HGB: 10.8 g/dL — ABNORMAL LOW (ref 11.6–15.9)
LYMPH#: 1.1 10*3/uL (ref 0.9–3.3)
LYMPH%: 40.6 % (ref 14.0–49.7)
MCH: 32.2 pg (ref 25.1–34.0)
MCHC: 34 g/dL (ref 31.5–36.0)
MCV: 94.6 fL (ref 79.5–101.0)
MONO#: 0.5 10*3/uL (ref 0.1–0.9)
MONO%: 19.8 % — ABNORMAL HIGH (ref 0.0–14.0)
NEUT#: 1 10*3/uL — ABNORMAL LOW (ref 1.5–6.5)
NEUT%: 38.3 % — AB (ref 38.4–76.8)
PLATELETS: 156 10*3/uL (ref 145–400)
RBC: 3.36 10*6/uL — ABNORMAL LOW (ref 3.70–5.45)
RDW: 13.4 % (ref 11.2–14.5)
WBC: 2.7 10*3/uL — ABNORMAL LOW (ref 3.9–10.3)

## 2015-09-26 ENCOUNTER — Other Ambulatory Visit (HOSPITAL_BASED_OUTPATIENT_CLINIC_OR_DEPARTMENT_OTHER): Payer: BC Managed Care – PPO

## 2015-09-26 DIAGNOSIS — C569 Malignant neoplasm of unspecified ovary: Secondary | ICD-10-CM

## 2015-09-26 DIAGNOSIS — C561 Malignant neoplasm of right ovary: Secondary | ICD-10-CM

## 2015-09-26 LAB — CBC WITH DIFFERENTIAL/PLATELET
BASO%: 0.6 % (ref 0.0–2.0)
Basophils Absolute: 0 10*3/uL (ref 0.0–0.1)
EOS%: 0.4 % (ref 0.0–7.0)
Eosinophils Absolute: 0 10*3/uL (ref 0.0–0.5)
HCT: 30.5 % — ABNORMAL LOW (ref 34.8–46.6)
HEMOGLOBIN: 10.6 g/dL — AB (ref 11.6–15.9)
LYMPH%: 21.3 % (ref 14.0–49.7)
MCH: 33.1 pg (ref 25.1–34.0)
MCHC: 34.9 g/dL (ref 31.5–36.0)
MCV: 94.9 fL (ref 79.5–101.0)
MONO#: 0.4 10*3/uL (ref 0.1–0.9)
MONO%: 8.1 % (ref 0.0–14.0)
NEUT%: 69.6 % (ref 38.4–76.8)
NEUTROS ABS: 3.4 10*3/uL (ref 1.5–6.5)
Platelets: 141 10*3/uL — ABNORMAL LOW (ref 145–400)
RBC: 3.21 10*6/uL — ABNORMAL LOW (ref 3.70–5.45)
RDW: 14 % (ref 11.2–14.5)
WBC: 4.8 10*3/uL (ref 3.9–10.3)
lymph#: 1 10*3/uL (ref 0.9–3.3)

## 2015-09-26 LAB — COMPREHENSIVE METABOLIC PANEL
ALBUMIN: 3.3 g/dL — AB (ref 3.5–5.0)
ALK PHOS: 85 U/L (ref 40–150)
ALT: 24 U/L (ref 0–55)
AST: 14 U/L (ref 5–34)
Anion Gap: 10 mEq/L (ref 3–11)
BILIRUBIN TOTAL: 0.33 mg/dL (ref 0.20–1.20)
BUN: 12.8 mg/dL (ref 7.0–26.0)
CO2: 25 mEq/L (ref 22–29)
Calcium: 9 mg/dL (ref 8.4–10.4)
Chloride: 106 mEq/L (ref 98–109)
Creatinine: 0.7 mg/dL (ref 0.6–1.1)
EGFR: >90 mL/min/{1.73_m2} (ref >90)
Glucose: 141 mg/dl — ABNORMAL HIGH (ref 70–140)
Potassium: 3.6 mEq/L (ref 3.5–5.1)
SODIUM: 141 meq/L (ref 136–145)
TOTAL PROTEIN: 6.9 g/dL (ref 6.4–8.3)

## 2015-10-03 ENCOUNTER — Other Ambulatory Visit: Payer: BC Managed Care – PPO

## 2015-10-10 ENCOUNTER — Other Ambulatory Visit (HOSPITAL_BASED_OUTPATIENT_CLINIC_OR_DEPARTMENT_OTHER): Payer: BC Managed Care – PPO

## 2015-10-10 ENCOUNTER — Encounter: Payer: Self-pay | Admitting: Gynecologic Oncology

## 2015-10-10 DIAGNOSIS — C569 Malignant neoplasm of unspecified ovary: Secondary | ICD-10-CM

## 2015-10-10 DIAGNOSIS — C561 Malignant neoplasm of right ovary: Secondary | ICD-10-CM

## 2015-10-10 LAB — CBC WITH DIFFERENTIAL/PLATELET
BASO%: 0.4 % (ref 0.0–2.0)
Basophils Absolute: 0 10*3/uL (ref 0.0–0.1)
EOS ABS: 0 10*3/uL (ref 0.0–0.5)
EOS%: 0.4 % (ref 0.0–7.0)
HCT: 28.2 % — ABNORMAL LOW (ref 34.8–46.6)
HEMOGLOBIN: 9.7 g/dL — AB (ref 11.6–15.9)
LYMPH%: 53.1 % — ABNORMAL HIGH (ref 14.0–49.7)
MCH: 32.9 pg (ref 25.1–34.0)
MCHC: 34.4 g/dL (ref 31.5–36.0)
MCV: 95.6 fL (ref 79.5–101.0)
MONO#: 0.3 10*3/uL (ref 0.1–0.9)
MONO%: 10.5 % (ref 0.0–14.0)
NEUT%: 35.6 % — ABNORMAL LOW (ref 38.4–76.8)
NEUTROS ABS: 0.9 10*3/uL — AB (ref 1.5–6.5)
Platelets: 52 10*3/uL — ABNORMAL LOW (ref 145–400)
RBC: 2.95 10*6/uL — ABNORMAL LOW (ref 3.70–5.45)
RDW: 14.8 % — AB (ref 11.2–14.5)
WBC: 2.4 10*3/uL — AB (ref 3.9–10.3)
lymph#: 1.3 10*3/uL (ref 0.9–3.3)

## 2015-10-17 ENCOUNTER — Other Ambulatory Visit (HOSPITAL_BASED_OUTPATIENT_CLINIC_OR_DEPARTMENT_OTHER): Payer: BC Managed Care – PPO

## 2015-10-17 DIAGNOSIS — C561 Malignant neoplasm of right ovary: Secondary | ICD-10-CM | POA: Diagnosis not present

## 2015-10-17 DIAGNOSIS — C569 Malignant neoplasm of unspecified ovary: Secondary | ICD-10-CM

## 2015-10-17 LAB — CBC WITH DIFFERENTIAL/PLATELET
BASO%: 0 % (ref 0.0–2.0)
Basophils Absolute: 0 10*3/uL (ref 0.0–0.1)
EOS%: 0 % (ref 0.0–7.0)
Eosinophils Absolute: 0 10*3/uL (ref 0.0–0.5)
HCT: 27.8 % — ABNORMAL LOW (ref 34.8–46.6)
HGB: 9.7 g/dL — ABNORMAL LOW (ref 11.6–15.9)
LYMPH%: 24.5 % (ref 14.0–49.7)
MCH: 33.6 pg (ref 25.1–34.0)
MCHC: 34.9 g/dL (ref 31.5–36.0)
MCV: 96.2 fL (ref 79.5–101.0)
MONO#: 0.3 10*3/uL (ref 0.1–0.9)
MONO%: 10.7 % (ref 0.0–14.0)
NEUT%: 64.8 % (ref 38.4–76.8)
NEUTROS ABS: 2.1 10*3/uL (ref 1.5–6.5)
PLATELETS: 57 10*3/uL — AB (ref 145–400)
RBC: 2.89 10*6/uL — AB (ref 3.70–5.45)
RDW: 16 % — ABNORMAL HIGH (ref 11.2–14.5)
WBC: 3.2 10*3/uL — AB (ref 3.9–10.3)
lymph#: 0.8 10*3/uL — ABNORMAL LOW (ref 0.9–3.3)

## 2015-10-17 LAB — COMPREHENSIVE METABOLIC PANEL
ALT: 33 U/L (ref 0–55)
ANION GAP: 13 meq/L — AB (ref 3–11)
AST: 22 U/L (ref 5–34)
Albumin: 3.8 g/dL (ref 3.5–5.0)
Alkaline Phosphatase: 87 U/L (ref 40–150)
BILIRUBIN TOTAL: 0.79 mg/dL (ref 0.20–1.20)
BUN: 13.8 mg/dL (ref 7.0–26.0)
CHLORIDE: 104 meq/L (ref 98–109)
CO2: 23 meq/L (ref 22–29)
CREATININE: 0.6 mg/dL (ref 0.6–1.1)
Calcium: 9.2 mg/dL (ref 8.4–10.4)
GLUCOSE: 100 mg/dL (ref 70–140)
Potassium: 4 mEq/L (ref 3.5–5.1)
SODIUM: 140 meq/L (ref 136–145)
TOTAL PROTEIN: 7.2 g/dL (ref 6.4–8.3)

## 2015-10-24 ENCOUNTER — Other Ambulatory Visit (HOSPITAL_BASED_OUTPATIENT_CLINIC_OR_DEPARTMENT_OTHER): Payer: BC Managed Care – PPO

## 2015-10-24 DIAGNOSIS — C561 Malignant neoplasm of right ovary: Secondary | ICD-10-CM | POA: Diagnosis not present

## 2015-10-24 DIAGNOSIS — C569 Malignant neoplasm of unspecified ovary: Secondary | ICD-10-CM

## 2015-10-24 LAB — CBC WITH DIFFERENTIAL/PLATELET
BASO%: 0.3 % (ref 0.0–2.0)
BASOS ABS: 0 10*3/uL (ref 0.0–0.1)
EOS%: 0 % (ref 0.0–7.0)
Eosinophils Absolute: 0 10*3/uL (ref 0.0–0.5)
HEMATOCRIT: 26.9 % — AB (ref 34.8–46.6)
HEMOGLOBIN: 9.3 g/dL — AB (ref 11.6–15.9)
LYMPH#: 0.7 10*3/uL — AB (ref 0.9–3.3)
LYMPH%: 18.6 % (ref 14.0–49.7)
MCH: 34.1 pg — ABNORMAL HIGH (ref 25.1–34.0)
MCHC: 34.6 g/dL (ref 31.5–36.0)
MCV: 98.5 fL (ref 79.5–101.0)
MONO#: 0.3 10*3/uL (ref 0.1–0.9)
MONO%: 7.2 % (ref 0.0–14.0)
NEUT%: 73.9 % (ref 38.4–76.8)
NEUTROS ABS: 2.7 10*3/uL (ref 1.5–6.5)
Platelets: 104 10*3/uL — ABNORMAL LOW (ref 145–400)
RBC: 2.73 10*6/uL — ABNORMAL LOW (ref 3.70–5.45)
RDW: 18.4 % — AB (ref 11.2–14.5)
WBC: 3.6 10*3/uL — AB (ref 3.9–10.3)

## 2015-10-31 ENCOUNTER — Other Ambulatory Visit (HOSPITAL_BASED_OUTPATIENT_CLINIC_OR_DEPARTMENT_OTHER): Payer: BC Managed Care – PPO

## 2015-10-31 DIAGNOSIS — C569 Malignant neoplasm of unspecified ovary: Secondary | ICD-10-CM

## 2015-10-31 LAB — CBC WITH DIFFERENTIAL/PLATELET
BASO%: 0 % (ref 0.0–2.0)
Basophils Absolute: 0 10*3/uL (ref 0.0–0.1)
EOS%: 0.8 % (ref 0.0–7.0)
Eosinophils Absolute: 0 10*3/uL (ref 0.0–0.5)
HEMATOCRIT: 20.8 % — AB (ref 34.8–46.6)
HEMOGLOBIN: 7.4 g/dL — AB (ref 11.6–15.9)
LYMPH#: 0.6 10*3/uL — AB (ref 0.9–3.3)
LYMPH%: 46.6 % (ref 14.0–49.7)
MCH: 34.7 pg — AB (ref 25.1–34.0)
MCHC: 35.6 g/dL (ref 31.5–36.0)
MCV: 97.7 fL (ref 79.5–101.0)
MONO#: 0 10*3/uL — ABNORMAL LOW (ref 0.1–0.9)
MONO%: 2.3 % (ref 0.0–14.0)
NEUT#: 0.7 10*3/uL — ABNORMAL LOW (ref 1.5–6.5)
NEUT%: 50.3 % (ref 38.4–76.8)
Platelets: 93 10*3/uL — ABNORMAL LOW (ref 145–400)
RBC: 2.13 10*6/uL — ABNORMAL LOW (ref 3.70–5.45)
RDW: 17.4 % — AB (ref 11.2–14.5)
WBC: 1.3 10*3/uL — AB (ref 3.9–10.3)

## 2015-11-04 ENCOUNTER — Other Ambulatory Visit: Payer: Self-pay | Admitting: Gynecologic Oncology

## 2015-11-04 ENCOUNTER — Other Ambulatory Visit (HOSPITAL_BASED_OUTPATIENT_CLINIC_OR_DEPARTMENT_OTHER): Payer: BC Managed Care – PPO

## 2015-11-04 DIAGNOSIS — T451X5A Adverse effect of antineoplastic and immunosuppressive drugs, initial encounter: Principal | ICD-10-CM

## 2015-11-04 DIAGNOSIS — C561 Malignant neoplasm of right ovary: Secondary | ICD-10-CM

## 2015-11-04 DIAGNOSIS — D6481 Anemia due to antineoplastic chemotherapy: Secondary | ICD-10-CM

## 2015-11-04 LAB — COMPREHENSIVE METABOLIC PANEL
ALBUMIN: 3.7 g/dL (ref 3.5–5.0)
ALK PHOS: 85 U/L (ref 40–150)
ALT: 35 U/L (ref 0–55)
ANION GAP: 16 meq/L — AB (ref 3–11)
AST: 27 U/L (ref 5–34)
BUN: 23.2 mg/dL (ref 7.0–26.0)
CALCIUM: 8.6 mg/dL (ref 8.4–10.4)
CHLORIDE: 104 meq/L (ref 98–109)
CO2: 18 mEq/L — ABNORMAL LOW (ref 22–29)
CREATININE: 0.7 mg/dL (ref 0.6–1.1)
EGFR: >90 mL/min/{1.73_m2} (ref >90)
Glucose: 118 mg/dl (ref 70–140)
Potassium: 3.6 mEq/L (ref 3.5–5.1)
Sodium: 138 mEq/L (ref 136–145)
Total Bilirubin: 0.5 mg/dL (ref 0.20–1.20)
Total Protein: 7.6 g/dL (ref 6.4–8.3)

## 2015-11-04 LAB — CBC WITH DIFFERENTIAL/PLATELET
BASO%: 0 % (ref 0.0–2.0)
Basophils Absolute: 0 10*3/uL (ref 0.0–0.1)
EOS ABS: 0 10*3/uL (ref 0.0–0.5)
EOS%: 0.5 % (ref 0.0–7.0)
HEMATOCRIT: 22.9 % — AB (ref 34.8–46.6)
HGB: 8 g/dL — ABNORMAL LOW (ref 11.6–15.9)
LYMPH%: 33.7 % (ref 14.0–49.7)
MCH: 35.1 pg — AB (ref 25.1–34.0)
MCHC: 34.9 g/dL (ref 31.5–36.0)
MCV: 100.4 fL (ref 79.5–101.0)
MONO#: 0.5 10*3/uL (ref 0.1–0.9)
MONO%: 25.4 % — ABNORMAL HIGH (ref 0.0–14.0)
NEUT#: 0.8 10*3/uL — ABNORMAL LOW (ref 1.5–6.5)
NEUT%: 40.4 % (ref 38.4–76.8)
PLATELETS: 140 10*3/uL — AB (ref 145–400)
RBC: 2.28 10*6/uL — ABNORMAL LOW (ref 3.70–5.45)
RDW: 18.5 % — ABNORMAL HIGH (ref 11.2–14.5)
WBC: 2.1 10*3/uL — ABNORMAL LOW (ref 3.9–10.3)
lymph#: 0.7 10*3/uL — ABNORMAL LOW (ref 0.9–3.3)
nRBC: 4 % — ABNORMAL HIGH (ref 0–0)

## 2015-11-05 ENCOUNTER — Telehealth: Payer: Self-pay | Admitting: Gynecologic Oncology

## 2015-11-05 ENCOUNTER — Encounter: Payer: Self-pay | Admitting: Gynecologic Oncology

## 2015-11-05 NOTE — Progress Notes (Signed)
Patient waiting for CBC results , HGB 8.0 , WBC 2.1 . Patient updated with lab results , patient states she been experiencing some "SOB" and "dizziness" upon standing or bending down and standing up. Denies chest pain or fever , states however, that she has been more "fatigued" than usual . Patient did request a work excuse for the rest of the week . Melissa Cross, APNP updated , work was approved per NP. Also writer was instructed to inform the patient to check her "My Chart" for instructions from Dr Kennon Portela. The patient was updated and stated understanding , denied further questions at this time.

## 2015-11-05 NOTE — Telephone Encounter (Signed)
Patient called the office stating she was told by [facility] to have repeat lab work today but she had not heard anything.  She states she has been feeling really sick, super tired, so dizzy, short of breath.  Asking about a blood transfusion.  Advised to come in and have lab work then we would forward the information to Dr. Alycia Rossetti who would make the decision about a tranfusion.  She is to come in today for labwork.  All questions answered.

## 2015-11-07 ENCOUNTER — Telehealth: Payer: Self-pay | Admitting: *Deleted

## 2015-11-07 ENCOUNTER — Other Ambulatory Visit: Payer: BC Managed Care – PPO

## 2015-11-07 NOTE — Telephone Encounter (Signed)
Patient called.  She is due lab work today, however she just got 2 units of blood yesterday and she knows she is neutropenic.  She would like to change this appointment to Monday.  She is already out-of-work, so anytime would be fine.  Please check with Joylene John NP about this.  Patient call back is [phone].  Will route to [facility].

## 2015-11-07 NOTE — Telephone Encounter (Signed)
Completed as requested.  Thank you

## 2015-11-11 ENCOUNTER — Other Ambulatory Visit (HOSPITAL_BASED_OUTPATIENT_CLINIC_OR_DEPARTMENT_OTHER): Payer: BC Managed Care – PPO

## 2015-11-11 DIAGNOSIS — C561 Malignant neoplasm of right ovary: Secondary | ICD-10-CM | POA: Diagnosis not present

## 2015-11-11 DIAGNOSIS — C569 Malignant neoplasm of unspecified ovary: Secondary | ICD-10-CM

## 2015-11-11 LAB — CBC WITH DIFFERENTIAL/PLATELET
BASO%: 0.6 % (ref 0.0–2.0)
BASOS ABS: 0 10*3/uL (ref 0.0–0.1)
EOS%: 0.3 % (ref 0.0–7.0)
Eosinophils Absolute: 0 10*3/uL (ref 0.0–0.5)
HEMATOCRIT: 30.3 % — AB (ref 34.8–46.6)
HEMOGLOBIN: 10.2 g/dL — AB (ref 11.6–15.9)
LYMPH#: 1 10*3/uL (ref 0.9–3.3)
LYMPH%: 25.6 % (ref 14.0–49.7)
MCH: 32.2 pg (ref 25.1–34.0)
MCHC: 33.6 g/dL (ref 31.5–36.0)
MCV: 95.9 fL (ref 79.5–101.0)
MONO#: 0.7 10*3/uL (ref 0.1–0.9)
MONO%: 18.5 % — ABNORMAL HIGH (ref 0.0–14.0)
NEUT#: 2.2 10*3/uL (ref 1.5–6.5)
NEUT%: 55 % (ref 38.4–76.8)
PLATELETS: 133 10*3/uL — AB (ref 145–400)
RBC: 3.16 10*6/uL — ABNORMAL LOW (ref 3.70–5.45)
RDW: 23.8 % — AB (ref 11.2–14.5)
WBC: 4 10*3/uL (ref 3.9–10.3)

## 2015-11-11 LAB — COMPREHENSIVE METABOLIC PANEL
ALBUMIN: 3.8 g/dL (ref 3.5–5.0)
ALK PHOS: 91 U/L (ref 40–150)
ALT: 27 U/L (ref 0–55)
ANION GAP: 12 meq/L — AB (ref 3–11)
AST: 21 U/L (ref 5–34)
BUN: 16 mg/dL (ref 7.0–26.0)
CALCIUM: 8.7 mg/dL (ref 8.4–10.4)
CO2: 23 mEq/L (ref 22–29)
CREATININE: 0.7 mg/dL (ref 0.6–1.1)
Chloride: 105 mEq/L (ref 98–109)
EGFR: >90 mL/min/{1.73_m2} (ref >90)
Glucose: 85 mg/dl (ref 70–140)
POTASSIUM: 3.9 meq/L (ref 3.5–5.1)
Sodium: 140 mEq/L (ref 136–145)
Total Bilirubin: 0.82 mg/dL (ref 0.20–1.20)
Total Protein: 7.2 g/dL (ref 6.4–8.3)

## 2015-11-14 ENCOUNTER — Other Ambulatory Visit (HOSPITAL_BASED_OUTPATIENT_CLINIC_OR_DEPARTMENT_OTHER): Payer: BC Managed Care – PPO

## 2015-11-14 DIAGNOSIS — C561 Malignant neoplasm of right ovary: Secondary | ICD-10-CM | POA: Diagnosis not present

## 2015-11-14 DIAGNOSIS — C569 Malignant neoplasm of unspecified ovary: Secondary | ICD-10-CM

## 2015-11-14 LAB — CBC WITH DIFFERENTIAL/PLATELET
BASO%: 0.2 % (ref 0.0–2.0)
Basophils Absolute: 0 10*3/uL (ref 0.0–0.1)
EOS ABS: 0 10*3/uL (ref 0.0–0.5)
EOS%: 0.2 % (ref 0.0–7.0)
HEMATOCRIT: 30.9 % — AB (ref 34.8–46.6)
HEMOGLOBIN: 10.5 g/dL — AB (ref 11.6–15.9)
LYMPH#: 1.1 10*3/uL (ref 0.9–3.3)
LYMPH%: 17.8 % (ref 14.0–49.7)
MCH: 32.6 pg (ref 25.1–34.0)
MCHC: 34 g/dL (ref 31.5–36.0)
MCV: 96 fL (ref 79.5–101.0)
MONO#: 0.5 10*3/uL (ref 0.1–0.9)
MONO%: 9 % (ref 0.0–14.0)
NEUT%: 72.8 % (ref 38.4–76.8)
NEUTROS ABS: 4.4 10*3/uL (ref 1.5–6.5)
PLATELETS: 106 10*3/uL — AB (ref 145–400)
RBC: 3.22 10*6/uL — ABNORMAL LOW (ref 3.70–5.45)
RDW: 21.1 % — ABNORMAL HIGH (ref 11.2–14.5)
WBC: 6 10*3/uL (ref 3.9–10.3)
nRBC: 0 % (ref 0–0)

## 2015-11-21 ENCOUNTER — Telehealth: Payer: Self-pay | Admitting: *Deleted

## 2015-11-21 ENCOUNTER — Other Ambulatory Visit (HOSPITAL_BASED_OUTPATIENT_CLINIC_OR_DEPARTMENT_OTHER): Payer: BC Managed Care – PPO

## 2015-11-21 ENCOUNTER — Ambulatory Visit: Payer: BC Managed Care – PPO | Admitting: Family Medicine

## 2015-11-21 DIAGNOSIS — C561 Malignant neoplasm of right ovary: Secondary | ICD-10-CM | POA: Diagnosis not present

## 2015-11-21 DIAGNOSIS — C569 Malignant neoplasm of unspecified ovary: Secondary | ICD-10-CM

## 2015-11-21 DIAGNOSIS — Z0289 Encounter for other administrative examinations: Secondary | ICD-10-CM

## 2015-11-21 LAB — CBC WITH DIFFERENTIAL/PLATELET
BASO%: 0 % (ref 0.0–2.0)
Basophils Absolute: 0 10*3/uL (ref 0.0–0.1)
EOS%: 1.2 % (ref 0.0–7.0)
Eosinophils Absolute: 0 10*3/uL (ref 0.0–0.5)
HCT: 28.3 % — ABNORMAL LOW (ref 34.8–46.6)
HGB: 9.7 g/dL — ABNORMAL LOW (ref 11.6–15.9)
LYMPH%: 27.1 % (ref 14.0–49.7)
MCH: 33.2 pg (ref 25.1–34.0)
MCHC: 34.3 g/dL (ref 31.5–36.0)
MCV: 96.9 fL (ref 79.5–101.0)
MONO#: 0.1 10*3/uL (ref 0.1–0.9)
MONO%: 2 % (ref 0.0–14.0)
NEUT#: 1.8 10*3/uL (ref 1.5–6.5)
NEUT%: 69.7 % (ref 38.4–76.8)
Platelets: 82 10*3/uL — ABNORMAL LOW (ref 145–400)
RBC: 2.92 10*6/uL — AB (ref 3.70–5.45)
RDW: 21.5 % — ABNORMAL HIGH (ref 11.2–14.5)
WBC: 2.5 10*3/uL — ABNORMAL LOW (ref 3.9–10.3)
lymph#: 0.7 10*3/uL — ABNORMAL LOW (ref 0.9–3.3)

## 2015-11-21 NOTE — Telephone Encounter (Signed)
"  I'm off work today and come in earlier than 4:00 for lab.  Can I be rescheduled earlier?"  Consulted with high priority scheduler for appointment change.

## 2015-11-28 ENCOUNTER — Other Ambulatory Visit: Payer: BC Managed Care – PPO

## 2015-11-28 ENCOUNTER — Ambulatory Visit (HOSPITAL_BASED_OUTPATIENT_CLINIC_OR_DEPARTMENT_OTHER): Payer: BC Managed Care – PPO

## 2015-11-28 ENCOUNTER — Ambulatory Visit (HOSPITAL_BASED_OUTPATIENT_CLINIC_OR_DEPARTMENT_OTHER): Payer: BC Managed Care – PPO | Admitting: Gynecologic Oncology

## 2015-11-28 ENCOUNTER — Emergency Department (HOSPITAL_COMMUNITY): Payer: BC Managed Care – PPO

## 2015-11-28 ENCOUNTER — Inpatient Hospital Stay (HOSPITAL_COMMUNITY)
Admission: EM | Admit: 2015-11-28 | Discharge: 2015-12-02 | DRG: 871 | Disposition: A | Discharge status: Home / Self Care | Payer: BC Managed Care – PPO | Attending: Internal Medicine | Admitting: Internal Medicine

## 2015-11-28 ENCOUNTER — Other Ambulatory Visit (HOSPITAL_COMMUNITY)
Admission: RE | Admit: 2015-11-28 | Discharge: 2015-11-28 | Disposition: A | Discharge status: Home / Self Care | Payer: BC Managed Care – PPO | Source: Ambulatory Visit | Attending: Gynecologic Oncology | Admitting: Gynecologic Oncology

## 2015-11-28 ENCOUNTER — Other Ambulatory Visit: Payer: Self-pay | Admitting: Gynecologic Oncology

## 2015-11-28 ENCOUNTER — Encounter: Payer: Self-pay | Admitting: Gynecologic Oncology

## 2015-11-28 ENCOUNTER — Encounter (HOSPITAL_COMMUNITY): Payer: Self-pay

## 2015-11-28 ENCOUNTER — Telehealth: Payer: Self-pay | Admitting: Gynecologic Oncology

## 2015-11-28 VITALS — BP 84/58 | HR 100 | Temp 100.5°F | Resp 18

## 2015-11-28 DIAGNOSIS — I10 Essential (primary) hypertension: Secondary | ICD-10-CM | POA: Diagnosis present

## 2015-11-28 DIAGNOSIS — R6520 Severe sepsis without septic shock: Secondary | ICD-10-CM | POA: Diagnosis present

## 2015-11-28 DIAGNOSIS — I9589 Other hypotension: Secondary | ICD-10-CM

## 2015-11-28 DIAGNOSIS — R231 Pallor: Secondary | ICD-10-CM

## 2015-11-28 DIAGNOSIS — Z955 Presence of coronary angioplasty implant and graft: Secondary | ICD-10-CM | POA: Diagnosis not present

## 2015-11-28 DIAGNOSIS — R059 Cough, unspecified: Secondary | ICD-10-CM

## 2015-11-28 DIAGNOSIS — I2510 Atherosclerotic heart disease of native coronary artery without angina pectoris: Secondary | ICD-10-CM | POA: Diagnosis present

## 2015-11-28 DIAGNOSIS — Z807 Family history of other malignant neoplasms of lymphoid, hematopoietic and related tissues: Secondary | ICD-10-CM | POA: Diagnosis not present

## 2015-11-28 DIAGNOSIS — R6889 Other general symptoms and signs: Secondary | ICD-10-CM

## 2015-11-28 DIAGNOSIS — C561 Malignant neoplasm of right ovary: Secondary | ICD-10-CM | POA: Diagnosis present

## 2015-11-28 DIAGNOSIS — R05 Cough: Secondary | ICD-10-CM

## 2015-11-28 DIAGNOSIS — R509 Fever, unspecified: Secondary | ICD-10-CM

## 2015-11-28 DIAGNOSIS — Z803 Family history of malignant neoplasm of breast: Secondary | ICD-10-CM

## 2015-11-28 DIAGNOSIS — A4189 Other specified sepsis: Secondary | ICD-10-CM | POA: Diagnosis not present

## 2015-11-28 DIAGNOSIS — C569 Malignant neoplasm of unspecified ovary: Secondary | ICD-10-CM | POA: Insufficient documentation

## 2015-11-28 DIAGNOSIS — R5081 Fever presenting with conditions classified elsewhere: Secondary | ICD-10-CM | POA: Diagnosis present

## 2015-11-28 DIAGNOSIS — E785 Hyperlipidemia, unspecified: Secondary | ICD-10-CM | POA: Diagnosis present

## 2015-11-28 DIAGNOSIS — E871 Hypo-osmolality and hyponatremia: Secondary | ICD-10-CM | POA: Diagnosis present

## 2015-11-28 DIAGNOSIS — Z888 Allergy status to other drugs, medicaments and biological substances status: Secondary | ICD-10-CM

## 2015-11-28 DIAGNOSIS — B9620 Unspecified Escherichia coli [E. coli] as the cause of diseases classified elsewhere: Secondary | ICD-10-CM | POA: Diagnosis not present

## 2015-11-28 DIAGNOSIS — E876 Hypokalemia: Secondary | ICD-10-CM | POA: Diagnosis present

## 2015-11-28 DIAGNOSIS — N390 Urinary tract infection, site not specified: Secondary | ICD-10-CM | POA: Diagnosis present

## 2015-11-28 DIAGNOSIS — T451X5A Adverse effect of antineoplastic and immunosuppressive drugs, initial encounter: Secondary | ICD-10-CM

## 2015-11-28 DIAGNOSIS — D61810 Antineoplastic chemotherapy induced pancytopenia: Secondary | ICD-10-CM | POA: Diagnosis present

## 2015-11-28 DIAGNOSIS — G629 Polyneuropathy, unspecified: Secondary | ICD-10-CM | POA: Diagnosis present

## 2015-11-28 DIAGNOSIS — Z8249 Family history of ischemic heart disease and other diseases of the circulatory system: Secondary | ICD-10-CM

## 2015-11-28 DIAGNOSIS — R000 Tachycardia, unspecified: Secondary | ICD-10-CM

## 2015-11-28 DIAGNOSIS — I252 Old myocardial infarction: Secondary | ICD-10-CM

## 2015-11-28 DIAGNOSIS — D61818 Other pancytopenia: Secondary | ICD-10-CM | POA: Diagnosis not present

## 2015-11-28 DIAGNOSIS — F909 Attention-deficit hyperactivity disorder, unspecified type: Secondary | ICD-10-CM | POA: Diagnosis present

## 2015-11-28 DIAGNOSIS — A4151 Sepsis due to Escherichia coli [E. coli]: Secondary | ICD-10-CM | POA: Diagnosis present

## 2015-11-28 DIAGNOSIS — Z79899 Other long term (current) drug therapy: Secondary | ICD-10-CM

## 2015-11-28 DIAGNOSIS — Z9221 Personal history of antineoplastic chemotherapy: Secondary | ICD-10-CM

## 2015-11-28 DIAGNOSIS — D708 Other neutropenia: Secondary | ICD-10-CM | POA: Diagnosis not present

## 2015-11-28 DIAGNOSIS — Z87891 Personal history of nicotine dependence: Secondary | ICD-10-CM | POA: Diagnosis not present

## 2015-11-28 DIAGNOSIS — D709 Neutropenia, unspecified: Secondary | ICD-10-CM

## 2015-11-28 DIAGNOSIS — Z9225 Personal history of immunosupression therapy: Secondary | ICD-10-CM

## 2015-11-28 DIAGNOSIS — Z7982 Long term (current) use of aspirin: Secondary | ICD-10-CM

## 2015-11-28 DIAGNOSIS — R0602 Shortness of breath: Secondary | ICD-10-CM | POA: Diagnosis not present

## 2015-11-28 DIAGNOSIS — A419 Sepsis, unspecified organism: Secondary | ICD-10-CM

## 2015-11-28 DIAGNOSIS — G620 Drug-induced polyneuropathy: Secondary | ICD-10-CM

## 2015-11-28 DIAGNOSIS — B349 Viral infection, unspecified: Secondary | ICD-10-CM

## 2015-11-28 DIAGNOSIS — I959 Hypotension, unspecified: Secondary | ICD-10-CM | POA: Diagnosis not present

## 2015-11-28 LAB — URINALYSIS, ROUTINE W REFLEX MICROSCOPIC
BILIRUBIN URINE: NEGATIVE
GLUCOSE, UA: NEGATIVE mg/dL
Ketones, ur: NEGATIVE mg/dL
Nitrite: NEGATIVE
PH: 6 (ref 5.0–8.0)
Protein, ur: 100 mg/dL — AB
SPECIFIC GRAVITY, URINE: 1.027 (ref 1.005–1.030)

## 2015-11-28 LAB — COMPREHENSIVE METABOLIC PANEL
ALBUMIN: 3.5 g/dL (ref 3.5–5.0)
ALK PHOS: 93 U/L (ref 40–150)
ALT: 26 U/L (ref 0–55)
AST: 20 U/L (ref 5–34)
Anion Gap: 12 mEq/L — ABNORMAL HIGH (ref 3–11)
BILIRUBIN TOTAL: 0.68 mg/dL (ref 0.20–1.20)
BUN: 12.1 mg/dL (ref 7.0–26.0)
CALCIUM: 8.9 mg/dL (ref 8.4–10.4)
CO2: 19 mEq/L — ABNORMAL LOW (ref 22–29)
Chloride: 102 mEq/L (ref 98–109)
Creatinine: 0.7 mg/dL (ref 0.6–1.1)
Glucose: 141 mg/dl — ABNORMAL HIGH (ref 70–140)
POTASSIUM: 3.5 meq/L (ref 3.5–5.1)
Sodium: 133 mEq/L — ABNORMAL LOW (ref 136–145)
TOTAL PROTEIN: 7.4 g/dL (ref 6.4–8.3)

## 2015-11-28 LAB — CBC WITH DIFFERENTIAL/PLATELET
BASO%: 0 % (ref 0.0–2.0)
BASOS ABS: 0 10*3/uL (ref 0.0–0.1)
EOS ABS: 0 10*3/uL (ref 0.0–0.5)
EOS%: 1.6 % (ref 0.0–7.0)
HEMATOCRIT: 26 % — AB (ref 34.8–46.6)
HEMOGLOBIN: 8.8 g/dL — AB (ref 11.6–15.9)
LYMPH#: 0.4 10*3/uL — AB (ref 0.9–3.3)
LYMPH%: 23.1 % (ref 14.0–49.7)
MCH: 33.1 pg (ref 25.1–34.0)
MCHC: 33.8 g/dL (ref 31.5–36.0)
MCV: 97.7 fL (ref 79.5–101.0)
MONO#: 0.5 10*3/uL (ref 0.1–0.9)
MONO%: 24.7 % — ABNORMAL HIGH (ref 0.0–14.0)
NEUT#: 0.9 10*3/uL — ABNORMAL LOW (ref 1.5–6.5)
NEUT%: 50.6 % (ref 38.4–76.8)
Platelets: 65 10*3/uL — ABNORMAL LOW (ref 145–400)
RBC: 2.66 10*6/uL — ABNORMAL LOW (ref 3.70–5.45)
RDW: 21.8 % — AB (ref 11.2–14.5)
WBC: 1.8 10*3/uL — ABNORMAL LOW (ref 3.9–10.3)

## 2015-11-28 LAB — I-STAT CG4 LACTIC ACID, ED: Lactic Acid, Venous: 1.79 mmol/L (ref 0.5–1.9)

## 2015-11-28 LAB — URINE MICROSCOPIC-ADD ON

## 2015-11-28 LAB — MRSA PCR SCREENING: MRSA BY PCR: NEGATIVE

## 2015-11-28 LAB — INFLUENZA PANEL BY PCR (TYPE A & B)
Influenza A By PCR: NEGATIVE
Influenza B By PCR: NEGATIVE

## 2015-11-28 MED ORDER — ONDANSETRON HCL 4 MG/2ML IJ SOLN
4.0000 mg | Freq: Four times a day (QID) | INTRAMUSCULAR | Status: DC | PRN
  Administered 2015-11-28: 4 mg via INTRAVENOUS
  Filled 2015-11-28: qty 2

## 2015-11-28 MED ORDER — ACETAMINOPHEN 650 MG RE SUPP: 650.0000 mg | Freq: Four times a day (QID) | RECTAL | Status: DC | PRN

## 2015-11-28 MED ORDER — SODIUM CHLORIDE 0.9 % IV BOLUS (SEPSIS)
1000.0000 mL | Freq: Once | INTRAVENOUS | Status: AC
Start: 2015-11-28 — End: 2015-11-28
  Administered 2015-11-28: 1000 mL via INTRAVENOUS

## 2015-11-28 MED ORDER — SODIUM CHLORIDE 0.9 % IV BOLUS (SEPSIS)
1000.0000 mL | Freq: Once | INTRAVENOUS | Status: AC
  Administered 2015-11-28: 1000 mL via INTRAVENOUS

## 2015-11-28 MED ORDER — VANCOMYCIN HCL IN DEXTROSE 1-5 GM/200ML-% IV SOLN
1000.0000 mg | Freq: Once | INTRAVENOUS | Status: AC
  Administered 2015-11-28: 1000 mg via INTRAVENOUS
  Filled 2015-11-28: qty 200

## 2015-11-28 MED ORDER — VANCOMYCIN HCL IN DEXTROSE 1-5 GM/200ML-% IV SOLN
1000.0000 mg | Freq: Three times a day (TID) | INTRAVENOUS | Status: DC
  Administered 2015-11-29 – 2015-12-01 (×6): 1000 mg via INTRAVENOUS
  Filled 2015-11-28 (×6): qty 200

## 2015-11-28 MED ORDER — HYDROCODONE-HOMATROPINE 5-1.5 MG/5ML PO SYRP
5.0000 mL | ORAL_SOLUTION | Freq: Four times a day (QID) | ORAL | Status: DC | PRN
  Administered 2015-11-28 – 2015-12-02 (×10): 5 mL via ORAL
  Filled 2015-11-28 (×10): qty 5

## 2015-11-28 MED ORDER — OXYCODONE HCL 5 MG PO TABS
5.0000 mg | ORAL_TABLET | ORAL | Status: DC | PRN
  Administered 2015-11-28 – 2015-12-02 (×12): 5 mg via ORAL
  Filled 2015-11-28 (×12): qty 1

## 2015-11-28 MED ORDER — DEXTROSE 5 % IV SOLN
860.0000 mg | Freq: Three times a day (TID) | INTRAVENOUS | Status: DC
  Filled 2015-11-28: qty 17.2

## 2015-11-28 MED ORDER — TRAMADOL HCL 50 MG PO TABS
50.0000 mg | ORAL_TABLET | Freq: Four times a day (QID) | ORAL | Status: DC | PRN
  Administered 2015-11-30: 50 mg via ORAL
  Filled 2015-11-28: qty 1

## 2015-11-28 MED ORDER — ONDANSETRON HCL 4 MG PO TABS
4.0000 mg | ORAL_TABLET | Freq: Four times a day (QID) | ORAL | Status: DC | PRN
Start: 2015-11-28 — End: 2015-12-02

## 2015-11-28 MED ORDER — ALBUTEROL SULFATE (2.5 MG/3ML) 0.083% IN NEBU: 3.0000 mL | INHALATION_SOLUTION | Freq: Four times a day (QID) | RESPIRATORY_TRACT | Status: DC | PRN

## 2015-11-28 MED ORDER — ACETAMINOPHEN 325 MG PO TABS
650.0000 mg | ORAL_TABLET | Freq: Four times a day (QID) | ORAL | Status: DC | PRN
  Administered 2015-11-28 – 2015-11-30 (×4): 650 mg via ORAL
  Filled 2015-11-28 (×4): qty 2

## 2015-11-28 MED ORDER — DEXTROSE 5 % IV SOLN
2.0000 g | Freq: Three times a day (TID) | INTRAVENOUS | Status: DC
  Administered 2015-11-29 – 2015-12-01 (×8): 2 g via INTRAVENOUS
  Filled 2015-11-28 (×9): qty 2

## 2015-11-28 MED ORDER — LACTATED RINGERS IV SOLN
INTRAVENOUS | Status: AC
  Administered 2015-11-28: 20:00:00 via INTRAVENOUS

## 2015-11-28 MED ORDER — SODIUM CHLORIDE 0.9% FLUSH
10.0000 mL | INTRAVENOUS | Status: DC | PRN
  Administered 2015-12-02: 10 mL
  Filled 2015-11-28: qty 40

## 2015-11-28 MED ORDER — HYDROCODONE-HOMATROPINE 5-1.5 MG/5ML PO SYRP
5.0000 mL | ORAL_SOLUTION | Freq: Four times a day (QID) | ORAL | 0 refills | Status: DC | PRN
Start: 2015-11-28 — End: 2016-01-06

## 2015-11-28 MED ORDER — LIP MEDEX EX OINT
TOPICAL_OINTMENT | CUTANEOUS | Status: AC
Start: 2015-11-28 — End: 2015-11-29
  Administered 2015-11-29
  Filled 2015-11-28: qty 7

## 2015-11-28 MED ORDER — MORPHINE SULFATE (PF) 2 MG/ML IV SOLN
2.0000 mg | INTRAVENOUS | Status: DC | PRN
  Administered 2015-11-30: 2 mg via INTRAVENOUS
  Filled 2015-11-28: qty 1

## 2015-11-28 MED ORDER — DEXTROSE 5 % IV SOLN
660.0000 mg | Freq: Three times a day (TID) | INTRAVENOUS | Status: DC
  Administered 2015-11-29 – 2015-11-30 (×5): 660 mg via INTRAVENOUS
  Filled 2015-11-28 (×6): qty 13.2

## 2015-11-28 MED ORDER — VENLAFAXINE HCL ER 150 MG PO CP24
150.0000 mg | ORAL_CAPSULE | Freq: Every day | ORAL | Status: DC
  Administered 2015-11-29 – 2015-12-02 (×4): 150 mg via ORAL
  Filled 2015-11-28: qty 2
  Filled 2015-11-28: qty 1
  Filled 2015-11-28 (×2): qty 2

## 2015-11-28 MED ORDER — KETOROLAC TROMETHAMINE 15 MG/ML IJ SOLN
15.0000 mg | Freq: Once | INTRAMUSCULAR | Status: AC
  Administered 2015-11-28: 15 mg via INTRAVENOUS
  Filled 2015-11-28: qty 1

## 2015-11-28 MED ORDER — DEXTROSE 5 % IV SOLN
860.0000 mg | INTRAVENOUS | Status: AC
  Administered 2015-11-28: 860 mg via INTRAVENOUS
  Filled 2015-11-28: qty 17.2

## 2015-11-28 MED ORDER — METOCLOPRAMIDE HCL 5 MG/ML IJ SOLN
10.0000 mg | Freq: Once | INTRAMUSCULAR | Status: AC
  Administered 2015-11-28: 10 mg via INTRAVENOUS
  Filled 2015-11-28: qty 2

## 2015-11-28 MED ORDER — CEFEPIME HCL 2 G IJ SOLR
2.0000 g | INTRAMUSCULAR | Status: AC
Start: 2015-11-28 — End: 2015-11-28
  Administered 2015-11-28: 2 g via INTRAVENOUS
  Filled 2015-11-28: qty 2

## 2015-11-28 NOTE — ED Triage Notes (Signed)
Pt c/o fever, sore throat, cough, chills, and generalized body aches x 6 days.  Pain score 7/10.  Pt reports taking tylenol and ibuprofen around 1420.  Hx of ovarian CA.  Last chemo 10/30.  Pt is followed by an Materials engineer  In [employer] and has blood work every Thursday at [facility].       Pt had blood work this afternoon prior to arrival.

## 2015-11-28 NOTE — ED Notes (Signed)
Patient transported to CT 

## 2015-11-28 NOTE — ED Provider Notes (Addendum)
[facility] DEPT Provider Note   CSN: [id number] Arrival date & time: 11/28/15  1506     History   Chief Complaint Chief Complaint  Patient presents with  . CA Pt  . Fever  . Fatigue  . Sore Throat    HPI Shelley Vega is a 35 y.o. female.  HPI Pt comes in with cc of low blood pressure and feeling unwell. PT has hx of ovarian tumor and she is getting active chemo. Pt reports that last week she started having common cold like symptoms - congestion + cough + sore throat. Pt has gotten worse over the last 2-3 days, and went to the cancer center today for her blood work and was found to have hypotension - thus asked to come to the ER. Pt reports that she has been having fevers and chills. She also has been having malaise, poor appetite and generalized body aches. Pt is also having 8/10 headache, but denies any associated neurologic complains or neck rigidity. + cough - but it is dry.  Past Medical History:  Diagnosis Date  . Heart attack   . 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 Genetic testing 08/21/2014  . Family history of breast cancer in female 08/21/2014  . Family history of cancer 08/21/2014  . Arteriosclerosis of coronary artery 07/25/2014  . Dyslipidemia 07/25/2014  . Cancer of ovary ([place]) 07/23/2014  . Post-operative state 07/15/2014  . Apnea, sleep 06/13/2013  . Acute non-ST segment elevation myocardial infarction ([place]) 05/09/2013  . ADD (attention deficit disorder) 04/25/2013  . Airway hyperreactivity 04/25/2013  . BP (high blood pressure) 04/25/2013  . Class 1 obesity 04/25/2013    Past Surgical History:  Procedure Laterality Date  . ABDOMINAL HYSTERECTOMY    . BREAST REDUCTION SURGERY    . CORONARY ANGIOPLASTY WITH STENT PLACEMENT    . MOUTH SURGERY      OB History    No data available       Home Medications    Prior to Admission medications   Medication Sig Start Date End Date  Taking? Authorizing Provider  acetaminophen (TYLENOL) 500 MG tablet Take 1,000 mg by mouth every 6 (six) hours as needed for mild pain, moderate pain, fever or headache.   Yes Historical Provider, MD  albuterol (PROVENTIL HFA;VENTOLIN HFA) 108 (90 BASE) MCG/ACT inhaler Inhale 2 puffs into the lungs every 6 (six) hours as needed for wheezing or shortness of breath.   Yes Historical Provider, MD  ALPRAZolam Duanne Moron) 1 MG tablet Take 1 mg by mouth 3 (three) times daily as needed for anxiety.   Yes Historical Provider, MD  amphetamine-dextroamphetamine (ADDERALL XR) 30 MG 24 hr capsule Take 30 mg by mouth daily with breakfast. Only takes when she is at school   Yes Historical Provider, MD  amphetamine-dextroamphetamine (ADDERALL) 20 MG tablet Take 20 mg by mouth daily with lunch. Only takes when she is at school   Yes Historical Provider, MD  aspirin 81 MG chewable tablet Chew 81 mg by mouth daily with breakfast.    Yes Historical Provider, MD  atorvastatin (LIPITOR) 40 MG tablet Take 40 mg by mouth at bedtime.    Yes Historical Provider, MD  b complex vitamins tablet Take 1 tablet by mouth daily.   Yes Historical Provider, MD  cetirizine (ZYRTEC) 10 MG tablet Take 10 mg by mouth daily with breakfast.    Yes Historical Provider, MD  clonazePAM (KLONOPIN) 1 MG tablet Take 1 mg by mouth 3 (three) times daily as needed for anxiety.    Yes Historical Provider, MD  dexamethasone (DECADRON) 4 MG tablet Take 8 mg by mouth daily with breakfast. Pt only takes this medication on days 2,3, and 4 of chemo.   Yes Historical Provider, MD  ibuprofen (ADVIL,MOTRIN) 200 MG tablet Take 400 mg by mouth every 6 (six) hours as needed for fever, headache, mild pain, moderate pain or cramping.   Yes Historical Provider, MD  losartan (COZAAR) 50 MG tablet Take 100 mg by mouth daily with breakfast.    Yes Historical Provider, MD  magnesium oxide (MAGNESIUM-OXIDE) 400 (241.3 Mg) MG tablet Take 400 mg by mouth 2 (two) times daily.    Yes Historical Provider, MD  metoprolol succinate (TOPROL-XL) 100 MG 24 hr tablet Take 100 mg by mouth daily with breakfast. Take with or immediately following a meal.    Yes Historical Provider, MD  Multiple Vitamin (MULTIVITAMIN WITH MINERALS) TABS tablet Take 1 tablet by mouth daily with breakfast.    Yes Historical Provider, MD  nitroGLYCERIN (NITROSTAT) 0.4 MG SL tablet Place 0.4 mg under the tongue every 5 (five) minutes as needed for chest pain.   Yes Historical Provider, MD  Omega-3 Fatty Acids (FISH OIL PO) Take 1 capsule by mouth daily.   Yes Historical Provider, MD  oxyCODONE (OXY IR/ROXICODONE) 5 MG immediate release tablet Take 5 mg by mouth every 6 (six) hours as needed for severe pain (Only takes during chemo).    Yes Historical Provider, MD  potassium chloride SA (K-DUR,KLOR-CON) 20 MEQ tablet Take 20 mEq by mouth daily with breakfast.   Yes Historical Provider, MD  prasugrel (EFFIENT) 10 MG TABS tablet Take 10 mg by mouth daily with breakfast.    Yes Historical Provider, MD  venlafaxine XR (EFFEXOR-XR) 150 MG 24 hr capsule Take 150 mg by mouth daily with breakfast.  10/21/15 10/20/16 Yes Historical Provider, MD  HYDROcodone-homatropine (HYCODAN) 5-1.5 MG/5ML syrup Take 5 mLs by mouth every 6 (six) hours as needed for cough. Patient not taking: Reported on 11/28/2015 11/28/15   Dorothyann Gibbs, NP  promethazine (PHENERGAN) 25 MG tablet Take 1 tablet (25 mg total) by mouth every 6 (six) hours as needed for nausea or vomiting. Patient not taking: Reported on 11/28/2015 08/26/15   Leo Grosser, MD  Zinc Oxide (DESITIN) 40 % PSTE Apply 1 application topically 2 (two) times daily as needed (skin breakdown). Patient not taking: Reported on 11/28/2015 08/26/15   Leo Grosser, MD    Family History Family History  Problem Relation Age of Onset  . Colon polyps Father     about 2-3 removed following colonoscopy  . Irritable bowel syndrome Brother     1 previous colonoscopy - no polyps found  .  Breast cancer Paternal Aunt 88    "low level" breast cancer  . Prostate cancer Maternal Grandfather 35  . Lymphoma Maternal Grandfather 71    large B-cell lymphoma  . Heart Problems Paternal Grandmother   . Heart attack Paternal Grandfather   . Cancer Other 68    unknown type    Social History Social History  Substance Use Topics  . Smoking status: Former Smoker    Packs/day: 1.00    Years: 8.00    Types: Cigarettes    Quit date: 08/19/2011  . Smokeless tobacco: Never Used  . Alcohol use 0.0 oz/week     Comment: soc - maybe 3 drinks per  month     Allergies   Lisinopril   Review of Systems Review of Systems  ROS 10 Systems reviewed and are negative for acute change except as noted in the HPI.   Physical Exam Updated Vital Signs BP (!) 90/49 (BP Location: Right Arm)   Pulse 85   Temp 98.2 F (36.8 C) (Oral)   Resp 20   Ht 5\' 3"  (1.6 m)   Wt 190 lb (86.2 kg)   LMP 08/27/2014   SpO2 95%   BMI 33.66 kg/m   Physical Exam  Constitutional: She is oriented to person, place, and time. She appears well-developed.  HENT:  Head: Normocephalic and atraumatic.  Eyes: Conjunctivae and EOM are normal. Pupils are equal, round, and reactive to light.  Neck: Normal range of motion. Neck supple.  Cardiovascular: Normal rate, regular rhythm and normal heart sounds.   Pulmonary/Chest: Effort normal and breath sounds normal. No respiratory distress.  Abdominal: Soft. Bowel sounds are normal. She exhibits no distension. There is no tenderness. There is no rebound and no guarding.  Neurological: She is alert and oriented to person, place, and time.  Skin: Skin is warm and dry.  Nursing note and vitals reviewed.    ED Treatments / Results  Labs (all labs ordered are listed, but only abnormal results are displayed) Labs Reviewed  URINALYSIS, ROUTINE W REFLEX MICROSCOPIC (NOT AT [facility]) - Abnormal; Notable for the following:       Result Value   Color, Urine AMBER (*)     APPearance TURBID (*)    Hgb urine dipstick TRACE (*)    Protein, ur 100 (*)    Leukocytes, UA LARGE (*)    All other components within normal limits  URINE MICROSCOPIC-ADD ON - Abnormal; Notable for the following:    Squamous Epithelial / LPF 6-30 (*)    Bacteria, UA MANY (*)    All other components within normal limits  URINE CULTURE  I-STAT CG4 LACTIC ACID, ED  I-STAT CG4 LACTIC ACID, ED    EKG  EKG Interpretation None       Radiology Dg Chest 2 View  Result Date: 11/28/2015 CLINICAL DATA:  Fever, sore throat, cough and chills. Myalgias. History of ovarian cancer. EXAM: CHEST  2 VIEW COMPARISON:  None. FINDINGS: Right IJ port catheter tip mid SVC level. Normal heart size and vascularity. No focal pneumonia, collapse or consolidation. Negative for edema, effusion or pneumothorax. Minor central bronchitic change. Trachea is midline. No significant osseous finding. Coronary stents present. IMPRESSION: Mild central bronchitic change.  No superimposed acute process. Electronically Signed   By: Jerilynn Mages.  Shick M.D.   On: 11/28/2015 16:40   Ct Head Wo Contrast  Result Date: 11/28/2015 CLINICAL DATA:  36 y/o F; dizziness with severe headache. Patient undergoing chemotherapy for ovarian cancer. EXAM: CT HEAD WITHOUT CONTRAST TECHNIQUE: Contiguous axial images were obtained from the base of the skull through the vertex without intravenous contrast. COMPARISON:  None available. FINDINGS: Brain: No evidence of acute infarction, hemorrhage, hydrocephalus, extra-axial collection or mass lesion/mass effect. Vascular: No hyperdense vessel or unexpected calcification. Skull: Normal. Negative for fracture or focal lesion. Sinuses/Orbits: No acute finding. Other: None. IMPRESSION: No acute intracranial abnormality identified. Unremarkable CT of the head for age. Electronically Signed   By: Kristine Garbe M.D.   On: 11/28/2015 17:05    Procedures .Critical Care Performed by: Varney Biles Authorized by: Varney Biles   Critical care provider statement:    Critical care time (minutes):  50   Critical care time was exclusive of:  Separately billable procedures and treating other patients   Critical care was necessary to treat or prevent imminent or life-threatening deterioration of the following conditions:  Circulatory failure and sepsis   Critical care was time spent personally by me on the following activities:  Blood draw for specimens, development of treatment plan with patient or surrogate, discussions with consultants, examination of patient, ordering and performing treatments and interventions, obtaining history from patient or surrogate, ordering and review of laboratory studies, ordering and review of radiographic studies, pulse oximetry, re-evaluation of patient's condition and review of old charts   (including critical care time)  Medications Ordered in ED Medications  vancomycin (VANCOCIN) IVPB 1000 mg/200 mL premix (not administered)  sodium chloride 0.9 % bolus 1,000 mL (0 mLs Intravenous Stopped 11/28/15 1759)    And  sodium chloride 0.9 % bolus 1,000 mL (1,000 mLs Intravenous New Bag/Given 11/28/15 1721)    And  sodium chloride 0.9 % bolus 1,000 mL (1,000 mLs Intravenous New Bag/Given 11/28/15 1734)  ceFEPIme (MAXIPIME) 2 g in dextrose 5 % 50 mL IVPB (2 g Intravenous New Bag/Given 11/28/15 1721)  ketorolac (TORADOL) 15 MG/ML injection 15 mg (15 mg Intravenous Given 11/28/15 1734)  metoCLOPramide (REGLAN) injection 10 mg (10 mg Intravenous Given 11/28/15 1735)     Initial Impression / Assessment and Plan / ED Course  I have reviewed the triage vital signs and the nursing notes.  Pertinent labs & imaging results that were available during my care of the patient were reviewed by me and considered in my medical decision making (see chart for details).  Clinical Course as of Nov 27 1816  Thu Nov 28, 2015  1815 Sepsis - Repeat Assessment  Performed  at:    6:15 pm  Vitals     @VITALSNOTREFRESHABLE @  Heart:     Regular rate and rhythm  Lungs:    CTA and Rales  Capillary Refill:   <2 sec  Peripheral Pulse:   Radial pulse palpable  Skin:     warm   [AN]  1817 UA has +++ WBCs. We will admit. BP improved slightly - 90s SBP. Stepdown should be fine, unless hospitalist wants icu.  [AN]    Clinical Course User Index [AN] Varney Biles, MD    Pt comes in with cc of fevers, low BP. She has URI like symptoms. Hx and exam is also + for headaches and generalized body aches, but other than that, she has no focality, drawing attention to a specific source of infection.  Source of infection could be just influenza or other viral source.  Bacterial meningitis is deemed less likely.  We will give cefepime.  Despite the low BP, pt is alert, and perfusing her brain well. Lactate is normal. INITIAL LABS HAVE BEEN SENT OUT FROM THE CANCER CENTER.  Given the headache - we will consider starting acyclovir to cover for possible viral meningitis. CXR is clear. We will add vancomycin for broader coverage.   Final Clinical Impressions(s) / ED Diagnoses   Final diagnoses:  Hypotension, unspecified hypotension type  Fever, unknown origin  Personal history of immunosupression therapy  Viral infection  Severe sepsis [facility])    New Prescriptions New Prescriptions   No medications on file     Varney Biles, MD 11/28/15 [place], MD 11/28/15 1818

## 2015-11-28 NOTE — Telephone Encounter (Signed)
Patient called stating she is "not good at all."  "[place] not go [place] the mountains last weekend because I felt like I was coming down with something.  I felt awful over the weekend with my muscles feeling like I had been kicked everywhere.  I had my parents come and pick me up last night because I could barely move."  Temp last pm was 100.2 with cough (slightly productive last pm) and sore throat.  Last chemo per pt was Oct 30.  She is supposed to come in for labs today but is concerned about exposing patients.  Dr. Alycia Rossetti and her RN at [facility] notified of the situation.  They would like the patient to be tested for the flu.  Patient to come in today for testing.

## 2015-11-28 NOTE — ED Notes (Signed)
Hospitalist at bedside 

## 2015-11-28 NOTE — Progress Notes (Signed)
Follow Up Note: Gyn-Onc Symptom Visit  Shelley Vega 35 y.o. female  CC:  Chief Complaint  Patient presents with  . Fever, chills, body aches    Follow up, [facility] chemo pt    HPI:  Shelley Vega is a 35 year old female initially seen in consultation at the request of Shelley Vega for evaluation of pelvic mass. She presented in May with some odor with urination, vaginal discharge and STD testing. She reports regular periods without intermenstrual bleeding that states they've become much heavier for the past couple of months. She was subsequently diagnosed with a large abdominal mass.  Patients cardiologist is Dr. Nolberto Hanlon [phone]) in [place] associated with [place].   INTERVAL HISTORY:  Imaging:  CT A/P (06/19/14)  20cm pelvic mass arising from the right ovary- complex cystic mass complicated by internal areas of septation and large dystrophic calcifications. Favor ovarian teratoma. Trace fluid in pelvic, no peritoneal nodularity identified, no abdominal adenopathy, right pelvic lymph node measuring 1cm.  Oncology Summary:          Malignant neoplasm of right ovary (RAF-HCC)    07/13/2014  Initial Diagnosis  Malignant neoplasm of right ovary (RAF-HCC)    07/13/2014  Surgery  Exploratory laparotomy with right salpingoophorectomy, right ureterolysis, infracolic omentectomy, right pelvic and aortic lymph node dissection, and staging biopsies   Findings:  1) 20cm right adnexal mass adherent to the mesentery and right pelvic sidewall - frozen section evaluation showing at least borderline tumor with extensive calcifications  2) Retroperitoneal fibrosis requiring right ureterolysis  3) Normal abdominal survey with normal appendix, large bowel, small bowel (run from terminal ileum to ligament of Treitz), smooth diaphragms bilaterally, smooth liver edge, normal appearing omentum and stomach  4) Normal appearing left tube and ovary  5) Normal uterus      Final Diagnosis    A: Ovary, right,  oophorectomy  - 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 (please see synoptic report and comment)  B: Pelvic adhesion, excision  - Involved by serous carcinoma, largest focus measures 0.7 cm  C: Omentum, omentectomy  - Involved by serous carcinoma, focus measures 0.3 cm  D: Peritoneum, pelvic nodule, excision  - No serous carcinoma identified  - Fibroadipose tissue with chronic inflammation and focal calcification  E: Lymph nodes, pelvic, right, resection  - Nine lymph nodes negative for metastatic carcinoma (0/9)  F: Peritoneum, posterior adhesion, biopsy  - No serous carcinoma identified  - Fibrovascular tissue with hemorrhage  G: Lymph node, para-aortic, excision  - Four lymph nodes negative for metastatic carcinoma (0/4)  H: Peritoneum, gutter, right, biopsy  - No serous carcinoma identified  - Fibroadipose tissue with chronic inflammation  I: Peritoneum, gutter, left, biopsy  - No serous carcinoma identified  - Fibroadipose tissue  Stage/Disposition: Shelley Vega is a 35 y.o. 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 Disposition is to carboplatin and taxol chemotherapy.  She completed all of her therapy and had a negative post treatment CT scan (12/24/14):  IMPRESSION: - No CT evidence of metastatic disease in the chest, abdomen, or pelvis. Previously described nodularity in the right adnexal region is slightly less prominent on current exam and may reflect postsurgical change.  She underwent genetic testing in [place] and is BRCA negative.  She wished to have completion surgery.  OPERATIVE REPORT  Date of Service: July 19, 2015  Findings:  1. Enlarged cystic  left ovary measuring roughly 5cm  2. No nodularity noted in the pelvis and abdomen  3. Liver edge with no evidence of disease  4. Treatment effect noted on sigmoid colon  5. Filmy adhesions noted  in pelvis between small bowel, left tube/ovary, and uterus  6. 3-4 cm umbilical hernia noted  7. Minimal adhesive disease noted anteriorly  8. Mirena IUD removed  A: Uterus, cervix, left fallopian tube and ovary, hysterectomy and left salpingo-oophorectomy  Cervix  - Squamous metaplasia and endocervical glandular dilatation  - No dysplasia or carcinoma identified  Endometrium  - Inactive endometrium with stromal decidualization, consistent with progestin effect  Myometrium  - Serosa involved by serous carcinoma  - Leiomyoma measuring 1.4 cm in greatest dimension  Left ovary  - Invasive high grade serous carcinoma with associated psammoma bodies in a background of serous borderline tumor, 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) (see comment)  - Largest invasive focus measures 0.6 cm (overall tumor is 3.9 cm)  - Surface of the ovary is involved  - Background follicular cysts, some hemorrhagic  Left fallopian tube  - No carcinoma identified  - Unremarkable fallopian tube with paratubal serous cysts  - Fallopian tube adherent to left ovary  She last saw Dr. Alycia Rossetti at [facility] on 10/21/15.  She received cycle 4 of carboplatin and taxol on 10/25/15.  Due to symptomatic anemia after, she received two units of PRBCs on 11/08/15.  She then received cycle 5 on 11/18/15.  Interval History:  She presents today with her mother for evaluation of fever, chills, and body aches. She called the office this am stating she is "not good at all."  "I did not go to the mountains last weekend because I felt like I was coming down with something.  I felt awful over the weekend with my muscles feeling like I had been kicked everywhere.  I had my parents come and pick me up last night because I could barely move."  Temp last pm was 100.2 with cough (slightly productive last pm) and sore throat.  Last chemo per pt was Oct 30.  She is supposed to come in for labs today but is concerned about exposing  patients.  She reports decreased urine output over the past several weeks before she noticed she was feeling ill.  Taking in PO liquids but decreased appetite.  She last took Tylenol at 11:15am and states she took some of her mother's prescription for cough medication last pm which helped ease her cough so she could sleep several hours.  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  She reports a headache when coughing.  Denies visual changes. No other concerns voiced.     Review of Systems  Constitutional: Feels poorly.  Weak, fatigued, decreased appetite.  Cardiovascular: No chest pain or edema. Positive for shortness of breath with minimal exertion.  Pulmonary: Coughing that interferes with sleep.  Productive last pm.  No wheeze.  Gastrointestinal: No nausea, vomiting, or diarrhea. No bright red blood per rectum or change in bowel movement.  Genitourinary: DecreNo frequency, urgency, or dysuria. No vaginal bleeding or discharge.  Musculoskeletal: Positive for myalgias all over including joint pain. Neurologic: No numbness or change in gait. Positive for weakness. Psychology: No depression, anxiety.  Positive for insomnia related to coughing  Current Meds:  No facility-administered encounter medications on file as of 11/28/2015.    Outpatient Encounter Prescriptions  as of 11/28/2015  Medication Sig  . venlafaxine XR (EFFEXOR-XR) 150 MG 24 hr capsule Take 150 mg by mouth daily with breakfast.   . [DISCONTINUED] clonazePAM (KLONOPIN) 1 MG tablet Take 1 mg by mouth.  Marland Kitchen albuterol (PROVENTIL HFA;VENTOLIN HFA) 108 (90 BASE) MCG/ACT inhaler Inhale 2 puffs into the lungs every 6 (six) hours as needed for wheezing or shortness of breath.  . amphetamine-dextroamphetamine (ADDERALL XR) 30 MG 24 hr capsule Take 30 mg by mouth daily with breakfast. Only takes when she is at school  . amphetamine-dextroamphetamine (ADDERALL) 20 MG tablet  Take 20 mg by mouth daily with lunch. Only takes when she is at school  . aspirin 81 MG chewable tablet Chew 81 mg by mouth daily with breakfast.   . atorvastatin (LIPITOR) 40 MG tablet Take 40 mg by mouth at bedtime.   . cetirizine (ZYRTEC) 10 MG tablet Take 10 mg by mouth daily with breakfast.   . clonazePAM (KLONOPIN) 1 MG tablet Take 1 mg by mouth 3 (three) times daily as needed for anxiety.   Marland Kitchen dexamethasone (DECADRON) 4 MG tablet Take 8 mg by mouth daily with breakfast. Pt only takes this medication on days 2,3, and 4 of chemo.  Marland Kitchen HYDROcodone-homatropine (HYCODAN) 5-1.5 MG/5ML syrup Take 5 mLs by mouth every 6 (six) hours as needed for cough. (Patient not taking: Reported on 11/28/2015)  . losartan (COZAAR) 50 MG tablet Take 100 mg by mouth daily with breakfast.   . magnesium oxide (MAGNESIUM-OXIDE) 400 (241.3 Mg) MG tablet Take 400 mg by mouth 2 (two) times daily.  . metoprolol succinate (TOPROL-XL) 100 MG 24 hr tablet Take 100 mg by mouth daily with breakfast. Take with or immediately following a meal.   . Multiple Vitamin (MULTIVITAMIN WITH MINERALS) TABS tablet Take 1 tablet by mouth daily with breakfast.   . nitroGLYCERIN (NITROSTAT) 0.4 MG SL tablet Place 0.4 mg under the tongue every 5 (five) minutes as needed for chest pain.  Marland Kitchen oxyCODONE (OXY IR/ROXICODONE) 5 MG immediate release tablet Take 5 mg by mouth every 6 (six) hours as needed for severe pain (Only takes during chemo).   . prasugrel (EFFIENT) 10 MG TABS tablet Take 10 mg by mouth daily with breakfast.   . promethazine (PHENERGAN) 25 MG tablet Take 1 tablet (25 mg total) by mouth every 6 (six) hours as needed for nausea or vomiting. (Patient not taking: Reported on 11/28/2015)  . Zinc Oxide (DESITIN) 40 % PSTE Apply 1 application topically 2 (two) times daily as needed (skin breakdown). (Patient not taking: Reported on 11/28/2015)  . [DISCONTINUED] ALPRAZolam (XANAX) 1 MG tablet TK 1 T PO TID PRN  . [DISCONTINUED] fluticasone  (FLONASE) 50 MCG/ACT nasal spray Place 2 sprays into both nostrils daily as needed for rhinitis.  . [DISCONTINUED] loperamide (IMODIUM) 2 MG capsule Take 4 mg by mouth as needed for diarrhea or loose stools.  . [DISCONTINUED] Potassium Chloride ER 20 MEQ TBCR TK 2 TS PO BID  . [DISCONTINUED] potassium chloride SA (K-DUR,KLOR-CON) 20 MEQ tablet Take 2 tablets (40 mEq total) by mouth 2 (two) times daily.  . [DISCONTINUED] venlafaxine (EFFEXOR) 37.5 MG tablet Take 37.5 mg by mouth daily.    Allergy:  Allergies  Allergen Reactions  . Lisinopril Cough    Social Hx:   Social History   Social History  . Marital status: Single    Spouse name: N/A  . Number of children: N/A  . Years of education: N/A   Occupational History  .  Not on file.   Social History Main Topics  . Smoking status: Former Smoker    Packs/day: 1.00    Years: 8.00    Types: Cigarettes    Quit date: 08/19/2011  . Smokeless tobacco: Never Used  . 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 Drug use: Unknown  . Sexual activity: Not on file   Other Topics Concern  . Not on file   Social History Narrative  . No narrative on file    Past Surgical Hx:  Past Surgical History:  Procedure Laterality Date  . ABDOMINAL HYSTERECTOMY    . BREAST REDUCTION SURGERY    . CORONARY ANGIOPLASTY WITH STENT PLACEMENT    . MOUTH SURGERY      Past Medical Hx:  Past Medical History:  Diagnosis Date  . Heart attack   . Ovarian cancer ([place]) 2016   right ovary    Family Hx:  Family History  Problem Relation Age of Onset  . Colon polyps Father     about 2-3 removed following colonoscopy  . Irritable bowel syndrome Brother     1 previous colonoscopy - no polyps found  . Breast cancer Paternal Aunt 38    "low level" breast cancer  . Prostate cancer Maternal Grandfather 49  . Lymphoma Maternal Grandfather 71    large B-cell lymphoma  . Heart Problems Paternal Grandmother   . Heart attack  Paternal Grandfather   . Cancer Other 76    unknown type    Vitals:  Blood pressure (!) 84/58, pulse 100, temperature (!) 100.5 F (38.1 C), resp. rate 18, last menstrual period 08/27/2014, SpO2 99 %.  Physical Exam:  General: Well developed, well nourished female appearing lethargic, pale. Alert and oriented x 3. Able to answer questions.  Short of breath at times. Head/Neck: Oropharynx mildly erythematous.  Neck supple but tender on palpation. Perspiration noted on forehead and head.  Lymph node survey: Mild cervical lymph node enlargement.  No supraclavicular, or inguinal adenopathy.  Cardiovascular: Tachycardic at 108 bpm. S1 and S2 normal.  Lungs: Clear to auscultation bilaterally. No wheezes/crackles/rhonchi noted.  Skin: No rashes or lesions present. Back: No CVA tenderness.  Abdomen: Abdomen soft, non-tender and obese. Active bowel sounds in all quadrants. No evidence of a fluid wave or abdominal masses.  Extremities: No bilateral cyanosis, edema, or clubbing.   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  Flu testing negative.  CBC and Cmet reviewed with the patient and her mother.  Blood cultures obtained.  Due to her slow decline while in the office, it is recommended she seek care in the Emergency Room to rule out sepsis.  Repeat BP was 84/58, HR 100 and she reports feeling worse.  More diaphoretic.  Spoke with Triage RN in the [facility] ED. Dr. Alycia Rossetti would also like patient to seek care in the ED.  Patient transported to the ED without difficulty and [facility] records given to the ED staff.  Will continue to follow.  Dr. Alycia Rossetti at [facility] aware of the situation.     CROSS, MELISSA DEAL, NP 11/29/2015, 4:30 PM

## 2015-11-28 NOTE — Progress Notes (Signed)
Patient listed as having [employer] insurance without a pcp.  EDCM spoke to patient at bedside.  Patient reports she has just established care with [facility] [place], but does not know the doctor's name.  She reports "Gay Filler something."  System updated.

## 2015-11-28 NOTE — Progress Notes (Addendum)
Pharmacy Antibiotic Note  Shelley Vega is a 35 y.o. female admitted on 11/28/2015 with fever, chills, body aches, dry cough, sore throat, and 8/10 headache.  PMH includes ovarian cancer, currently receiving chemo at [facility] (last on 10/30).  No noted acute process on CXR and CT head unremarkable.  Pt denies neurological complaints or neck rigidity.  Pharmacy has been consulted for Acyclovir dosing for possible viral meningitis. Addendum:   Pharmacy has been consulted for vancomycin and Cefepime dosing for febrile neutropenia as well.  SCr 0.7, CrCl > 100 ml/min CG/N Lactic acid: 1.79 Tm 100.5, WBC 1.8, ANC 0.9  Plan:  Acyclovir 660 mg IV q8h.  (10 mg/kg using adjusted weight for BMI > 30)  Follow up renal fxn, culture results, and clinical course.  Recommend de-escalating if viral meningitis is ruled out.   Cefepime 2g IV q8h Vancomycin 1g IV q8h. Measure Vanc trough at steady state.   Height: 5\' 3"  (160 cm) Weight: 190 lb (86.2 kg) IBW/kg (Calculated) : 52.4  BMI 33.6 Adjusted weight 66kg  Temp (24hrs), Avg:99 F (37.2 C), Min:98.2 F (36.8 C), Max:100.5 F (38.1 C)   Recent Labs Lab 11/28/15 1207 11/28/15 1606  WBC 1.8*  --   CREATININE 0.7  --   LATICACIDVEN  --  1.79    Estimated Creatinine Clearance: 102.1 mL/min (by C-G formula based on SCr of 0.7 mg/dL).    Allergies  Allergen Reactions  . Lisinopril Cough    Antimicrobials this admission: 11/9 Cefepime >> 11/9 Vancomycin >>  11/9 Acyclovir >>   Dose adjustments this admission:  Microbiology results: 11/9 Influenza A/B: negative 11/9 BCx:  11/9 UCx:    Thank you for allowing pharmacy to be a part of this patient's care.  Gretta Arab PharmD, BCPS Pager [phone] 11/28/2015 6:34 PM

## 2015-11-28 NOTE — H&P (Signed)
[facility] History and Physical  SAFFIRE PASKINS [id number] DOB: 05-03-1980 DOA: 11/28/2015  Referring physician: Dr. Ladell Pier ED PCP: PROVIDER NOT IN SYSTEM   Chief Complaint: chills, myalgias  HPI: Shelley Vega is a 35 y.o. female with history of CAD/ MI w cor stent x 2 and ovarian cancer getting active chemoRx.  Last round (Taxol/ [place]) was on 11/18/15, 10 days ago.  She presents with chills, myalgias, nausea for the last 24 hours, worsening today.  In ED BP was 70's , has improved to mid 90's after 3 L IV NS.  Usual BP at home is 110-120 syst.  No abd pain, no dysuria, or diarrhea.  Has had some URI symptoms with runny nose and dry cough.  No CP.  Aching legs/ arms/ body when the chills come.    His ovarian Ca stage 3a dx'd in Jun 2016.  Had surgical removal of L ovary and "very large " tumor per pts mother, then got 6 rounds of chemo w Taxol /carboplatin , then had recurrence in R ovary and underwent hysterectomy.  Post- chemo started another 6 rounds of same chemoRx and has completed 5 of 6.    Had MI in 2015 and had two stents placed.  No other sig surgery in the past.    All: lisinopril No tob, social etoh     ROS  denies CP  no joint pain   no HA  no blurry vision  no rash  no diarrhea  no nausea/ vomiting  no dysuria  no difficulty voiding  no change in urine color    Past Medical History  Past Medical History:  Diagnosis Date  . Heart attack   . Ovarian cancer ([place]) 2016   right ovary   Past Surgical History  Past Surgical History:  Procedure Laterality Date  . ABDOMINAL HYSTERECTOMY    . BREAST REDUCTION SURGERY    . CORONARY ANGIOPLASTY WITH STENT PLACEMENT    . MOUTH SURGERY     Family History  Family History  Problem Relation Age of Onset  . Colon polyps Father     about 2-3 removed following colonoscopy  . Irritable bowel syndrome Brother     1 previous colonoscopy - no polyps found  . Breast cancer Paternal Aunt 79    "low  level" breast cancer  . Prostate cancer Maternal Grandfather 83  . Lymphoma Maternal Grandfather 71    large B-cell lymphoma  . Heart Problems Paternal Grandmother   . Heart attack Paternal Grandfather   . Cancer Other 39    unknown type   Social History  reports that she quit smoking about 4 years ago. Her smoking use included Cigarettes. She has a 8.00 pack-year smoking history. She has never used smokeless tobacco. She reports that she drinks alcohol. Her drug history is not on file. Allergies  Allergies  Allergen Reactions  . Lisinopril Cough   Home medications Prior to Admission medications   Medication Sig Start Date End Date Taking? Authorizing Provider  acetaminophen (TYLENOL) 500 MG tablet Take 1,000 mg by mouth every 6 (six) hours as needed for mild pain, moderate pain, fever or headache.   Yes Historical Provider, MD  albuterol (PROVENTIL HFA;VENTOLIN HFA) 108 (90 BASE) MCG/ACT inhaler Inhale 2 puffs into the lungs every 6 (six) hours as needed for wheezing or shortness of breath.   Yes Historical Provider, MD  ALPRAZolam Duanne Moron) 1 MG tablet Take 1 mg by mouth 3 (three) times daily as  needed for anxiety.   Yes Historical Provider, MD  amphetamine-dextroamphetamine (ADDERALL XR) 30 MG 24 hr capsule Take 30 mg by mouth daily with breakfast. Only takes when she is at school   Yes Historical Provider, MD  amphetamine-dextroamphetamine (ADDERALL) 20 MG tablet Take 20 mg by mouth daily with lunch. Only takes when she is at school   Yes Historical Provider, MD  aspirin 81 MG chewable tablet Chew 81 mg by mouth daily with breakfast.    Yes Historical Provider, MD  atorvastatin (LIPITOR) 40 MG tablet Take 40 mg by mouth at bedtime.    Yes Historical Provider, MD  b complex vitamins tablet Take 1 tablet by mouth daily.   Yes Historical Provider, MD  cetirizine (ZYRTEC) 10 MG tablet Take 10 mg by mouth daily with breakfast.    Yes Historical Provider, MD  clonazePAM (KLONOPIN) 1 MG tablet  Take 1 mg by mouth 3 (three) times daily as needed for anxiety.    Yes Historical Provider, MD  dexamethasone (DECADRON) 4 MG tablet Take 8 mg by mouth daily with breakfast. Pt only takes this medication on days 2,3, and 4 of chemo.   Yes Historical Provider, MD  ibuprofen (ADVIL,MOTRIN) 200 MG tablet Take 400 mg by mouth every 6 (six) hours as needed for fever, headache, mild pain, moderate pain or cramping.   Yes Historical Provider, MD  losartan (COZAAR) 50 MG tablet Take 100 mg by mouth daily with breakfast.    Yes Historical Provider, MD  magnesium oxide (MAGNESIUM-OXIDE) 400 (241.3 Mg) MG tablet Take 400 mg by mouth 2 (two) times daily.   Yes Historical Provider, MD  metoprolol succinate (TOPROL-XL) 100 MG 24 hr tablet Take 100 mg by mouth daily with breakfast. Take with or immediately following a meal.    Yes Historical Provider, MD  Multiple Vitamin (MULTIVITAMIN WITH MINERALS) TABS tablet Take 1 tablet by mouth daily with breakfast.    Yes Historical Provider, MD  nitroGLYCERIN (NITROSTAT) 0.4 MG SL tablet Place 0.4 mg under the tongue every 5 (five) minutes as needed for chest pain.   Yes Historical Provider, MD  Omega-3 Fatty Acids (FISH OIL PO) Take 1 capsule by mouth daily.   Yes Historical Provider, MD  oxyCODONE (OXY IR/ROXICODONE) 5 MG immediate release tablet Take 5 mg by mouth every 6 (six) hours as needed for severe pain (Only takes during chemo).    Yes Historical Provider, MD  potassium chloride SA (K-DUR,KLOR-CON) 20 MEQ tablet Take 20 mEq by mouth daily with breakfast.   Yes Historical Provider, MD  prasugrel (EFFIENT) 10 MG TABS tablet Take 10 mg by mouth daily with breakfast.    Yes Historical Provider, MD  venlafaxine XR (EFFEXOR-XR) 150 MG 24 hr capsule Take 150 mg by mouth daily with breakfast.  10/21/15 10/20/16 Yes Historical Provider, MD  HYDROcodone-homatropine (HYCODAN) 5-1.5 MG/5ML syrup Take 5 mLs by mouth every 6 (six) hours as needed for cough. Patient not taking:  Reported on 11/28/2015 11/28/15   Dorothyann Gibbs, NP  promethazine (PHENERGAN) 25 MG tablet Take 1 tablet (25 mg total) by mouth every 6 (six) hours as needed for nausea or vomiting. Patient not taking: Reported on 11/28/2015 08/26/15   Leo Grosser, MD  Zinc Oxide (DESITIN) 40 % PSTE Apply 1 application topically 2 (two) times daily as needed (skin breakdown). Patient not taking: Reported on 11/28/2015 08/26/15   Leo Grosser, MD   Liver Function Tests  Recent Labs Lab 11/28/15 1207  AST 20  ALT  26  ALKPHOS 93  BILITOT 0.68  PROT 7.4  ALBUMIN 3.5   No results for input(s): LIPASE, AMYLASE in the last 168 hours. CBC  Recent Labs Lab 11/28/15 1207  WBC 1.8*  NEUTROABS 0.9*  HGB 8.8*  HCT 26.0*  MCV 97.7  PLT 65*   Basic Metabolic Panel  Recent Labs Lab 11/28/15 1207  NA 133*  K 3.5  CO2 19*  GLUCOSE 141*  BUN 12.1  CREATININE 0.7  CALCIUM 8.9     Vitals:   11/28/15 1730 11/28/15 1746 11/28/15 1831 11/28/15 1906  BP: (!) 90/49 (!) 90/49 92/60 99/64   Pulse: 86 85 85 93  Resp:  20 19 18   Temp:   98.9 F (37.2 C)   TempSrc:   Oral   SpO2: 100% 95% 100% 100%  Weight:      Height:       Exam: Gen shivering under blankets, alert and responsive, tired and chron-ill appearing No rash, cyanosis or gangrene Sclera anicteric, throat clear no exudates/ erythema  No jvd or bruits, no meningismus Chest clear bilat no rales/ rhonchi R upper chest SQ port, no surrounding drainage / erythema/ fluctuance RRR no MRG Abd soft ntnd no mass or ascites +bs mottled skin, well-healed midline scar GU defer MS no joint effusions or deformity Ext no leg edema / no wounds or ulcers Neuro is alert, Ox 3 , nf   UA tntc WBC WBC 1.8  TNC 0.9  Hb 8.8  plt 65    Na 133 K 3.5  CO2 19   Lact acid 1.79   BUN 12  Cr 0.7     CXR (independ reviewed) > normal CXR, mild bronchitic changes   Assessment: 1. Neutropenic fever / possible sepsis / hypotension / chills/ fever- lactic acid and  RR ok.  MAP > 65 now after IVF's in ED.  Possible urinary source (+UA pyuria) vs port infection vs other. Abd benign and CXR neg. Plan admit, IVF's, SDU and broad spec abx.  Cx's already sent. Have notified CCM about patient as she looks sick now and has potential to worsen.  They will follow her status and consult if she declines.  Blood cultures were drawn at the [facility] before pt came to ED.  Urine cx sent here.   2. Ovarian cancer - on chemoRx (Taxol/ carbo) 3. Pancytopenia - suspect due to chemo 4. Hx MI - on Effient, ASA, BB, statin at home; hold meds 5. HTN - on losartan/ MTP, holding  Plan - as above     [place] D [facility] Pager [phone]   If 7PM-7AM, please contact night-coverage [email or web address] Password [email or web address] 11/28/2015, 7:09 PM

## 2015-11-28 NOTE — ED Notes (Signed)
Patient transported to X-ray 

## 2015-11-28 NOTE — ED Notes (Signed)
BC x2 and flu swab (negative), CBC/CMET done at [facility].  Last chemo at [facility] 10/30.

## 2015-11-29 DIAGNOSIS — I2510 Atherosclerotic heart disease of native coronary artery without angina pectoris: Secondary | ICD-10-CM

## 2015-11-29 DIAGNOSIS — R6520 Severe sepsis without septic shock: Secondary | ICD-10-CM

## 2015-11-29 DIAGNOSIS — C569 Malignant neoplasm of unspecified ovary: Secondary | ICD-10-CM

## 2015-11-29 DIAGNOSIS — R5081 Fever presenting with conditions classified elsewhere: Secondary | ICD-10-CM

## 2015-11-29 DIAGNOSIS — A419 Sepsis, unspecified organism: Secondary | ICD-10-CM

## 2015-11-29 DIAGNOSIS — D709 Neutropenia, unspecified: Secondary | ICD-10-CM

## 2015-11-29 DIAGNOSIS — N390 Urinary tract infection, site not specified: Secondary | ICD-10-CM

## 2015-11-29 LAB — COMPREHENSIVE METABOLIC PANEL
ALBUMIN: 3.1 g/dL — AB (ref 3.5–5.0)
ALT: 38 U/L (ref 14–54)
ANION GAP: 7 (ref 5–15)
AST: 43 U/L — ABNORMAL HIGH (ref 15–41)
Alkaline Phosphatase: 60 U/L (ref 38–126)
BILIRUBIN TOTAL: 0.6 mg/dL (ref 0.3–1.2)
BUN: 10 mg/dL (ref 6–20)
CHLORIDE: 105 mmol/L (ref 101–111)
CO2: 19 mmol/L — ABNORMAL LOW (ref 22–32)
Calcium: 6.9 mg/dL — ABNORMAL LOW (ref 8.9–10.3)
Creatinine, Ser: 0.75 mg/dL (ref 0.44–1.00)
GFR calc Af Amer: >60 mL/min (ref >60)
GFR calc non Af Amer: >60 mL/min (ref >60)
GLUCOSE: 120 mg/dL — AB (ref 65–99)
POTASSIUM: 3.2 mmol/L — AB (ref 3.5–5.1)
SODIUM: 131 mmol/L — AB (ref 135–145)
TOTAL PROTEIN: 6.2 g/dL — AB (ref 6.5–8.1)

## 2015-11-29 LAB — CBC WITH DIFFERENTIAL/PLATELET
BASOS ABS: 0 10*3/uL (ref 0.0–0.1)
Basophils Relative: 1 %
Eosinophils Absolute: 0 10*3/uL (ref 0.0–0.7)
Eosinophils Relative: 2 %
HCT: 22.1 % — ABNORMAL LOW (ref 36.0–46.0)
HEMOGLOBIN: 7.6 g/dL — AB (ref 12.0–15.0)
LYMPHS PCT: 36 %
Lymphs Abs: 0.5 10*3/uL — ABNORMAL LOW (ref 0.7–4.0)
MCH: 33.5 pg (ref 26.0–34.0)
MCHC: 34.4 g/dL (ref 30.0–36.0)
MCV: 97.4 fL (ref 78.0–100.0)
MONOS PCT: 31 %
Monocytes Absolute: 0.5 10*3/uL (ref 0.1–1.0)
NEUTROS ABS: 0.5 10*3/uL — AB (ref 1.7–7.7)
Neutrophils Relative %: 30 %
Platelets: 57 10*3/uL — ABNORMAL LOW (ref 150–400)
RBC: 2.27 MIL/uL — AB (ref 3.87–5.11)
RDW: 21.3 % — ABNORMAL HIGH (ref 11.5–15.5)
WBC: 1.5 10*3/uL — AB (ref 4.0–10.5)

## 2015-11-29 LAB — PROTIME-INR
INR: 1.26
Prothrombin Time: 15.9 seconds — ABNORMAL HIGH (ref 11.4–15.2)

## 2015-11-29 LAB — CBC
HEMATOCRIT: 21.5 % — AB (ref 36.0–46.0)
HEMOGLOBIN: 7.4 g/dL — AB (ref 12.0–15.0)
MCH: 33.2 pg (ref 26.0–34.0)
MCHC: 34.4 g/dL (ref 30.0–36.0)
MCV: 96.4 fL (ref 78.0–100.0)
Platelets: 59 10*3/uL — ABNORMAL LOW (ref 150–400)
RBC: 2.23 MIL/uL — ABNORMAL LOW (ref 3.87–5.11)
RDW: 21.7 % — ABNORMAL HIGH (ref 11.5–15.5)
WBC: 1.5 10*3/uL — ABNORMAL LOW (ref 4.0–10.5)

## 2015-11-29 MED ORDER — B COMPLEX-C PO TABS
1.0000 | ORAL_TABLET | Freq: Every day | ORAL | Status: DC
  Administered 2015-11-29 – 2015-12-02 (×4): 1 via ORAL
  Filled 2015-11-29 (×4): qty 1

## 2015-11-29 MED ORDER — ASPIRIN 81 MG PO CHEW
81.0000 mg | CHEWABLE_TABLET | Freq: Every day | ORAL | Status: DC
  Administered 2015-11-30 – 2015-12-02 (×3): 81 mg via ORAL
  Filled 2015-11-29 (×3): qty 1

## 2015-11-29 MED ORDER — POTASSIUM CHLORIDE CRYS ER 20 MEQ PO TBCR
40.0000 meq | EXTENDED_RELEASE_TABLET | Freq: Two times a day (BID) | ORAL | Status: AC
Start: 2015-11-29 — End: 2015-11-29
  Administered 2015-11-29 (×2): 40 meq via ORAL
  Filled 2015-11-29 (×2): qty 2

## 2015-11-29 MED ORDER — ALPRAZOLAM 1 MG PO TABS
1.0000 mg | ORAL_TABLET | Freq: Three times a day (TID) | ORAL | Status: DC | PRN
  Administered 2015-12-01: 1 mg via ORAL
  Filled 2015-11-29: qty 1

## 2015-11-29 MED ORDER — ATORVASTATIN CALCIUM 40 MG PO TABS
40.0000 mg | ORAL_TABLET | Freq: Every day | ORAL | Status: DC
  Administered 2015-11-29 – 2015-12-01 (×3): 40 mg via ORAL
  Filled 2015-11-29 (×3): qty 1

## 2015-11-29 MED ORDER — PRASUGREL HCL 10 MG PO TABS
10.0000 mg | ORAL_TABLET | Freq: Every day | ORAL | Status: DC
Start: 2015-11-30 — End: 2015-12-02
  Administered 2015-11-30 – 2015-12-02 (×3): 10 mg via ORAL
  Filled 2015-11-29 (×3): qty 1

## 2015-11-29 MED ORDER — SODIUM CHLORIDE 0.9 % IV SOLN
INTRAVENOUS | Status: DC
  Administered 2015-11-29 – 2015-12-01 (×5): via INTRAVENOUS

## 2015-11-29 NOTE — Progress Notes (Signed)
Patient seen today.  Father at the bedside as well.  States she is feeling better this am but feels she has a fever at this time.  States she had a high fever last pm as well and they brought ice in.  Patient is an active chemo patient at [facility], last chemo on 11/18/15.  Dr. Alycia Rossetti is her GYN Oncologist at [facility].  Her pager number is [phone].  After hours call [phone] to be redirected to the MD on call.  Blood cultures pending.  Continue plan of care per the Hospitalist Team.  Appreciate their management.

## 2015-11-29 NOTE — Care Management Note (Signed)
Case Management Note  Patient Details  Name: Shelley Vega MRN: [id number] Date of Birth: 07-24-1980  Subjective/Objective:     Sepsis and hx of Ca wbc 1.3               Action/Plan:  From home Date:  November 29, 2015 Chart reviewed for concurrent status and case management needs. Will continue to follow patient progress: Discharge Planning: following for needs Expected discharge date: [id number] Velva Harman, BSN, [place], [place]   Expected Discharge Date:   (unknown)               Expected Discharge Plan:  Home/Self Care  In-House Referral:     Discharge planning Services     Post Acute Care Choice:    Choice offered to:     DME Arranged:    DME Agency:     HH Arranged:    [place] Agency:     Status of Service:  In process, will continue to follow  If discussed at Long Length of Stay Meetings, dates discussed:    Additional Comments:  Leeroy Cha, RN 11/29/2015, 9:54 AM

## 2015-11-29 NOTE — Progress Notes (Signed)
PROGRESS NOTE    Shelley Vega  [id number] DOB: 1980/09/30 DOA: 11/28/2015 PCP: PROVIDER NOT IN SYSTEM   Brief Narrative:  Shelley Vega is a 35 y.o. female with history of CAD/ MI w cor stent x 2 and ovarian cancer getting active chemoRx.  Last round (Taxol/ [place]) was on 11/18/15, 10 days ago.  She presents with chills, myalgias, nausea for the last 24 hours, worsening today.  In ED BP was 70's , has improved to mid 90's after 3 L IV NS.  Usual BP at home is 110-120 syst.  No abd pain, no dysuria, or diarrhea.  Has had some URI symptoms with runny nose and dry cough.  No CP.  Aching legs/ arms/ body when the chills come. She was admitted to SDU/ICU and is slowly improving and no longer septic.   Assessment & Plan:   Active Problems:   CAD (coronary artery disease) - sp cor stent x 2   Ovarian cancer (HCC)   Severe sepsis (HCC)   Hypotension   Neutropenia with fever (HCC)   Pyuria   Pancytopenia ([place])- due to chemoRx   Neutropenic fever (HCC)  Neutropenic Fever with Unclear Etiology for source presumed to be from possible Viral Upper Respiratory Source -Neutropenic Precautions; ANC was 900 yesterday and today it was 500 -C/w IVF Rehydration -C/w IV Acycolvir q8h, Cefepime IV q8h, and with Vancomycin IV q8h -Patient spiked a Temperature of 103.1 last night and 101 today -Flu Negative -Blood Cx x 2 were cancelled in the hospital as they were drawn at [facility]; Reordered Blood Cultures here -Urine Culture pending; UA was Turbid and showed Many Bacteria, Large Leukocytes, Negative Nitrites, and TNTC WBC however did show 6-30 Squamous Epithelial Cells -Will obtain Sputum Cx -Discussed Case with Dr. Denman George of Gyn-Onc and I personally also spoke with Dr. Alycia Rossetti (patients Primary Heme/Onc) that patient is in the hospital. (PAGER is [phone]) -Repeat CXR in AM as initial CXR showed mild central bronchitic changes  -May need Filgastrim and will monitor ANC and was recommended  300 sq of Neupogen by Dr. Alycia Rossetti if patient doesn't improve.   Severe Sepsis 2/2 to Unclear Etiology likely URI vs. Other ? Urinary Source -Sepsis Physiology Resolving as patient no longer Hypotensive -C/w IV Abx and IV Antivirals -Pan-Culture  -IV Fluids with NS at 100 mL/hr -Notified PCCM and will officially Consult if patient worsens or decompensates -C/w Alubetrol Nebs q6h  Ovarian Cancer on Current Chemotherapy with Taxol/Carboplatin -Last Chemotherapy Tx was 11/19/2015; Patient take Decadron with Chemotherapy -C/w Pain Control with Hydrocodone-Homatropine 5 mL po q6h for Cough, Oxycodone IR 5 mg po q4hprn, Tramadol 50 mg po q6hprn for Moderate Pain, and with Morphine 2 mg IV q4hprn  Pancytopenia -Likely 2/2 to Chemotherapy -Continue to Monitor and repeat CBC with Differential in AM  Hypokalemia -Patient's K+ was 3.2 -Repleted with po Kcl 40 mEQ x2 -Repeat BMP in AM  Hyponatremia -Patient's Na+ was 131 -C/w IVF Hydration and Repeat BMP in AM  CAD s/p 2 stents and MI -Restarted ASA 81 mg, Prasugrel 10 mg tablets, and Statin  -Will slowly restart Metoprolol and Losartan pending Blood Pressure  Hypertension -Holding BP meds of Metoprolol and Losartan because of Hypotension on Admission  ADD Held patient's Home Adderall   DVT prophylaxis: SCD's Code Status: Full Family Communication: Discussed with Family at Bedside Disposition Plan: Pending Clinical Course. Likely Home  Consultants:   None- Spoke to Dr. Alycia Rossetti patient's Primary Hematologist/Oncologist at [facility] and updated her  on the Status of the Patient  Procedures: None  Antimicrobials: IV Cefepime, IV Vancomycin, and IV Acyclovir  Subjective: Seen and examined at bedside and improving. States she feels better than yesterday. No CP/SOB. Had some Nausea yesterday. No burning or discomfort in her urine. No other complaints or concerns at this time.   Objective: Vitals:   11/29/15 0800 11/29/15 0900 11/29/15 1000  11/29/15 1100  BP: 136/70  103/61   Pulse: (!) 115 (!) 133 (!) 111 (!) 118  Resp: (!) 25  20 18   Temp:   (!) 101 F (38.3 C)   TempSrc:   Oral   SpO2: 99% 97% 98% 100%  Weight:      Height:        Intake/Output Summary (Last 24 hours) at 11/29/15 1224 Last data filed at 11/29/15 1014  Gross per 24 hour  Intake          4889.65 ml  Output             2200 ml  Net          2689.65 ml   Filed Weights   11/28/15 1624 11/29/15 0500  Weight: 86.2 kg (190 lb) 98.9 kg (218 lb 0.6 oz)    Examination: Physical Exam:  Constitutional: WN/WD, Mild Distress Eyes: Lids and conjunctivae normal, sclerae anicteric  ENMT: External Ears, Nose appear normal. Grossly normal hearing. Mucous membranes appeared dry. Forehead felt warm to the touch. Neck: Appears normal, supple, no cervical masses, normal ROM, no appreciable thyromegaly Respiratory: Diminished but Clear to auscultation bilaterally, no wheezing, rales, rhonchi or crackles. Normal respiratory effort and patient is not tachypenic. No accessory muscle use.  Chest Wall: Chemo-Port in Place Cardiovascular: Tachycardic but regular rate, no appreciable murmurs / rubs / gallops. S1 and S2 auscultated. No extremity edema. 2+ pedal pulses.  Abdomen: Soft, non-tender, non-distended. No masses palpated. No appreciable hepatosplenomegaly. Bowel sounds positive x4 GU: Deferred. Musculoskeletal: No clubbing / cyanosis of digits/nails. No joint deformity upper and lower extremities. Normal strength and muscle tone.  Skin: No rashes, lesions, ulcers. No induration on limited skin exam; Warm and dry.  Neurologic: CN 2-12 grossly intact with no focal deficits. Sensation intact in all 4 Extremities. Romberg sign cerebellar reflexes not assessed.  Psychiatric: Normal judgment and insight. Alert and oriented x 3. Normal mood and appropriate affect.   Data Reviewed: I have personally reviewed following labs and imaging studies  CBC:  Recent Labs Lab  11/28/15 1207 11/29/15 0345  WBC 1.8* 1.5*  NEUTROABS 0.9*  --   HGB 8.8* 7.4*  HCT 26.0* 21.5*  MCV 97.7 96.4  PLT 65* 59*   Basic Metabolic Panel:  Recent Labs Lab 11/28/15 1207 11/29/15 0345  NA 133* 131*  K 3.5 3.2*  CL  --  105  CO2 19* 19*  GLUCOSE 141* 120*  BUN 12.1 10  CREATININE 0.7 0.75  CALCIUM 8.9 6.9*   GFR: Estimated Creatinine Clearance: 110 mL/min (by C-G formula based on SCr of 0.75 mg/dL). Liver Function Tests:  Recent Labs Lab 11/28/15 1207 11/29/15 0345  AST 20 43*  ALT 26 38  ALKPHOS 93 60  BILITOT 0.68 0.6  PROT 7.4 6.2*  ALBUMIN 3.5 3.1*   No results for input(s): LIPASE, AMYLASE in the last 168 hours. No results for input(s): AMMONIA in the last 168 hours. Coagulation Profile:  Recent Labs Lab 11/29/15 0345  INR 1.26   Cardiac Enzymes: No results for input(s): CKTOTAL, CKMB, CKMBINDEX, TROPONINI in  the last 168 hours. BNP (last 3 results) No results for input(s): PROBNP in the last 8760 hours. HbA1C: No results for input(s): HGBA1C in the last 72 hours. CBG: No results for input(s): GLUCAP in the last 168 hours. Lipid Profile: No results for input(s): CHOL, HDL, LDLCALC, TRIG, CHOLHDL, LDLDIRECT in the last 72 hours. Thyroid Function Tests: No results for input(s): TSH, T4TOTAL, FREET4, T3FREE, THYROIDAB in the last 72 hours. Anemia Panel: No results for input(s): VITAMINB12, FOLATE, FERRITIN, TIBC, IRON, RETICCTPCT in the last 72 hours. Sepsis Labs:  Recent Labs Lab 11/28/15 1606  LATICACIDVEN 1.79    Recent Results (from the past 240 hour(s))  MRSA PCR Screening     Status: None   Collection Time: 11/28/15  8:00 PM  Result Value Ref Range Status   MRSA by PCR NEGATIVE NEGATIVE Final    Comment:        The GeneXpert MRSA Assay (FDA approved for NASAL specimens only), is one component of a comprehensive MRSA colonization surveillance program. It is not intended to diagnose MRSA infection nor to guide  or monitor treatment for MRSA infections.     Radiology Studies: Dg Chest 2 View  Result Date: 11/28/2015 CLINICAL DATA:  Fever, sore throat, cough and chills. Myalgias. History of ovarian cancer. EXAM: CHEST  2 VIEW COMPARISON:  None. FINDINGS: Right IJ port catheter tip mid SVC level. Normal heart size and vascularity. No focal pneumonia, collapse or consolidation. Negative for edema, effusion or pneumothorax. Minor central bronchitic change. Trachea is midline. No significant osseous finding. Coronary stents present. IMPRESSION: Mild central bronchitic change.  No superimposed acute process. Electronically Signed   By: Jerilynn Mages.  Shick M.D.   On: 11/28/2015 16:40   Ct Head Wo Contrast  Result Date: 11/28/2015 CLINICAL DATA:  35 y/o F; dizziness with severe headache. Patient undergoing chemotherapy for ovarian cancer. EXAM: CT HEAD WITHOUT CONTRAST TECHNIQUE: Contiguous axial images were obtained from the base of the skull through the vertex without intravenous contrast. COMPARISON:  None available. FINDINGS: Brain: No evidence of acute infarction, hemorrhage, hydrocephalus, extra-axial collection or mass lesion/mass effect. Vascular: No hyperdense vessel or unexpected calcification. Skull: Normal. Negative for fracture or focal lesion. Sinuses/Orbits: No acute finding. Other: None. IMPRESSION: No acute intracranial abnormality identified. Unremarkable CT of the head for age. Electronically Signed   By: Kristine Garbe M.D.   On: 11/28/2015 17:05   Scheduled Meds: . acyclovir  660 mg Intravenous Q8H  . ceFEPime (MAXIPIME) IV  2 g Intravenous Q8H  . vancomycin  1,000 mg Intravenous Q8H  . venlafaxine XR  150 mg Oral Q breakfast   Continuous Infusions:   LOS: 1 day   Kerney Elbe, DO [facility] Pager [phone]  If 7PM-7AM, please contact night-coverage [email or web address] Password [email or web address] 11/29/2015, 12:24 PM

## 2015-11-30 ENCOUNTER — Other Ambulatory Visit: Payer: Self-pay | Admitting: Hematology and Oncology

## 2015-11-30 ENCOUNTER — Inpatient Hospital Stay (HOSPITAL_COMMUNITY): Payer: BC Managed Care – PPO

## 2015-11-30 DIAGNOSIS — D61810 Antineoplastic chemotherapy induced pancytopenia: Secondary | ICD-10-CM

## 2015-11-30 DIAGNOSIS — E441 Mild protein-calorie malnutrition: Secondary | ICD-10-CM

## 2015-11-30 DIAGNOSIS — E876 Hypokalemia: Secondary | ICD-10-CM

## 2015-11-30 DIAGNOSIS — T451X5A Adverse effect of antineoplastic and immunosuppressive drugs, initial encounter: Secondary | ICD-10-CM

## 2015-11-30 DIAGNOSIS — D708 Other neutropenia: Secondary | ICD-10-CM

## 2015-11-30 DIAGNOSIS — D696 Thrombocytopenia, unspecified: Secondary | ICD-10-CM

## 2015-11-30 DIAGNOSIS — A4189 Other specified sepsis: Secondary | ICD-10-CM

## 2015-11-30 DIAGNOSIS — G620 Drug-induced polyneuropathy: Secondary | ICD-10-CM

## 2015-11-30 LAB — ABO/RH: ABO/RH(D): A POS

## 2015-11-30 LAB — COMPREHENSIVE METABOLIC PANEL
ALBUMIN: 3.1 g/dL — AB (ref 3.5–5.0)
ALK PHOS: 60 U/L (ref 38–126)
ALT: 31 U/L (ref 14–54)
ANION GAP: 6 (ref 5–15)
AST: 24 U/L (ref 15–41)
BUN: 7 mg/dL (ref 6–20)
CALCIUM: 7.5 mg/dL — AB (ref 8.9–10.3)
CHLORIDE: 103 mmol/L (ref 101–111)
CO2: 25 mmol/L (ref 22–32)
Creatinine, Ser: 0.53 mg/dL (ref 0.44–1.00)
GFR calc Af Amer: >60 mL/min (ref >60)
GFR calc non Af Amer: >60 mL/min (ref >60)
GLUCOSE: 153 mg/dL — AB (ref 65–99)
Potassium: 2.8 mmol/L — ABNORMAL LOW (ref 3.5–5.1)
SODIUM: 134 mmol/L — AB (ref 135–145)
Total Bilirubin: 0.4 mg/dL (ref 0.3–1.2)
Total Protein: 6.2 g/dL — ABNORMAL LOW (ref 6.5–8.1)

## 2015-11-30 LAB — CBC
HCT: 22.4 % — ABNORMAL LOW (ref 36.0–46.0)
Hemoglobin: 7.8 g/dL — ABNORMAL LOW (ref 12.0–15.0)
MCH: 33.2 pg (ref 26.0–34.0)
MCHC: 34.8 g/dL (ref 30.0–36.0)
MCV: 95.3 fL (ref 78.0–100.0)
PLATELETS: 73 10*3/uL — AB (ref 150–400)
RBC: 2.35 MIL/uL — ABNORMAL LOW (ref 3.87–5.11)
RDW: 20.7 % — AB (ref 11.5–15.5)
WBC: 1.4 10*3/uL — CL (ref 4.0–10.5)

## 2015-11-30 LAB — CBC WITH DIFFERENTIAL/PLATELET
BASOS ABS: 0 10*3/uL (ref 0.0–0.1)
Basophils Relative: 1 %
EOS ABS: 0 10*3/uL (ref 0.0–0.7)
Eosinophils Relative: 1 %
HCT: 19.8 % — ABNORMAL LOW (ref 36.0–46.0)
HEMOGLOBIN: 6.8 g/dL — AB (ref 12.0–15.0)
LYMPHS ABS: 0.6 10*3/uL — AB (ref 0.7–4.0)
LYMPHS PCT: 40 %
MCH: 33.2 pg (ref 26.0–34.0)
MCHC: 34.3 g/dL (ref 30.0–36.0)
MCV: 96.6 fL (ref 78.0–100.0)
MONOS PCT: 35 %
Monocytes Absolute: 0.5 10*3/uL (ref 0.1–1.0)
Neutro Abs: 0.3 10*3/uL — ABNORMAL LOW (ref 1.7–7.7)
Neutrophils Relative %: 23 %
PLATELETS: 66 10*3/uL — AB (ref 150–400)
RBC: 2.05 MIL/uL — AB (ref 3.87–5.11)
RDW: 21.2 % — ABNORMAL HIGH (ref 11.5–15.5)
WBC: 1.4 10*3/uL — AB (ref 4.0–10.5)

## 2015-11-30 LAB — PREPARE RBC (CROSSMATCH)

## 2015-11-30 LAB — MAGNESIUM: Magnesium: 1.2 mg/dL — ABNORMAL LOW (ref 1.7–2.4)

## 2015-11-30 LAB — PHOSPHORUS: PHOSPHORUS: 2.6 mg/dL (ref 2.5–4.6)

## 2015-11-30 MED ORDER — PREGABALIN 25 MG PO CAPS
25.0000 mg | ORAL_CAPSULE | Freq: Two times a day (BID) | ORAL | Status: DC
  Administered 2015-11-30 – 2015-12-02 (×4): 25 mg via ORAL
  Filled 2015-11-30 (×4): qty 1

## 2015-11-30 MED ORDER — SODIUM CHLORIDE 0.9 % IV SOLN: Freq: Once | INTRAVENOUS | Status: DC

## 2015-11-30 MED ORDER — SODIUM CHLORIDE 0.9 % IV SOLN
Freq: Once | INTRAVENOUS | Status: AC
  Administered 2015-11-30: 22:00:00 via INTRAVENOUS

## 2015-11-30 MED ORDER — POTASSIUM CHLORIDE CRYS ER 20 MEQ PO TBCR
40.0000 meq | EXTENDED_RELEASE_TABLET | Freq: Once | ORAL | Status: AC
  Administered 2015-11-30: 40 meq via ORAL
  Filled 2015-11-30: qty 2

## 2015-11-30 MED ORDER — METOPROLOL SUCCINATE ER 100 MG PO TB24
100.0000 mg | ORAL_TABLET | Freq: Every day | ORAL | Status: DC
Start: 2015-12-01 — End: 2015-12-02
  Administered 2015-11-30 – 2015-12-01 (×2): 100 mg via ORAL
  Filled 2015-11-30: qty 1
  Filled 2015-11-30: qty 4

## 2015-11-30 MED ORDER — SODIUM CHLORIDE 0.9 % IV SOLN
30.0000 meq | Freq: Once | INTRAVENOUS | Status: AC
  Administered 2015-11-30: 30 meq via INTRAVENOUS
  Filled 2015-11-30: qty 15

## 2015-11-30 MED ORDER — TBO-FILGRASTIM 480 MCG/0.8ML ~~LOC~~ SOSY
480.0000 ug | PREFILLED_SYRINGE | Freq: Every day | SUBCUTANEOUS | Status: DC
  Administered 2015-11-30: 480 ug via SUBCUTANEOUS
  Filled 2015-11-30 (×3): qty 0.8

## 2015-11-30 NOTE — Progress Notes (Signed)
PROGRESS NOTE    Shelley Vega  [id number] DOB: 11/06/1980 DOA: 11/28/2015 PCP: PROVIDER NOT IN SYSTEM   Brief Narrative:  Shelley Vega is a 35 y.o. female with history of CAD/ MI w cor stent x 2 and ovarian cancer getting active chemoRx.  Last round (Taxol/ [place]) was on 11/18/15, 10 days ago.  She presents with chills, myalgias, nausea for the last 24 hours, worsening today.  In ED BP was 70's , has improved to mid 90's after 3 L IV NS.  Usual BP at home is 110-120 syst.  No abd pain, no dysuria, or diarrhea.  Has had some URI symptoms with runny nose and dry cough.  No CP.  Aching legs/ arms/ body when the chills come. She was admitted to SDU/ICU and is slowly improving and no longer septic however remains intermittently febrile with a WBC of 1.4 and an ANC of 300 today.   Assessment & Plan:   Active Problems:   CAD (coronary artery disease) - sp cor stent x 2   Ovarian cancer (HCC)   Severe sepsis (HCC)   Hypotension   Neutropenia with fever (HCC)   Pyuria   Pancytopenia ([place])- due to chemoRx   Neutropenic fever (HCC)  Neutropenic Fever with Unclear Etiology for source presumed to be from possible Viral Upper Respiratory Source vs. Urinary Souce -Neutropenic Precautions; ANC is going from 900 -> 500 -> 300 -IVF Rehydration at 100 mL/hr -D/C'd IV Acycolvir q8h; C/w Cefepime IV q8h, and with Vancomycin IV q8h -Patient spiked a Temperature of 103.1 last night and 101 today -Flu Negative -Blood Cx x 2 were cancelled in the hospital as they were drawn at [facility] (showed NGTD); Reordered Blood Cultures here which showed No Growth < 24 hours.  -Urine Culture showed >100,000 CFU of E. Coli; UA was Turbid and showed Many Bacteria, Large Leukocytes, Negative Nitrites, and TNTC WBC however did show 6-30 Squamous Epithelial Cells: **SENSITIVITIES PENDING** -Will obtain Sputum Cx -Concern of having bouts of Diarrhea last night but none this AM. -Discussed Case with Dr. Denman George of  Gyn-Onc and I personally also spoke with Dr. Alycia Rossetti (patients Primary Heme/Onc) that patient is in the hospital. (PAGER is [phone]) -Repeat CXR this AM showed no active disease as initial CXR showed mild central bronchitic changes  -May need Filgastrim and will monitor ANC and was recommended 300 sq of Neupogen by Dr. Alycia Rossetti if patient doesn't improve.  -Consulted Dr. Heath Lark in Hematology/Oncology for further Recc's and Management.   Severe Sepsis 2/2 to Unclear Etiology likely URI vs. Other ? Urinary Source -Sepsis Physiology Resolving as patient no longer Hypotensive -C/w IV Abx. D/C'd IV Antivirals -Pan-Culture  -IV Fluids with NS at 100 mL/hr -Notified PCCM and will officially Consult if patient worsens or decompensates -C/w Alubetrol Nebs q6h  Ovarian Cancer on Current Chemotherapy with Taxol/Carboplatin -Last Chemotherapy Tx was 11/19/2015; Patient take Decadron with Chemotherapy -C/w Pain Control with Hydrocodone-Homatropine 5 mL po q6h for Cough, Oxycodone IR 5 mg po q4hprn, Tramadol 50 mg po q6hprn for Moderate Pain, and with Morphine 2 mg IV q4hprn -Consulted Dr. Heath Lark for further evaluation and reccomendations  Pancytopenia -Likely 2/2 to Chemotherapy -Continue to Monitor and repeat CBC with Differential in AM -Hematology/Oncology Dr. Alvy Bimler consulted for additional recc's  Anemia likely from Chemotherapy -Patient's Hb/Hct went from 7.6/22.1 -> 6.8/19.8 -Transfused 1 unit of Irradiated pRBC -Repeat CBC  Hypokalemia -Patient's K+ was 2.8 this AM -Repleted with IV 30 mEQ and with  po Kcl 40 mEQ x2 -Repeat BMP in AM  Hyponatremia, improving -Patient's Na+ was 131 and now is 134 -C/w IVF Hydration and Repeat BMP in AM  CAD s/p 2 stents and MI -Restarted ASA 81 mg, Prasugrel 10 mg tablets, and Statin  -Will slowly restart Metoprolol and Losartan pending Blood Pressure  Hypertension -Holding BP meds of Metoprolol and Losartan because of Hypotension on  Admission  ADD Held patient's Home Adderall   DVT prophylaxis: SCD's Code Status: Full Family Communication: Discussed with Family at Bedside Disposition Plan: Pending Clinical Course. Likely Home  Consultants:   None- Spoke to Dr. Alycia Rossetti patient's Primary Hematologist/Oncologist at [facility] and updated her on the Status of the Patient  Hematology/Oncology Dr. Heath Lark   Procedures: None  Antimicrobials: IV Cefepime, IV Vancomycin; IV Acyclovir D/C'd  Subjective: Seen and examined at bedside and improving with having tylenol. States she is feeling ok. No urinary burning or discomfort however does have a non-productive cough. No CP/SOB or N/V. No Diarrhea or loose stools per patient. No other concerns or complaints at this time.   Objective: Vitals:   11/30/15 1215 11/30/15 1230 11/30/15 1300 11/30/15 1438  BP:  104/67  119/76  Pulse: 99 92 94 91  Resp: 11 17 18 19   Temp:  98.4 F (36.9 C)  97.9 F (36.6 C)  TempSrc:  Oral  Oral  SpO2: 99% 99% 99% 99%  Weight:      Height:        Intake/Output Summary (Last 24 hours) at 11/30/15 1555 Last data filed at 11/30/15 1438  Gross per 24 hour  Intake          3779.73 ml  Output             1500 ml  Net          2279.73 ml   Filed Weights   11/28/15 1624 11/29/15 0500  Weight: 86.2 kg (190 lb) 98.9 kg (218 lb 0.6 oz)    Examination: Physical Exam:  Constitutional: WN/WD, No acute distress; Appears calm and comfortable Eyes: Lids and conjunctivae normal, sclerae anicteric  ENMT: External Ears, Nose appear normal. Grossly normal hearing.  Neck: Appears normal, supple, no cervical masses, normal ROM, no appreciable thyromegaly Respiratory: Diminished but Clear to auscultation bilaterally, no wheezing, rales, rhonchi or crackles. Normal respiratory effort and patient is not tachypenic. No accessory muscle use.  Chest Wall: Chemo-Port in Place with no erythema Cardiovascular: Tachycardic but regular rate, no appreciable  murmurs / rubs / gallops. S1 and S2 auscultated. No extremity edema. 2+ pedal pulses.  Abdomen: Soft, non-tender, non-distended. No masses palpated. No appreciable hepatosplenomegaly. Bowel sounds positive x4 GU: Deferred. Musculoskeletal: No clubbing / cyanosis of digits/nails. No joint deformity upper and lower extremities. Normal strength and muscle tone.  Skin: No rashes, lesions, ulcers. No induration on limited skin exam; Warm and dry.  Neurologic: CN 2-12 grossly intact with no focal deficits. Sensation intact in all 4 Extremities. Romberg sign cerebellar reflexes not assessed.  Psychiatric: Normal judgment and insight. Alert and oriented x 3. Normal mood and appropriate affect.   Data Reviewed: I have personally reviewed following labs and imaging studies  CBC:  Recent Labs Lab 11/28/15 1207 11/29/15 0345 11/29/15 1311 11/30/15 0444  WBC 1.8* 1.5* 1.5* 1.4*  NEUTROABS 0.9*  --  0.5* 0.3*  HGB 8.8* 7.4* 7.6* 6.8*  HCT 26.0* 21.5* 22.1* 19.8*  MCV 97.7 96.4 97.4 96.6  PLT 65* 59* 57* 66*   Basic  Metabolic Panel:  Recent Labs Lab 11/28/15 1207 11/29/15 0345 11/30/15 0444  NA 133* 131* 134*  K 3.5 3.2* 2.8*  CL  --  105 103  CO2 19* 19* 25  GLUCOSE 141* 120* 153*  BUN 12.1 10 7   CREATININE 0.7 0.75 0.53  CALCIUM 8.9 6.9* 7.5*  MG  --   --  1.2*  PHOS  --   --  2.6   GFR: Estimated Creatinine Clearance: 110 mL/min (by C-G formula based on SCr of 0.53 mg/dL). Liver Function Tests:  Recent Labs Lab 11/28/15 1207 11/29/15 0345 11/30/15 0444  AST 20 43* 24  ALT 26 38 31  ALKPHOS 93 60 60  BILITOT 0.68 0.6 0.4  PROT 7.4 6.2* 6.2*  ALBUMIN 3.5 3.1* 3.1*   No results for input(s): LIPASE, AMYLASE in the last 168 hours. No results for input(s): AMMONIA in the last 168 hours. Coagulation Profile:  Recent Labs Lab 11/29/15 0345  INR 1.26   Cardiac Enzymes: No results for input(s): CKTOTAL, CKMB, CKMBINDEX, TROPONINI in the last 168 hours. BNP (last 3  results) No results for input(s): PROBNP in the last 8760 hours. HbA1C: No results for input(s): HGBA1C in the last 72 hours. CBG: No results for input(s): GLUCAP in the last 168 hours. Lipid Profile: No results for input(s): CHOL, HDL, LDLCALC, TRIG, CHOLHDL, LDLDIRECT in the last 72 hours. Thyroid Function Tests: No results for input(s): TSH, T4TOTAL, FREET4, T3FREE, THYROIDAB in the last 72 hours. Anemia Panel: No results for input(s): VITAMINB12, FOLATE, FERRITIN, TIBC, IRON, RETICCTPCT in the last 72 hours. Sepsis Labs:  Recent Labs Lab 11/28/15 1606  LATICACIDVEN 1.79    Recent Results (from the past 240 hour(s))  Culture, Blood     Status: None (Preliminary result)   Collection Time: 11/28/15  2:24 PM  Result Value Ref Range Status   BLOOD CULTURE, ROUTINE Preliminary report  Preliminary   RESULT 1 Comment  Preliminary    Comment: No growth detected at this time.  Culture, Blood     Status: None (Preliminary result)   Collection Time: 11/28/15  2:25 PM  Result Value Ref Range Status   BLOOD CULTURE, ROUTINE Preliminary report  Preliminary   RESULT 1 Comment  Preliminary    Comment: No growth detected at this time.  Urine culture     Status: Abnormal (Preliminary result)   Collection Time: 11/28/15  5:14 PM  Result Value Ref Range Status   Specimen Description URINE, CLEAN CATCH  Final   Special Requests NONE  Final   Culture (A)  Final    >=100,000 COLONIES/mL ESCHERICHIA COLI SUSCEPTIBILITIES TO FOLLOW Performed at [facility]    Report Status PENDING  Incomplete  MRSA PCR Screening     Status: None   Collection Time: 11/28/15  8:00 PM  Result Value Ref Range Status   MRSA by PCR NEGATIVE NEGATIVE Final    Comment:        The GeneXpert MRSA Assay (FDA approved for NASAL specimens only), is one component of a comprehensive MRSA colonization surveillance program. It is not intended to diagnose MRSA infection nor to guide or monitor treatment  for MRSA infections.   Culture, blood (Routine X 2) w Reflex to ID Panel     Status: None (Preliminary result)   Collection Time: 11/29/15  3:12 PM  Result Value Ref Range Status   Specimen Description BLOOD LEFT ARM  Final   Special Requests IN PEDIATRIC BOTTLE 3CC  Final  Culture   Final    NO GROWTH < 24 HOURS Performed at [facility]    Report Status PENDING  Incomplete  Culture, blood (Routine X 2) w Reflex to ID Panel     Status: None (Preliminary result)   Collection Time: 11/29/15  3:12 PM  Result Value Ref Range Status   Specimen Description BLOOD RIGHT ARM  Final   Special Requests BOTTLES DRAWN AEROBIC AND ANAEROBIC 5CC  Final   Culture   Final    NO GROWTH < 24 HOURS Performed at [facility]    Report Status PENDING  Incomplete    Radiology Studies: Dg Chest 2 View  Result Date: 11/28/2015 CLINICAL DATA:  Fever, sore throat, cough and chills. Myalgias. History of ovarian cancer. EXAM: CHEST  2 VIEW COMPARISON:  None. FINDINGS: Right IJ port catheter tip mid SVC level. Normal heart size and vascularity. No focal pneumonia, collapse or consolidation. Negative for edema, effusion or pneumothorax. Minor central bronchitic change. Trachea is midline. No significant osseous finding. Coronary stents present. IMPRESSION: Mild central bronchitic change.  No superimposed acute process. Electronically Signed   By: Jerilynn Mages.  Shick M.D.   On: 11/28/2015 16:40   Ct Head Wo Contrast  Result Date: 11/28/2015 CLINICAL DATA:  35 y/o F; dizziness with severe headache. Patient undergoing chemotherapy for ovarian cancer. EXAM: CT HEAD WITHOUT CONTRAST TECHNIQUE: Contiguous axial images were obtained from the base of the skull through the vertex without intravenous contrast. COMPARISON:  None available. FINDINGS: Brain: No evidence of acute infarction, hemorrhage, hydrocephalus, extra-axial collection or mass lesion/mass effect. Vascular: No hyperdense vessel or unexpected  calcification. Skull: Normal. Negative for fracture or focal lesion. Sinuses/Orbits: No acute finding. Other: None. IMPRESSION: No acute intracranial abnormality identified. Unremarkable CT of the head for age. Electronically Signed   By: Kristine Garbe M.D.   On: 11/28/2015 17:05   Dg Chest Port 1 View  Result Date: 11/30/2015 CLINICAL DATA:  Low blood pressure. EXAM: PORTABLE CHEST 1 VIEW COMPARISON:  November 28, 2015 FINDINGS: Stable right Port-A-Cath. No other interval changes or acute abnormalities. IMPRESSION: No active disease. Electronically Signed   By: Dorise Bullion III M.D   On: 11/30/2015 10:56   Scheduled Meds: . sodium chloride   Intravenous Once  . acyclovir  660 mg Intravenous Q8H  . aspirin  81 mg Oral Q breakfast  . atorvastatin  40 mg Oral QHS  . B-complex with vitamin C  1 tablet Oral Daily  . ceFEPime (MAXIPIME) IV  2 g Intravenous Q8H  . prasugrel  10 mg Oral Q breakfast  . vancomycin  1,000 mg Intravenous Q8H  . venlafaxine XR  150 mg Oral Q breakfast   Continuous Infusions: . sodium chloride 100 mL/hr at 11/30/15 1400     LOS: 2 days   Kerney Elbe, DO [facility] Pager [phone]  If 7PM-7AM, please contact night-coverage [email or web address] Password [email or web address] 11/30/2015, 3:55 PM

## 2015-11-30 NOTE — Consult Note (Signed)
[facility] CONSULT NOTE  Patient Care Team: Provider Not In System as PCP - General  CHIEF COMPLAINTS/PURPOSE OF CONSULTATION:  Neutropenic fever, cancer patient receiving chemotherapy  HISTORY OF PRESENTING ILLNESS:  Shelley Vega 35 y.o. female is a patient currently receiving chemotherapy under the guidance of physician from [facility]  She was admitted since 11/28/2015 after presentation with chills, myalgia and profound weakness. On review of her records, on admission, she was noted to have profound pancytopenia with white blood cell count of 1.8, hemoglobin of 8.8 and platelet count of 65,000 Since admission, she had progressive pancytopenia with her white count as low as 1.4, hemoglobin 6.8 and platelet count of 66,000 she received 1 unit of blood transfusion today with symptomatic improvement of her weakness She is currently in the stepdown unit and is noted to be tachycardic She has been having some cough and sensation of sore throat She denies dysuria, frequency or urgency or diarrhea The patient is started on broad-spectrum IV antibiotics Influenza panel is negative. Blood culture and urine culture were pending Preliminary results from the urine culture detected Escherichia coli  From the cancer diagnosis stand point, she was diagnosed with a recurring cancer in May 2016 status post debulking surgery on the right side. She received chemotherapy with adjuvant treatment carboplatin and Taxol, completed all her treatment with negative imaging study in December 2016. Subsequently, she presented to her GYN oncologist for completion surgery on 07/19/2015  In the operative report, she was noted to have enlarged mass on the left ovary. 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 She has not had any imaging study since December 2016 With her last cycle of treatment,  she has symptomatic anemia requiring blood transfusion. She saw the NP here at [facility] on the day of admission feeling weak, anorexic, shortness of breath on minimal exertion along with coughing. She also has severe peripheral neuropathy affecting her feet. Previously, she was placed on gabapentin with no success of treating this. She has been taking oxycodone for pain In the outpatient office, she was noted to be febrile and blood pressure was noted to be low She was subsequently transported to the emergency department for further management and was admitted to the stepdown unit for preliminary diagnosis of sepsis secondary to possible urinary tract infection  MEDICAL HISTORY:  Past Medical History:  Diagnosis Date  . Heart attack   . Ovarian cancer ([place]) 2016   right ovary    SURGICAL HISTORY: Past Surgical History:  Procedure Laterality Date  . ABDOMINAL HYSTERECTOMY    . BREAST REDUCTION SURGERY    . CORONARY ANGIOPLASTY WITH STENT PLACEMENT    . MOUTH SURGERY      SOCIAL HISTORY: Social History   Social History  . Marital status: Single    Spouse name: N/A  . Number of children: N/A  . Years of education: N/A   Occupational History  . Not on file.   Social History Main Topics  . Smoking status: Former Smoker    Packs/day: 1.00    Years: 8.00    Types: Cigarettes    Quit date: 08/19/2011  . Smokeless tobacco: Never Used  . 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 Drug use: Unknown  . Sexual activity: Not on file   Other Topics Concern  . Not on  file   Social History Narrative  . No narrative on file    FAMILY HISTORY: Family History  Problem Relation Age of Onset  . Colon polyps Father     about 2-3 removed following colonoscopy  . Irritable bowel syndrome Brother     1 previous colonoscopy - no polyps found  . Breast cancer Paternal Aunt 64    "low level" breast cancer  . Prostate cancer Maternal Grandfather 41  .  Lymphoma Maternal Grandfather 71    large B-cell lymphoma  . Heart Problems Paternal Grandmother   . Heart attack Paternal Grandfather   . Cancer Other 76    unknown type    ALLERGIES:  is allergic to lisinopril.  MEDICATIONS:  Current Facility-Administered Medications  Medication Dose Route Frequency Provider Last Rate Last Dose  . 0.9 %  sodium chloride infusion   Intravenous Continuous Omair Latif Sheikh, DO 100 mL/hr at 11/30/15 1400    . 0.9 %  sodium chloride infusion   Intravenous Once Ritta Slot, NP      . acetaminophen (TYLENOL) tablet 650 mg  650 mg Oral Q6H PRN Roney Jaffe, MD   650 mg at 11/30/15 [id number]   Or  . acetaminophen (TYLENOL) suppository 650 mg  650 mg Rectal Q6H PRN Roney Jaffe, MD      . albuterol (PROVENTIL) (2.5 MG/3ML) 0.083% nebulizer solution 3 mL  3 mL Nebulization Q6H PRN Roney Jaffe, MD      . ALPRAZolam Duanne Moron) tablet 1 mg  1 mg Oral TID PRN Kerney Elbe, DO      . aspirin chewable tablet 81 mg  81 mg Oral Q breakfast [facility], DO   81 mg at 11/30/15 0819  . atorvastatin (LIPITOR) tablet 40 mg  40 mg Oral QHS Bertram Savin Sheikh, DO   40 mg at 11/29/15 2239  . B-complex with vitamin C tablet 1 tablet  1 tablet Oral Daily Kerney Elbe, DO   1 tablet at 11/30/15 0948  . ceFEPIme (MAXIPIME) 2 g in dextrose 5 % 50 mL IVPB  2 g Intravenous Q8H Christine E Shade, RPH   2 g at 11/30/15 1000  . HYDROcodone-homatropine (HYCODAN) 5-1.5 MG/5ML syrup 5 mL  5 mL Oral Q6H PRN Roney Jaffe, MD   5 mL at 11/30/15 0524  . morphine 2 MG/ML injection 2 mg  2 mg Intravenous Q4H PRN Gardiner Barefoot, NP      . ondansetron [facility]) tablet 4 mg  4 mg Oral Q6H PRN Roney Jaffe, MD       Or  . ondansetron [facility]) injection 4 mg  4 mg Intravenous Q6H PRN Roney Jaffe, MD   4 mg at 11/28/15 2017  . oxyCODONE (Oxy IR/ROXICODONE) immediate release tablet 5 mg  5 mg Oral Q4H PRN Gardiner Barefoot, NP   5 mg at 11/30/15 0835  . prasugrel  (EFFIENT) tablet 10 mg  10 mg Oral Q breakfast [place], DO   10 mg at 11/30/15 [id number]  . pregabalin (LYRICA) capsule 25 mg  25 mg Oral BID Tomekia Helton, MD      . sodium chloride flush (NS) 0.9 % injection 10-40 mL  10-40 mL Intracatheter PRN Roney Jaffe, MD      . Tbo-Filgrastim [facility]) injection 480 mcg  480 mcg Subcutaneous q1800 Heath Lark, MD      . traMADol (ULTRAM) tablet 50 mg  50 mg Oral Q6H PRN Gardiner Barefoot, NP      .  vancomycin (VANCOCIN) IVPB 1000 mg/200 mL premix  1,000 mg Intravenous Q8H Christine E Shade, RPH   1,000 mg at 11/30/15 0136  . venlafaxine XR (EFFEXOR-XR) 24 hr capsule 150 mg  150 mg Oral Q breakfast Roney Jaffe, MD   150 mg at 11/30/15 [id number]    REVIEW OF SYSTEMS:   Eyes: Denies blurriness of vision, double vision or watery eyes Ears, nose, mouth, throat, and face: Denies mucositis or sore throat Cardiovascular: Denies palpitation, chest discomfort or lower extremity swelling Skin: Denies abnormal skin rashes Lymphatics: Denies new lymphadenopathy or easy bruising Behavioral/Psych: Mood is stable, no new changes  All other systems were reviewed with the patient and are negative.  PHYSICAL EXAMINATION: ECOG PERFORMANCE STATUS: 2 - Symptomatic, <50% confined to bed  Vitals:   11/30/15 1300 11/30/15 1438  BP:  119/76  Pulse: 94 91  Resp: 18 19  Temp:  97.9 F (36.6 C)   Filed Weights   11/28/15 1624 11/29/15 0500  Weight: 190 lb (86.2 kg) 218 lb 0.6 oz (98.9 kg)    GENERAL:alert,In mild respiratory distress. She looked mildly cushingoid and pale, comfortable SKIN: skin color is pale, texture, turgor are normal, no rashes or significant lesions EYES: normal, conjunctiva are pale and non-injected, sclera clear OROPHARYNX:no exudate, no erythema and lips, buccal mucosa, and tongue normal  NECK: supple, thyroid normal size, non-tender, without nodularity LYMPH:  no palpable lymphadenopathy in the cervical, axillary or inguinal LUNGS:  clear to auscultation and percussion with normal breathing effort HEART: Tachycardia, regular rhythm and no murmurs with mild bilateral lower extremity edema ABDOMEN:abdomen soft, non-tender and normal bowel sounds Musculoskeletal:no cyanosis of digits and no clubbing . Port site looks okay PSYCH: alert & oriented x 3 with fluent speech NEURO: no focal motor/sensory deficits  LABORATORY DATA:  I have reviewed the data as listed Lab Results  Component Value Date   WBC 1.4 (LL) 11/30/2015   HGB 6.8 (LL) 11/30/2015   HCT 19.8 (L) 11/30/2015   MCV 96.6 11/30/2015   PLT 66 (L) 11/30/2015    Recent Labs  08/25/15 2222  11/28/15 1207 11/29/15 0345 11/30/15 0444  NA 137  < > 133* 131* 134*  K 2.9*  < > 3.5 3.2* 2.8*  CL 106  --   --  105 103  CO2 25  < > 19* 19* 25  GLUCOSE 103*  < > 141* 120* 153*  BUN 24*  < > 12.1 10 7   CREATININE 0.50  < > 0.7 0.75 0.53  CALCIUM 7.9*  < > 8.9 6.9* 7.5*  GFRNONAA >60  --   --  >60 >60  GFRAA >60  --   --  >60 >60  PROT 6.0*  < > 7.4 6.2* 6.2*  ALBUMIN 3.7  < > 3.5 3.1* 3.1*  AST 17  < > 20 43* 24  ALT 23  < > 26 38 31  ALKPHOS 60  < > 93 60 60  BILITOT 0.5  < > 0.68 0.6 0.4  < > = values in this interval not displayed.  RADIOGRAPHIC STUDIES: I have personally reviewed the radiological images as listed and agreed with the findings in the report. Dg Chest 2 View  Result Date: 11/28/2015 CLINICAL DATA:  Fever, sore throat, cough and chills. Myalgias. History of ovarian cancer. EXAM: CHEST  2 VIEW COMPARISON:  None. FINDINGS: Right IJ port catheter tip mid SVC level. Normal heart size and vascularity. No focal pneumonia, collapse or consolidation. Negative for edema, effusion  or pneumothorax. Minor central bronchitic change. Trachea is midline. No significant osseous finding. Coronary stents present. IMPRESSION: Mild central bronchitic change.  No superimposed acute process. Electronically Signed   By: Jerilynn Mages.  Shick M.D.   On: 11/28/2015 16:40   Ct  Head Wo Contrast  Result Date: 11/28/2015 CLINICAL DATA:  35 y/o F; dizziness with severe headache. Patient undergoing chemotherapy for ovarian cancer. EXAM: CT HEAD WITHOUT CONTRAST TECHNIQUE: Contiguous axial images were obtained from the base of the skull through the vertex without intravenous contrast. COMPARISON:  None available. FINDINGS: Brain: No evidence of acute infarction, hemorrhage, hydrocephalus, extra-axial collection or mass lesion/mass effect. Vascular: No hyperdense vessel or unexpected calcification. Skull: Normal. Negative for fracture or focal lesion. Sinuses/Orbits: No acute finding. Other: None. IMPRESSION: No acute intracranial abnormality identified. Unremarkable CT of the head for age. Electronically Signed   By: Kristine Garbe M.D.   On: 11/28/2015 17:05   Dg Chest Port 1 View  Result Date: 11/30/2015 CLINICAL DATA:  Low blood pressure. EXAM: PORTABLE CHEST 1 VIEW COMPARISON:  November 28, 2015 FINDINGS: Stable right Port-A-Cath. No other interval changes or acute abnormalities. IMPRESSION: No active disease. Electronically Signed   By: Dorise Bullion III M.D   On: 11/30/2015 10:56    ASSESSMENT & PLAN:  Neutropenic sepsis secondary to Escherichia coli Cultures are pending but preliminary suggested Escherichia coli sepsis Continue broad-spectrum IV antibiotics I discussed with the patient about G-CSF support and she agreed to proceed She will receive G-CSF daily until [facility] is greater than 1.5  Severe anemia secondary to chemotherapy Recommend blood transfusion support to keep hemoglobin greater than 8 in view of prior history of heart attack  Thrombocytopenia Secondary to chemotherapy There is no contraindication to remain on aspirin or LMWH as long as the platelet is greater than 50,000. 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 The risk  and benefits of Lyrica, is discussed and she agreed to proceed  Mild protein calorie malnutrition Likely due to sepsis Continue nutritional intake as tolerated  Recurrent ovarian cancer Continue supportive care for now She is not due for chemotherapy for several weeks  CODE STATUS Full code  Discharge planning Not ready to be discharged until she stabilizes, minimum another 2 to 3 days of hospital stay   All questions were answered. The patient knows to call the clinic with any problems, questions or concerns.    Heath Lark, MD 11/30/2015 4:08 PM

## 2015-11-30 NOTE — Progress Notes (Signed)
[facility] on call. Paged to report pt having bouts of diarrhea. New orders given and executed.

## 2015-11-30 NOTE — Progress Notes (Addendum)
Pt coughing.  Hycodan po given per MD prn order.   Pt reports cough is better.

## 2015-12-01 DIAGNOSIS — D61818 Other pancytopenia: Secondary | ICD-10-CM

## 2015-12-01 DIAGNOSIS — B9620 Unspecified Escherichia coli [E. coli] as the cause of diseases classified elsewhere: Secondary | ICD-10-CM

## 2015-12-01 LAB — COMPREHENSIVE METABOLIC PANEL
ALK PHOS: 62 U/L (ref 38–126)
ALT: 35 U/L (ref 14–54)
AST: 26 U/L (ref 15–41)
Albumin: 3.2 g/dL — ABNORMAL LOW (ref 3.5–5.0)
Anion gap: 7 (ref 5–15)
BUN: 8 mg/dL (ref 6–20)
CALCIUM: 7.9 mg/dL — AB (ref 8.9–10.3)
CHLORIDE: 104 mmol/L (ref 101–111)
CO2: 24 mmol/L (ref 22–32)
CREATININE: 0.53 mg/dL (ref 0.44–1.00)
GFR calc non Af Amer: >60 mL/min (ref >60)
Glucose, Bld: 130 mg/dL — ABNORMAL HIGH (ref 65–99)
Potassium: 3.2 mmol/L — ABNORMAL LOW (ref 3.5–5.1)
SODIUM: 135 mmol/L (ref 135–145)
Total Bilirubin: 1.1 mg/dL (ref 0.3–1.2)
Total Protein: 6.2 g/dL — ABNORMAL LOW (ref 6.5–8.1)

## 2015-12-01 LAB — CBC WITH DIFFERENTIAL/PLATELET
BASOS ABS: 0 10*3/uL (ref 0.0–0.1)
BASOS PCT: 1 %
EOS ABS: 0.1 10*3/uL (ref 0.0–0.7)
Eosinophils Relative: 2 %
HCT: 25.8 % — ABNORMAL LOW (ref 36.0–46.0)
HEMOGLOBIN: 8.9 g/dL — AB (ref 12.0–15.0)
LYMPHS PCT: 16 %
Lymphs Abs: 0.6 10*3/uL — ABNORMAL LOW (ref 0.7–4.0)
MCH: 31.7 pg (ref 26.0–34.0)
MCHC: 34.5 g/dL (ref 30.0–36.0)
MCV: 91.8 fL (ref 78.0–100.0)
MONOS PCT: 17 %
Monocytes Absolute: 0.7 10*3/uL (ref 0.1–1.0)
NEUTROS ABS: 2.6 10*3/uL (ref 1.7–7.7)
NEUTROS PCT: 64 %
PLATELETS: 70 10*3/uL — AB (ref 150–400)
RBC: 2.81 MIL/uL — ABNORMAL LOW (ref 3.87–5.11)
RDW: 20.7 % — ABNORMAL HIGH (ref 11.5–15.5)
WBC MORPHOLOGY: INCREASED
WBC: 4 10*3/uL (ref 4.0–10.5)

## 2015-12-01 LAB — TYPE AND SCREEN
ABO/RH(D): A POS
ANTIBODY SCREEN: NEGATIVE
UNIT DIVISION: 0
UNIT DIVISION: 0

## 2015-12-01 LAB — URINE CULTURE

## 2015-12-01 MED ORDER — DEXTROSE 5 % IV SOLN
2.0000 g | INTRAVENOUS | Status: AC
  Administered 2015-12-01 – 2015-12-02 (×2): 2 g via INTRAVENOUS
  Filled 2015-12-01 (×3): qty 2

## 2015-12-01 MED ORDER — LOSARTAN POTASSIUM 50 MG PO TABS
100.0000 mg | ORAL_TABLET | Freq: Every day | ORAL | Status: DC
  Administered 2015-12-02: 100 mg via ORAL
  Filled 2015-12-01: qty 2

## 2015-12-01 MED ORDER — POTASSIUM CHLORIDE CRYS ER 20 MEQ PO TBCR
40.0000 meq | EXTENDED_RELEASE_TABLET | Freq: Two times a day (BID) | ORAL | Status: DC
  Administered 2015-12-01 – 2015-12-02 (×3): 40 meq via ORAL
  Filled 2015-12-01 (×3): qty 2

## 2015-12-01 NOTE — Progress Notes (Addendum)
Cough worsening.  Hycodan po given per MD prn order.  Pt reports cough is better.

## 2015-12-01 NOTE — Progress Notes (Signed)
PROGRESS NOTE    Shelley Vega  [id number] DOB: 1981/01/02 DOA: 11/28/2015 PCP: PROVIDER NOT IN SYSTEM   Brief Narrative:  Shelley Vega is a 35 y.o. female with history of CAD/ MI w cor stent x 2 and ovarian cancer getting active chemoRx.  Last round (Taxol/ [place]) was on 11/18/15, 10 days ago.  She presents with chills, myalgias, nausea for the last 24 hours, worsening today.  In ED BP was 70's , has improved to mid 90's after 3 L IV NS.  Usual BP at home is 110-120 syst.  No abd pain, no dysuria, or diarrhea.  Has had some URI symptoms with runny nose and dry cough.  No CP.  Aching legs/ arms/ body when the chills come. She was admitted to SDU/ICU and and remained febrile with her [place] dropping. This morning the patient is feeling better and ANC improved to 2600. Patient was transferred out of ICU/SDU and her Abx were changed to narrow treatment therapy for her E.Coli UTI.  Assessment & Plan:   Active Problems:   CAD (coronary artery disease) - sp cor stent x 2   Ovarian cancer (HCC)   Severe sepsis (HCC)   Hypotension   Neutropenia with fever (HCC)   Pyuria   Pancytopenia ([place])- due to chemoRx   Neutropenic fever (HCC)  Neutropenic Fever likely from E Coli UTI r/o URI, improving -Neutropenic Precautions; ANC is going from 900 -> 500 -> 300 -> 2600 -IVF Rehydration decreased from 100 mL/hr -> 50 mL/hr -D/C'd IV Acycolvir, IV Cefepime and IV Vancomycin -Patient afebrile for 24 hours -Flu Negative -Blood Cx x 2 were cancelled in the hospital as they were drawn at [facility] (showed NGTD); Reordered Blood Cultures here which showed No Growth < 24 hours.  -Urine Culture showed >100,000 CFU of E. Coli; UA was Turbid and showed Many Bacteria, Large Leukocytes, Negative Nitrites, and TNTC WBC however did show 6-30 Squamous Epithelial Cells: **Was Pan-sensitive and switched Abx to Ceftriaxone IV 2 grams** -Will obtain Sputum Cx -Concern of having bouts of Diarrhea last night but  none this AM. -Discussed Case with Dr. Denman George of Gyn-Onc and I personally also spoke with Dr. Alycia Rossetti (patients Primary Heme/Onc) that patient is in the hospital. (PAGER is [phone]) -Repeat CXR this AM showed no active disease as initial CXR showed mild central bronchitic changes  -Consulted Dr. Heath Lark in Hematology/Oncology for further Recc's and Management.  -Tbo-Filgrastim injection of 480 mcg given sq Daily for 7 doses (will discuss with Dr. Alvy Bimler about stopping now as her [place] is 2600).  -PT to Eval and Treat  Severe Sepsis 2/2 to E Coli UTI r/o URI, resolved  -Sepsis Physiology Resolving as patient no longer Hypotensive -Changed IV Abx to IV Ceftriaxone as E Coli Was Sensitive -Pan-Culture  -IV Fluids decreased from NS at 100 mL/hr to NS at 50 mL/hr -Notified PCCM and will officially Consult if patient worsens or decompensates -C/w Alubetrol Nebs q6h  Ovarian Cancer on Current Chemotherapy with Taxol/Carboplatin -Last Chemotherapy Tx was 11/19/2015; Patient take Decadron with Chemotherapy -C/w Pain Control with Hydrocodone-Homatropine 5 mL po q6h for Cough, Oxycodone IR 5 mg po q4hprn, Tramadol 50 mg po q6hprn for Moderate Pain, and with Morphine 2 mg IV q4hprn -Consulted Dr. Heath Lark for further evaluation and recommendations -Per Dr. Alvy Bimler continue supportive care and chemotherapy is not due until several weeks.   Pancytopenia, improving after Tbo-Filgrastim and Irradiated Blood -Likely 2/2 to Chemotherapy -WBC now 4.0, Hb was  8.9, and Plt was  70 -Continue to Monitor and repeat CBC with Differential in AM -Hematology/Oncology Dr. Alvy Bimler consulted for additional recc's  Anemia likely from Chemotherapy s/p Transfusion of 2 Units of Irradiated pRBC -Patient's Hb/Hct went from 7.6/22.1 -> 6.8/19.8 -> 8.9/25.8 -Transfused 2 unit of Irradiated pRBC -Repeat CBC in AM  Hypokalemia -Patient's K+ was 3.2 this AM -Repleted po Kcl 40 mEQ x2 -Repeat BMP in  AM  Hyponatremia, improved -Patient's Na+ was 131 and now is 135 -C/w low dose IVF Hydration and Repeat BMP in AM  CAD s/p 2 stents and MI -C/w ASA 81 mg, Prasugrel 10 mg tablets, and Atorvastatin 40 mg po Daily -Restarted Metoprolol 100 mg po Daily; Will restart Losartan 10 mg po Daily wb Tomorrow  Hypertension -C/w Metoprolol Succinate 100 mg po Daily wb -Restart Home Dose of Losartan 100 mg po Daily  ADD Held patient's Home Adderall   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 Alvy Bimler -C/w Tramadol 50 mg po q6hprn, Oxycodone IR 5 mg po q4hprn, and Morphine 2 mg IV q4hprn  DVT prophylaxis: SCD's Code Status: Full Family Communication: Discussed with Family at Bedside Disposition Plan: Likely Home  Consultants:   None- Spoke to Dr. Alycia Rossetti patient's Primary Hematologist/Oncologist at [facility] and updated her on the Status of the Patient  Hematology/Oncology Dr. Heath Lark   Procedures: None  Antimicrobials: IV Cefepime, IV Vancomycin; IV Acyclovir D/C'd  Subjective: Seen and examined at bedside and she stated she was feeling great this Am. No complaints of cough this AM and denied SOB, N/V, Abdominal Pain or Chest Pain. No new complaints and would like to ambulate.   Objective: Vitals:   12/01/15 0600 12/01/15 0628 12/01/15 0800 12/01/15 0805  BP:    108/60  Pulse:    81  Resp:  18  17  Temp:   98.2 F (36.8 C)   TempSrc:   Oral   SpO2:    96%  Weight: 85.4 kg (188 lb 4.4 oz)     Height:        Intake/Output Summary (Last 24 hours) at 12/01/15 1111 Last data filed at 12/01/15 0800  Gross per 24 hour  Intake          4233.17 ml  Output                0 ml  Net          4233.17 ml   Filed Weights   11/28/15 1624 11/29/15 0500 12/01/15 0600  Weight: 86.2 kg (190 lb) 98.9 kg (218 lb 0.6 oz) 85.4 kg (188 lb 4.4 oz)    Examination: Physical Exam:  Constitutional: WN/WD slightly obese, No acute distress; Appears calm and  comfortable Eyes: Lids and conjunctivae normal, sclerae anicteric  ENMT: External Ears, Nose appear normal. Grossly normal hearing.  Neck: Appears normal, supple, no cervical masses, normal ROM, no appreciable thyromegaly, no Appreciable JVD Respiratory: Clear to auscultation bilaterally, no wheezing, rales, rhonchi or crackles. Normal respiratory effort and patient is not tachypenic. No accessory muscle use.  Chest Wall: Chemo-Port in Place with no erythema Cardiovascular: RRR, no appreciable murmurs / rubs / gallops. S1 and S2 auscultated. No extremity edema. 2+ pedal pulses.  Abdomen: Soft, non-tender, non-distended. No masses palpated. No appreciable hepatosplenomegaly. Bowel sounds positive x4 GU: Deferred. Musculoskeletal: No clubbing / cyanosis of digits/nails. No joint deformity upper and lower extremities. Normal strength and muscle tone.  Skin: No rashes, lesions, ulcers.  No induration on limited skin exam; Warm and dry.  Neurologic: CN 2-12 grossly intact with no focal deficits. Sensation intact in all 4 Extremities. Romberg sign cerebellar reflexes not assessed.  Psychiatric: Normal judgment and insight. Alert and oriented x 3. Normal mood and appropriate affect.   Data Reviewed: I have personally reviewed following labs and imaging studies  CBC:  Recent Labs Lab 11/28/15 1207 11/29/15 0345 11/29/15 1311 11/30/15 0444 11/30/15 1700 12/01/15 0339  WBC 1.8* 1.5* 1.5* 1.4* 1.4* 4.0  NEUTROABS 0.9*  --  0.5* 0.3*  --  2.6  HGB 8.8* 7.4* 7.6* 6.8* 7.8* 8.9*  HCT 26.0* 21.5* 22.1* 19.8* 22.4* 25.8*  MCV 97.7 96.4 97.4 96.6 95.3 91.8  PLT 65* 59* 57* 66* 73* 70*   Basic Metabolic Panel:  Recent Labs Lab 11/28/15 1207 11/29/15 0345 11/30/15 0444 12/01/15 0917  NA 133* 131* 134* 135  K 3.5 3.2* 2.8* 3.2*  CL  --  105 103 104  CO2 19* 19* 25 24  GLUCOSE 141* 120* 153* 130*  BUN 12.1 10 7 8   CREATININE 0.7 0.75 0.53 0.53  CALCIUM 8.9 6.9* 7.5* 7.9*  MG  --   --  1.2*   --   PHOS  --   --  2.6  --    GFR: Estimated Creatinine Clearance: 101.6 mL/min (by C-G formula based on SCr of 0.53 mg/dL). Liver Function Tests:  Recent Labs Lab 11/28/15 1207 11/29/15 0345 11/30/15 0444 12/01/15 0917  AST 20 43* 24 26  ALT 26 38 31 35  ALKPHOS 93 60 60 62  BILITOT 0.68 0.6 0.4 1.1  PROT 7.4 6.2* 6.2* 6.2*  ALBUMIN 3.5 3.1* 3.1* 3.2*   No results for input(s): LIPASE, AMYLASE in the last 168 hours. No results for input(s): AMMONIA in the last 168 hours. Coagulation Profile:  Recent Labs Lab 11/29/15 0345  INR 1.26   Cardiac Enzymes: No results for input(s): CKTOTAL, CKMB, CKMBINDEX, TROPONINI in the last 168 hours. BNP (last 3 results) No results for input(s): PROBNP in the last 8760 hours. HbA1C: No results for input(s): HGBA1C in the last 72 hours. CBG: No results for input(s): GLUCAP in the last 168 hours. Lipid Profile: No results for input(s): CHOL, HDL, LDLCALC, TRIG, CHOLHDL, LDLDIRECT in the last 72 hours. Thyroid Function Tests: No results for input(s): TSH, T4TOTAL, FREET4, T3FREE, THYROIDAB in the last 72 hours. Anemia Panel: No results for input(s): VITAMINB12, FOLATE, FERRITIN, TIBC, IRON, RETICCTPCT in the last 72 hours. Sepsis Labs:  Recent Labs Lab 11/28/15 1606  LATICACIDVEN 1.79    Recent Results (from the past 240 hour(s))  Culture, Blood     Status: None (Preliminary result)   Collection Time: 11/28/15  2:24 PM  Result Value Ref Range Status   BLOOD CULTURE, ROUTINE Preliminary report  Preliminary   RESULT 1 Comment  Preliminary    Comment: No growth detected at this time.  Culture, Blood     Status: None (Preliminary result)   Collection Time: 11/28/15  2:25 PM  Result Value Ref Range Status   BLOOD CULTURE, ROUTINE Preliminary report  Preliminary   RESULT 1 Comment  Preliminary    Comment: No growth detected at this time.  Urine culture     Status: Abnormal   Collection Time: 11/28/15  5:14 PM  Result Value  Ref Range Status   Specimen Description URINE, CLEAN CATCH  Final   Special Requests NONE  Final   Culture >=100,000 COLONIES/mL ESCHERICHIA COLI (A)  Final   Report Status 12/01/2015 FINAL  Final   Organism ID, Bacteria ESCHERICHIA COLI (A)  Final      Susceptibility   Escherichia coli - MIC*    AMPICILLIN 4 SENSITIVE Sensitive     CEFAZOLIN <=4 SENSITIVE Sensitive     CEFTRIAXONE <=1 SENSITIVE Sensitive     CIPROFLOXACIN <=0.25 SENSITIVE Sensitive     GENTAMICIN <=1 SENSITIVE Sensitive     IMIPENEM <=0.25 SENSITIVE Sensitive     NITROFURANTOIN <=16 SENSITIVE Sensitive     TRIMETH/SULFA <=20 SENSITIVE Sensitive     AMPICILLIN/SULBACTAM <=2 SENSITIVE Sensitive     PIP/TAZO <=4 SENSITIVE Sensitive     Extended ESBL NEGATIVE Sensitive     * >=100,000 COLONIES/mL ESCHERICHIA COLI  MRSA PCR Screening     Status: None   Collection Time: 11/28/15  8:00 PM  Result Value Ref Range Status   MRSA by PCR NEGATIVE NEGATIVE Final    Comment:        The GeneXpert MRSA Assay (FDA approved for NASAL specimens only), is one component of a comprehensive MRSA colonization surveillance program. It is not intended to diagnose MRSA infection nor to guide or monitor treatment for MRSA infections.   Culture, blood (Routine X 2) w Reflex to ID Panel     Status: None (Preliminary result)   Collection Time: 11/29/15  3:12 PM  Result Value Ref Range Status   Specimen Description BLOOD LEFT ARM  Final   Special Requests IN PEDIATRIC BOTTLE 3CC  Final   Culture   Final    NO GROWTH < 24 HOURS Performed at [facility]    Report Status PENDING  Incomplete  Culture, blood (Routine X 2) w Reflex to ID Panel     Status: None (Preliminary result)   Collection Time: 11/29/15  3:12 PM  Result Value Ref Range Status   Specimen Description BLOOD RIGHT ARM  Final   Special Requests BOTTLES DRAWN AEROBIC AND ANAEROBIC 5CC  Final   Culture   Final    NO GROWTH < 24 HOURS Performed at [facility]    Report Status PENDING  Incomplete    Radiology Studies: Dg Chest Port 1 View  Result Date: 11/30/2015 CLINICAL DATA:  Low blood pressure. EXAM: PORTABLE CHEST 1 VIEW COMPARISON:  November 28, 2015 FINDINGS: Stable right Port-A-Cath. No other interval changes or acute abnormalities. IMPRESSION: No active disease. Electronically Signed   By: Dorise Bullion III M.D   On: 11/30/2015 10:56   Scheduled Meds: . sodium chloride   Intravenous Once  . aspirin  81 mg Oral Q breakfast  . atorvastatin  40 mg Oral QHS  . B-complex with vitamin C  1 tablet Oral Daily  . cefTRIAXone (ROCEPHIN)  IV  2 g Intravenous Q24H  . [START ON 12/02/2015] losartan  100 mg Oral Q breakfast  . metoprolol succinate  100 mg Oral Q breakfast  . potassium chloride SA  40 mEq Oral BID  . prasugrel  10 mg Oral Q breakfast  . pregabalin  25 mg Oral BID  . Tbo-Filgrastim  480 mcg Subcutaneous q1800  . venlafaxine XR  150 mg Oral Q breakfast   Continuous Infusions: . sodium chloride 50 mL/hr at 12/01/15 0800    LOS: 3 days   Kerney Elbe, DO [facility] Pager [phone]  If 7PM-7AM, please contact night-coverage [email or web address] Password [email or web address] 12/01/2015, 11:11 AM

## 2015-12-01 NOTE — Progress Notes (Signed)
Shelley Vega   DOB:March 03, 1980   [id number]    Subjective: She feels better today. She felt a bit stronger since her blood transfusion. She has been afebrile  Objective:  Vitals:   12/01/15 1235 12/01/15 1358  BP: 121/77 113/70  Pulse:    Resp:  18  Temp:  99 F (37.2 C)     Intake/Output Summary (Last 24 hours) at 12/01/15 1409 Last data filed at 12/01/15 0800  Gross per 24 hour  Intake          3683.17 ml  Output                0 ml  Net          3683.17 ml    GENERAL:alert, no distress and comfortable SKIN: skin color, texture, turgor are normal, no rashes or significant lesions EYES: normal, Conjunctiva are pink and non-injected, sclera clear Musculoskeletal:no cyanosis of digits and no clubbing  NEURO: alert & oriented x 3 with fluent speech, no focal motor/sensory deficits   Labs:  Lab Results  Component Value Date   WBC 4.0 12/01/2015   HGB 8.9 (L) 12/01/2015   HCT 25.8 (L) 12/01/2015   MCV 91.8 12/01/2015   PLT 70 (L) 12/01/2015   NEUTROABS 2.6 12/01/2015    Lab Results  Component Value Date   NA 135 12/01/2015   K 3.2 (L) 12/01/2015   CL 104 12/01/2015   CO2 24 12/01/2015    Studies:  Dg Chest Port 1 View  Result Date: 11/30/2015 CLINICAL DATA:  Low blood pressure. EXAM: PORTABLE CHEST 1 VIEW COMPARISON:  November 28, 2015 FINDINGS: Stable right Port-A-Cath. No other interval changes or acute abnormalities. IMPRESSION: No active disease. Electronically Signed   By: Dorise Bullion III M.D   On: 11/30/2015 10:56    Assessment & Plan:   Neutropenic sepsis secondary to Escherichia coli Cultures are pending but preliminary suggested Escherichia coli sepsis Continue broad-spectrum IV antibiotics She has responded well to G-CSF injection with resolution of neutropenia and fever  Severe anemia secondary to chemotherapy Recommend blood transfusion support to keep hemoglobin greater than 8 in view of prior history of heart  attack  Thrombocytopenia Secondary to chemotherapy There is no contraindication to remain on aspirin or LMWH as long as the platelet is greater than 50,000. 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 The risk and benefits of Lyrica, is discussed and she agreed to proceed Plan to stay on low dose for now with plan for titration in the outpatient  Mild protein calorie malnutrition Likely due to sepsis Continue nutritional intake as tolerated  Recurrent ovarian cancer Continue supportive care for now She is not due for chemotherapy for several weeks  CODE STATUS Full code  Discharge planning The patient requested me to see her in the future for supportive care  I will touch base with her primary GYN oncologist tomorrow I suspect if she continues to improve clinically, she would be ready for discharge tomorrow with plan to transition her to oral antibiotics in the future I will return by known tomorrow to discuss discharge [place], MD 12/01/2015  2:09 PM

## 2015-12-01 NOTE — Progress Notes (Signed)
Report received from Margie Ege, RN no change in previous assessment.

## 2015-12-02 ENCOUNTER — Telehealth: Payer: Self-pay | Admitting: Hematology and Oncology

## 2015-12-02 ENCOUNTER — Other Ambulatory Visit: Payer: BC Managed Care – PPO

## 2015-12-02 ENCOUNTER — Inpatient Hospital Stay (HOSPITAL_COMMUNITY): Payer: BC Managed Care – PPO

## 2015-12-02 DIAGNOSIS — N10 Acute tubulo-interstitial nephritis: Secondary | ICD-10-CM

## 2015-12-02 LAB — COMPREHENSIVE METABOLIC PANEL
ALK PHOS: 70 U/L (ref 38–126)
ALT: 39 U/L (ref 14–54)
AST: 27 U/L (ref 15–41)
Albumin: 3.1 g/dL — ABNORMAL LOW (ref 3.5–5.0)
Anion gap: 9 (ref 5–15)
BILIRUBIN TOTAL: 0.7 mg/dL (ref 0.3–1.2)
BUN: 10 mg/dL (ref 6–20)
CALCIUM: 8.7 mg/dL — AB (ref 8.9–10.3)
CO2: 27 mmol/L (ref 22–32)
CREATININE: 0.53 mg/dL (ref 0.44–1.00)
Chloride: 101 mmol/L (ref 101–111)
GFR calc non Af Amer: >60 mL/min (ref >60)
GLUCOSE: 109 mg/dL — AB (ref 65–99)
Potassium: 3.3 mmol/L — ABNORMAL LOW (ref 3.5–5.1)
SODIUM: 137 mmol/L (ref 135–145)
Total Protein: 6.5 g/dL (ref 6.5–8.1)

## 2015-12-02 LAB — CBC WITH DIFFERENTIAL/PLATELET
BASOS PCT: 0 %
Basophils Absolute: 0 10*3/uL (ref 0.0–0.1)
EOS PCT: 0 %
Eosinophils Absolute: 0 10*3/uL (ref 0.0–0.7)
HEMATOCRIT: 24.6 % — AB (ref 36.0–46.0)
Hemoglobin: 8.6 g/dL — ABNORMAL LOW (ref 12.0–15.0)
LYMPHS ABS: 1 10*3/uL (ref 0.7–4.0)
Lymphocytes Relative: 14 %
MCH: 32.7 pg (ref 26.0–34.0)
MCHC: 35 g/dL (ref 30.0–36.0)
MCV: 93.5 fL (ref 78.0–100.0)
MONOS PCT: 8 %
Monocytes Absolute: 0.6 10*3/uL (ref 0.1–1.0)
NEUTROS ABS: 5.6 10*3/uL (ref 1.7–7.7)
Neutrophils Relative %: 78 %
Platelets: 80 10*3/uL — ABNORMAL LOW (ref 150–400)
RBC: 2.63 MIL/uL — AB (ref 3.87–5.11)
RDW: 21.3 % — AB (ref 11.5–15.5)
WBC: 7.2 10*3/uL (ref 4.0–10.5)

## 2015-12-02 LAB — PHOSPHORUS: Phosphorus: 5 mg/dL — ABNORMAL HIGH (ref 2.5–4.6)

## 2015-12-02 LAB — MAGNESIUM: Magnesium: 1 mg/dL — ABNORMAL LOW (ref 1.7–2.4)

## 2015-12-02 MED ORDER — MAGNESIUM SULFATE 2 GM/50ML IV SOLN
2.0000 g | Freq: Once | INTRAVENOUS | Status: AC
  Administered 2015-12-02: 2 g via INTRAVENOUS
  Filled 2015-12-02: qty 50

## 2015-12-02 MED ORDER — HEPARIN SOD (PORK) LOCK FLUSH 100 UNIT/ML IV SOLN
500.0000 [IU] | INTRAVENOUS | Status: AC | PRN
  Administered 2015-12-02: 500 [IU]

## 2015-12-02 MED ORDER — CEPHALEXIN 500 MG PO CAPS: 500.0000 mg | ORAL_CAPSULE | Freq: Two times a day (BID) | ORAL | 0 refills | Status: DC

## 2015-12-02 MED ORDER — IOPAMIDOL (ISOVUE-300) INJECTION 61%
15.0000 mL | Freq: Once | INTRAVENOUS | Status: DC | PRN
Start: 2015-12-02 — End: 2015-12-02
  Administered 2015-12-02: 15 mL via ORAL
  Filled 2015-12-02: qty 30

## 2015-12-02 MED ORDER — IOPAMIDOL (ISOVUE-300) INJECTION 61%
100.0000 mL | Freq: Once | INTRAVENOUS | Status: AC | PRN
  Administered 2015-12-02: 100 mL via INTRAVENOUS

## 2015-12-02 MED ORDER — CEPHALEXIN 500 MG PO CAPS: 500.0000 mg | ORAL_CAPSULE | Freq: Two times a day (BID) | ORAL | Status: DC

## 2015-12-02 MED ORDER — MAGNESIUM OXIDE 400 (241.3 MG) MG PO TABS: 400.0000 mg | ORAL_TABLET | Freq: Two times a day (BID) | ORAL | Status: DC

## 2015-12-02 MED ORDER — PREGABALIN 25 MG PO CAPS: 25.0000 mg | ORAL_CAPSULE | Freq: Two times a day (BID) | ORAL | 0 refills | Status: DC

## 2015-12-02 NOTE — Progress Notes (Signed)
PT Cancellation Note  Patient Details Name: Shelley Vega MRN: [id number] DOB: 10-03-1980   Cancelled Treatment:    Reason Eval/Treat Not Completed: PT screened, no needs identified, will sign off   [facility] 12/02/2015, 9:47 AM

## 2015-12-02 NOTE — Discharge Summary (Signed)
Physician Discharge Summary  Shelley Vega [id number] DOB: 1980/03/04 DOA: 11/28/2015  PCP: PROVIDER NOT IN SYSTEM  Admit date: 11/28/2015 Discharge date: 12/02/2015  Admitted From: Home Disposition:  Home  Recommendations for Outpatient Follow-up:  1. Follow up with PCP in 1-2 weeks 2. Follow up with Hematology/Oncology Dr. Heath Lark in 1 week 3. Follow up with Gyn-Onc at next scheduled appointment 4. Please obtain BMP/CBC in one week   Home Health: No Equipment/Devices: None  Discharge Condition: Stable and Improved CODE STATUS: FULL Diet recommendation: Heart Healthy   Brief/Interim Summary: Shelley Vega a 35 y.o.femalewith history of CAD/ MI w cor stent x 2 and ovarian cancer getting active chemoRx. Last round (Taxol/ [place]) was on 11/18/15, 10 days ago. She presents with chills, myalgias, nausea for the last 24 hours, worsening today. In ED BP was 70's , has improved to mid 90's after 3 L IV NS. Usual BP at home is 110-120 syst. No abd pain, no dysuria, or diarrhea. Has had some URI symptoms with runny nose and dry cough. No CP. Aching legs/ arms/ body when the chills come. She was admitted to SDU/ICU and and remained febrile with her [place] dropping. Patient's [place] dropped to 300 and was found to have a Ecoli Pyleonephritis/UTI that was pan-sensitive so Abx Treatment was Narrowed to Rocephin. Hematology/Oncology Dr. Heath Lark was consulted for further recommendations and patient was given 1 injection of Tbo-Filgrastim and her ANC improved steadily as well as her WBC. Today WBC was 7 and ANC was 5600. Patient improved and felt well and was medically stable at this Time to be D/C'd Home and follow up with PCP and with Dr. Alvy Bimler as an outpatient.   Discharge Diagnoses:  Active Problems:   CAD (coronary artery disease) - sp cor stent x 2   Ovarian cancer (HCC)   Severe sepsis (HCC)   Hypotension   Neutropenia with fever (HCC)   Pyuria   Pancytopenia ([place])- due  to chemoRx   Neutropenic fever ([place])  Neutropenic Fever likely from with E Coli Pyelonephritis/UTI -Neutropenic Precautions while in the Hospital; [place] went from 900 -> 500 -> 300 -> 2600 -> 5600 -IVF Rehydration at 50 mL/hr while she was in Hospital -D/C'd IV Acycolvir, IV Cefepime and IV Vancomycin -> Narrowed Therapy to po Keflex -Patient afebrile for 48 hours -Flu Negative -Blood Cx x 2 were cancelled in the hospital as they were drawn at [facility] (showed NGTD); Reordered Blood Cultures here which showed No Growth < 24 hours.  -Urine Culture showed >100,000 CFU of E. Coli; UA was Turbid and showed Many Bacteria, Large Leukocytes, Negative Nitrites, and TNTC WBC however did show 6-30 Squamous Epithelial Cells: **Was Pan-sensitive and switched Abx to Ceftriaxone IV 2 grams** -Discussed Case with Dr. Denman George of Gyn-Onc and I personally also spoke with Dr. Alycia Rossetti (patients Primary Heme/Onc) that patient is in the hospital. (PAGER is [phone]) -Repeat CXR this AM showed no active disease as initial CXR showed mild central bronchitic changes  -Consulted Dr. Heath Lark in Hematology/Oncology for further Recc's and Management.  -Tbo-Filgrastim injection of 480 mcg given -CT Abdomen and Pelvis 12/02/15 showed Focal area of altered perfusion in the interpolar right kidney. Pyelonephritis would be a consideration. Nonobstructing left renal stones with associated scarring. Stable appearance right renal cysts. No Hydronephrosis -Follow up with Hematology/Oncology Dr. Heath Lark as an outpatient as well as Gyn-Oncology as an outpatient  Severe Sepsis 2/2 to E Coli Pyelonephritis/UTI -Sepsis Physiology Resolving as patient no  longer Hypotensive -Changed IV Abx to IV Ceftriaxone as E Coli Was Sensitive -> Switch to po KEFLEX 500 mg po BID for D/C  Ovarian Cancer on Current Chemotherapy with Taxol/Carboplatin -Last Chemotherapy Tx was 11/19/2015; Patient take Decadron with Chemotherapy -Consulted  Dr. Heath Lark for further evaluation and recommendations -Per Dr. Alvy Bimler continue supportive care and chemotherapy is not due until several weeks.   Pancytopenia, improving after Tbo-Filgrastim and Irradiated Blood -Likely 2/2 to Chemotherapy -WBC now 7.2, Hb was 8.6, and Plt was 80 -Continue to Monitor and repeat CBC as an outpatient -Follow up with Hematology/Oncology Dr. Alvy Bimler  Anemia likely from Chemotherapy s/p Transfusion of 2 Units of Irradiated pRBC -Patient's Hb/Hct went from 7.6/22.1 -> 6.8/19.8 -> 8.9/25.8 -> 8.6/24.6 -Transfused 2 unit of Irradiated pRBC -Repeat CBC as an outpatient  Hypokalemia -Patient's K+ was 3.3 this AM -Repleted po Kcl 40 mEQ x2 -Repeat BMP as an outpatient and Continue home Mag Supplmentation  Hypomagnesemia  -Patient's Mag Level was 1.0 today -Repleted with 4 grams of IV Mag Sulfate -Continue with Mag Oxide as an Outpatient and have Mag Level repeated  Hyponatremia, improved -Patient's improved to 137  CAD s/p 2 stents and MI -C/w ASA 81 mg, Prasugrel 10 mg tablets, Atorvastatin 40 mg po Daily, Metoprolol Succinate 100 mg po Daily wb and Losartan 100 mg po Daily  Hypertension -C/w Metoprolol Succinate 100 mg po Daily wb and with Losartan 100 mg po Daily  ADD -Held patient's Home Adderall while in the hospital -Resume at D/C  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 Alvy Bimler  -Will Continue at Discharge per Dr. Calton Dach recommedndations  Discharge Instructions  Discharge Instructions    Call MD for:  difficulty breathing, headache or visual disturbances    Complete by:  As directed    Call MD for:  extreme fatigue    Complete by:  As directed    Call MD for:  persistant dizziness or light-headedness    Complete by:  As directed    Call MD for:  persistant nausea and vomiting    Complete by:  As directed    Diet - low sodium heart healthy    Complete by:  As directed    Discharge  instructions    Complete by:  As directed    Follow up with [place] PCP and with Dr. Alvy Bimler in Hematology/Oncology.   Increase activity slowly    Complete by:  As directed        Medication List    STOP taking these medications   clonazePAM 1 MG tablet Commonly known as:  KLONOPIN   promethazine 25 MG tablet Commonly known as:  PHENERGAN   Zinc Oxide 40 % Pste Commonly known as:  DESITIN     TAKE these medications   acetaminophen 500 MG tablet Commonly known as:  TYLENOL Take 1,000 mg by mouth every 6 (six) hours as needed for mild pain, moderate pain, fever or headache.   albuterol 108 (90 Base) MCG/ACT inhaler Commonly known as:  PROVENTIL HFA;VENTOLIN HFA Inhale 2 puffs into the lungs every 6 (six) hours as needed for wheezing or shortness of breath.   ALPRAZolam 1 MG tablet Commonly known as:  XANAX Take 1 mg by mouth 3 (three) times daily as needed for anxiety.   amphetamine-dextroamphetamine 30 MG 24 hr capsule Commonly known as:  ADDERALL XR Take 30 mg by mouth daily with breakfast. Only takes when she is at school  amphetamine-dextroamphetamine 20 MG tablet Commonly known as:  ADDERALL Take 20 mg by mouth daily with lunch. Only takes when she is at school   aspirin 81 MG chewable tablet Chew 81 mg by mouth daily with breakfast.   atorvastatin 40 MG tablet Commonly known as:  LIPITOR Take 40 mg by mouth at bedtime.   b complex vitamins tablet Take 1 tablet by mouth daily.   cephALEXin 500 MG capsule Commonly known as:  KEFLEX Take 1 capsule (500 mg total) by mouth 2 (two) times daily. Start taking on:  12/03/2015   cetirizine 10 MG tablet Commonly known as:  ZYRTEC Take 10 mg by mouth daily with breakfast.   dexamethasone 4 MG tablet Commonly known as:  DECADRON Take 8 mg by mouth daily with breakfast. Pt only takes this medication on days 2,3, and 4 of chemo.   FISH OIL PO Take 1 capsule by mouth daily.   HYDROcodone-homatropine 5-1.5  MG/5ML syrup Commonly known as:  HYCODAN Take 5 mLs by mouth every 6 (six) hours as needed for cough.   ibuprofen 200 MG tablet Commonly known as:  ADVIL,MOTRIN Take 400 mg by mouth every 6 (six) hours as needed for fever, headache, mild pain, moderate pain or cramping.   losartan 50 MG tablet Commonly known as:  COZAAR Take 100 mg by mouth daily with breakfast.   MAGNESIUM-OXIDE 400 (241.3 Mg) MG tablet Generic drug:  magnesium oxide Take 400 mg by mouth 2 (two) times daily.   metoprolol succinate 100 MG 24 hr tablet Commonly known as:  TOPROL-XL Take 100 mg by mouth daily with breakfast. Take with or immediately following a meal.   multivitamin with minerals Tabs tablet Take 1 tablet by mouth daily with breakfast.   nitroGLYCERIN 0.4 MG SL tablet Commonly known as:  NITROSTAT Place 0.4 mg under the tongue every 5 (five) minutes as needed for chest pain.   oxyCODONE 5 MG immediate release tablet Commonly known as:  Oxy IR/ROXICODONE Take 5 mg by mouth every 6 (six) hours as needed for severe pain (Only takes during chemo).   potassium chloride SA 20 MEQ tablet Commonly known as:  K-DUR,KLOR-CON Take 20 mEq by mouth daily with breakfast.   prasugrel 10 MG Tabs tablet Commonly known as:  EFFIENT Take 10 mg by mouth daily with breakfast.   pregabalin 25 MG capsule Commonly known as:  LYRICA Take 1 capsule (25 mg total) by mouth 2 (two) times daily.   venlafaxine XR 150 MG 24 hr capsule Commonly known as:  EFFEXOR-XR Take 150 mg by mouth daily with breakfast.      Follow-up Information    Heath Lark, MD Follow up in 1 week(s).   Specialty:  Hematology and Oncology Why:  Hematology/Oncology to call to [place] appointment Contact information: [place] [address] [phone]          Allergies  Allergen Reactions  . Lisinopril Cough    Consultations:  Hematology/Oncology  Procedures/Studies: Dg Chest 2 View  Result Date:  11/28/2015 CLINICAL DATA:  Fever, sore throat, cough and chills. Myalgias. History of ovarian cancer. EXAM: CHEST  2 VIEW COMPARISON:  None. FINDINGS: Right IJ port catheter tip mid SVC level. Normal heart size and vascularity. No focal pneumonia, collapse or consolidation. Negative for edema, effusion or pneumothorax. Minor central bronchitic change. Trachea is midline. No significant osseous finding. Coronary stents present. IMPRESSION: Mild central bronchitic change.  No superimposed acute process. Electronically Signed   By: Jerilynn Mages.  Shick M.D.   On: 11/28/2015 16:40   Ct Head Wo Contrast  Result Date: 11/28/2015 CLINICAL DATA:  35 y/o F; dizziness with severe headache. Patient undergoing chemotherapy for ovarian cancer. EXAM: CT HEAD WITHOUT CONTRAST TECHNIQUE: Contiguous axial images were obtained from the base of the skull through the vertex without intravenous contrast. COMPARISON:  None available. FINDINGS: Brain: No evidence of acute infarction, hemorrhage, hydrocephalus, extra-axial collection or mass lesion/mass effect. Vascular: No hyperdense vessel or unexpected calcification. Skull: Normal. Negative for fracture or focal lesion. Sinuses/Orbits: No acute finding. Other: None. IMPRESSION: No acute intracranial abnormality identified. Unremarkable CT of the head for age. Electronically Signed   By: Kristine Garbe M.D.   On: 11/28/2015 17:05   Ct Abdomen Pelvis W Contrast  Result Date: 12/02/2015 CLINICAL DATA:  Ovarian cancer. Increased weakness. Nectar and fever. EXAM: CT ABDOMEN AND PELVIS WITH CONTRAST TECHNIQUE: Multidetector CT imaging of the abdomen and pelvis was performed using the standard protocol following bolus administration of intravenous contrast. CONTRAST:  125mL ISOVUE-300 IOPAMIDOL (ISOVUE-300) INJECTION 61%, 43mL ISOVUE-300 IOPAMIDOL (ISOVUE-300) INJECTION 61% COMPARISON:  06/19/2014 FINDINGS: Lower chest:  Unremarkable. Hepatobiliary: No focal abnormality within the  liver parenchyma. There is no evidence for gallstones, gallbladder wall thickening, or pericholecystic fluid. No intrahepatic or extrahepatic biliary dilation. Pancreas: No focal mass lesion. No dilatation of the main duct. No intraparenchymal cyst. No peripancreatic edema. Spleen: No splenomegaly. No focal mass lesion. Adrenals/Urinary Tract: Multiple areas of scarring are noted in the left kidney with multiple nonobstructing left renal stones, as before. Dominant stone left kidney is an interpolar posterior 6 mm stone. 2.1 cm cyst upper pole right kidney was 1.7 cm previously. 11 mm cyst identified lower pole right kidney subtle area of hypoperfusion is identified in the interpolar right kidney on today's study, nonspecific, but does raise the question of pyelonephritis. No evidence for hydronephrosis. The urinary bladder appears normal for the degree of distention. Stomach/Bowel: Stomach is nondistended. No gastric wall thickening. No evidence of outlet obstruction. Duodenum is normally positioned as is the ligament of Treitz. No small bowel wall thickening. No small bowel dilatation. The terminal ileum is normal. The appendix is normal. No gross colonic mass. No colonic wall thickening. No substantial diverticular change. Vascular/Lymphatic: There is abdominal aortic atherosclerosis without aneurysm. There is no gastrohepatic or hepatoduodenal ligament lymphadenopathy. No intraperitoneal or retroperitoneal lymphadenopathy. No pelvic sidewall lymphadenopathy. Reproductive: Uterus surgically absent.  There is no adnexal mass. Other: No intraperitoneal free fluid. Musculoskeletal: Right paraumbilical hernia contains a loop of transverse colon without complicating features. Bone windows reveal no worrisome lytic or sclerotic osseous lesions. IMPRESSION: 1. Focal area of altered perfusion in the interpolar right kidney. Pyelonephritis would be a consideration. 2. Nonobstructing left renal stones with associated  scarring. 3. Stable appearance right renal cysts. Electronically Signed   By: Misty Stanley M.D.   On: 12/02/2015 15:43   Dg Chest Port 1 View  Result Date: 11/30/2015 CLINICAL DATA:  Low blood pressure. EXAM: PORTABLE CHEST 1 VIEW COMPARISON:  November 28, 2015 FINDINGS: Stable right Port-A-Cath. No other interval changes or acute abnormalities. IMPRESSION: No active disease. Electronically Signed   By: Dorise Bullion III M.D   On: 11/30/2015 10:56    Subjective: Seen and examined at bedside and was doing extremely well. Had no active complaints and was ready to go home.   Discharge Exam: Vitals:   12/02/15 0550 12/02/15 1400  BP: 106/72 102/76  Pulse: 81 76  Resp: 16 16  Temp: 98.4  F (36.9 C) 98.2 F (36.8 C)   Vitals:   12/01/15 1358 12/01/15 2209 12/02/15 0550 12/02/15 1400  BP: 113/70 113/80 106/72 102/76  Pulse:  77 81 76  Resp: 18 16 16 16   Temp: 99 F (37.2 C) 98.1 F (36.7 C) 98.4 F (36.9 C) 98.2 F (36.8 C)  TempSrc: Oral Oral Oral Oral  SpO2: 100% 98% 97% 96%  Weight:   91.5 kg (201 lb 12.8 oz)   Height:       General: Pt is alert, awake, not in acute distress Cardiovascular: RRR, S1/S2 +, no rubs, no gallops Respiratory: CTA bilaterally, no wheezing, no rhonchi Abdominal: Soft, NT, ND, bowel sounds + Extremities: no edema, no cyanosis  The results of significant diagnostics from this hospitalization (including imaging, microbiology, ancillary and laboratory) are listed below for reference.    Microbiology: Recent Results (from the past 240 hour(s))  Culture, Blood     Status: None (Preliminary result)   Collection Time: 11/28/15  2:24 PM  Result Value Ref Range Status   BLOOD CULTURE, ROUTINE Preliminary report  Preliminary   RESULT 1 Comment  Preliminary    Comment: No growth detected at this time.  Culture, Blood     Status: None (Preliminary result)   Collection Time: 11/28/15  2:25 PM  Result Value Ref Range Status   BLOOD CULTURE, ROUTINE  Preliminary report  Preliminary   RESULT 1 Comment  Preliminary    Comment: No growth detected at this time.  Urine culture     Status: Abnormal   Collection Time: 11/28/15  5:14 PM  Result Value Ref Range Status   Specimen Description URINE, CLEAN CATCH  Final   Special Requests NONE  Final   Culture >=100,000 COLONIES/mL ESCHERICHIA COLI (A)  Final   Report Status 12/01/2015 FINAL  Final   Organism ID, Bacteria ESCHERICHIA COLI (A)  Final      Susceptibility   Escherichia coli - MIC*    AMPICILLIN 4 SENSITIVE Sensitive     CEFAZOLIN <=4 SENSITIVE Sensitive     CEFTRIAXONE <=1 SENSITIVE Sensitive     CIPROFLOXACIN <=0.25 SENSITIVE Sensitive     GENTAMICIN <=1 SENSITIVE Sensitive     IMIPENEM <=0.25 SENSITIVE Sensitive     NITROFURANTOIN <=16 SENSITIVE Sensitive     TRIMETH/SULFA <=20 SENSITIVE Sensitive     AMPICILLIN/SULBACTAM <=2 SENSITIVE Sensitive     PIP/TAZO <=4 SENSITIVE Sensitive     Extended ESBL NEGATIVE Sensitive     * >=100,000 COLONIES/mL ESCHERICHIA COLI  MRSA PCR Screening     Status: None   Collection Time: 11/28/15  8:00 PM  Result Value Ref Range Status   MRSA by PCR NEGATIVE NEGATIVE Final    Comment:        The GeneXpert MRSA Assay (FDA approved for NASAL specimens only), is one component of a comprehensive MRSA colonization surveillance program. It is not intended to diagnose MRSA infection nor to guide or monitor treatment for MRSA infections.   Culture, blood (Routine X 2) w Reflex to ID Panel     Status: None (Preliminary result)   Collection Time: 11/29/15  3:12 PM  Result Value Ref Range Status   Specimen Description BLOOD LEFT ARM  Final   Special Requests IN PEDIATRIC BOTTLE 3CC  Final   Culture   Final    NO GROWTH 3 DAYS Performed at [facility]    Report Status PENDING  Incomplete  Culture, blood (Routine X 2) w Reflex to  ID Panel     Status: None (Preliminary result)   Collection Time: 11/29/15  3:12 PM  Result Value Ref  Range Status   Specimen Description BLOOD RIGHT ARM  Final   Special Requests BOTTLES DRAWN AEROBIC AND ANAEROBIC 5CC  Final   Culture   Final    NO GROWTH 3 DAYS Performed at [facility]    Report Status PENDING  Incomplete    Labs: BNP (last 3 results) No results for input(s): BNP in the last 8760 hours. Basic Metabolic Panel:  Recent Labs Lab 11/28/15 1207 11/29/15 0345 11/30/15 0444 12/01/15 0917 12/02/15 0303  NA 133* 131* 134* 135 137  K 3.5 3.2* 2.8* 3.2* 3.3*  CL  --  105 103 104 101  CO2 19* 19* 25 24 27   GLUCOSE 141* 120* 153* 130* 109*  BUN 12.1 10 7 8 10   CREATININE 0.7 0.75 0.53 0.53 0.53  CALCIUM 8.9 6.9* 7.5* 7.9* 8.7*  MG  --   --  1.2*  --  1.0*  PHOS  --   --  2.6  --  5.0*   Liver Function Tests:  Recent Labs Lab 11/28/15 1207 11/29/15 0345 11/30/15 0444 12/01/15 0917 12/02/15 0303  AST 20 43* 24 26 27   ALT 26 38 31 35 39  ALKPHOS 93 60 60 62 70  BILITOT 0.68 0.6 0.4 1.1 0.7  PROT 7.4 6.2* 6.2* 6.2* 6.5  ALBUMIN 3.5 3.1* 3.1* 3.2* 3.1*   No results for input(s): LIPASE, AMYLASE in the last 168 hours. No results for input(s): AMMONIA in the last 168 hours. CBC:  Recent Labs Lab 11/28/15 1207  11/29/15 1311 11/30/15 0444 11/30/15 1700 12/01/15 0339 12/02/15 0303  WBC 1.8*  < > 1.5* 1.4* 1.4* 4.0 7.2  NEUTROABS 0.9*  --  0.5* 0.3*  --  2.6 5.6  HGB 8.8*  < > 7.6* 6.8* 7.8* 8.9* 8.6*  HCT 26.0*  < > 22.1* 19.8* 22.4* 25.8* 24.6*  MCV 97.7  < > 97.4 96.6 95.3 91.8 93.5  PLT 65*  < > 57* 66* 73* 70* 80*  < > = values in this interval not displayed. Cardiac Enzymes: No results for input(s): CKTOTAL, CKMB, CKMBINDEX, TROPONINI in the last 168 hours. BNP: Invalid input(s): POCBNP CBG: No results for input(s): GLUCAP in the last 168 hours. D-Dimer No results for input(s): DDIMER in the last 72 hours. Hgb A1c No results for input(s): HGBA1C in the last 72 hours. Lipid Profile No results for input(s): CHOL, HDL, LDLCALC,  TRIG, CHOLHDL, LDLDIRECT in the last 72 hours. Thyroid function studies No results for input(s): TSH, T4TOTAL, T3FREE, THYROIDAB in the last 72 hours.  Invalid input(s): FREET3 Anemia work up No results for input(s): VITAMINB12, FOLATE, FERRITIN, TIBC, IRON, RETICCTPCT in the last 72 hours. Urinalysis    Component Value Date/Time   COLORURINE AMBER (A) 11/28/2015 1714   APPEARANCEUR TURBID (A) 11/28/2015 1714   LABSPEC 1.027 11/28/2015 1714   LABSPEC 1.015 10/31/2014 1047   PHURINE 6.0 11/28/2015 1714   GLUCOSEU NEGATIVE 11/28/2015 1714   GLUCOSEU Negative 10/31/2014 1047   HGBUR TRACE (A) 11/28/2015 1714   BILIRUBINUR NEGATIVE 11/28/2015 1714   BILIRUBINUR Negative 10/31/2014 1047   KETONESUR NEGATIVE 11/28/2015 1714   PROTEINUR 100 (A) 11/28/2015 1714   UROBILINOGEN 0.2 10/31/2014 1047   NITRITE NEGATIVE 11/28/2015 1714   LEUKOCYTESUR LARGE (A) 11/28/2015 1714   LEUKOCYTESUR Negative 10/31/2014 1047   Sepsis Labs Invalid input(s): PROCALCITONIN,  WBC,  LACTICIDVEN Microbiology  Recent Results (from the past 240 hour(s))  Culture, Blood     Status: None (Preliminary result)   Collection Time: 11/28/15  2:24 PM  Result Value Ref Range Status   BLOOD CULTURE, ROUTINE Preliminary report  Preliminary   RESULT 1 Comment  Preliminary    Comment: No growth detected at this time.  Culture, Blood     Status: None (Preliminary result)   Collection Time: 11/28/15  2:25 PM  Result Value Ref Range Status   BLOOD CULTURE, ROUTINE Preliminary report  Preliminary   RESULT 1 Comment  Preliminary    Comment: No growth detected at this time.  Urine culture     Status: Abnormal   Collection Time: 11/28/15  5:14 PM  Result Value Ref Range Status   Specimen Description URINE, CLEAN CATCH  Final   Special Requests NONE  Final   Culture >=100,000 COLONIES/mL ESCHERICHIA COLI (A)  Final   Report Status 12/01/2015 FINAL  Final   Organism ID, Bacteria ESCHERICHIA COLI (A)  Final       Susceptibility   Escherichia coli - MIC*    AMPICILLIN 4 SENSITIVE Sensitive     CEFAZOLIN <=4 SENSITIVE Sensitive     CEFTRIAXONE <=1 SENSITIVE Sensitive     CIPROFLOXACIN <=0.25 SENSITIVE Sensitive     GENTAMICIN <=1 SENSITIVE Sensitive     IMIPENEM <=0.25 SENSITIVE Sensitive     NITROFURANTOIN <=16 SENSITIVE Sensitive     TRIMETH/SULFA <=20 SENSITIVE Sensitive     AMPICILLIN/SULBACTAM <=2 SENSITIVE Sensitive     PIP/TAZO <=4 SENSITIVE Sensitive     Extended ESBL NEGATIVE Sensitive     * >=100,000 COLONIES/mL ESCHERICHIA COLI  MRSA PCR Screening     Status: None   Collection Time: 11/28/15  8:00 PM  Result Value Ref Range Status   MRSA by PCR NEGATIVE NEGATIVE Final    Comment:        The GeneXpert MRSA Assay (FDA approved for NASAL specimens only), is one component of a comprehensive MRSA colonization surveillance program. It is not intended to diagnose MRSA infection nor to guide or monitor treatment for MRSA infections.   Culture, blood (Routine X 2) w Reflex to ID Panel     Status: None (Preliminary result)   Collection Time: 11/29/15  3:12 PM  Result Value Ref Range Status   Specimen Description BLOOD LEFT ARM  Final   Special Requests IN PEDIATRIC BOTTLE 3CC  Final   Culture   Final    NO GROWTH 3 DAYS Performed at [facility]    Report Status PENDING  Incomplete  Culture, blood (Routine X 2) w Reflex to ID Panel     Status: None (Preliminary result)   Collection Time: 11/29/15  3:12 PM  Result Value Ref Range Status   Specimen Description BLOOD RIGHT ARM  Final   Special Requests BOTTLES DRAWN AEROBIC AND ANAEROBIC 5CC  Final   Culture   Final    NO GROWTH 3 DAYS Performed at [facility]    Report Status PENDING  Incomplete   Time coordinating discharge: Over 30 minutes  SIGNED:  Kerney Elbe, DO [facility] 12/02/2015, 4:30 PM Pager [phone]  If 7PM-7AM, please contact night-coverage [email or web address] Password [email or web address]

## 2015-12-02 NOTE — Care Management Note (Signed)
Case Management Note  Patient Details  Name: Shelley Vega MRN: [id number] Date of Birth: Oct 04, 1980  Subjective/Objective:  35 y/o f admitted w/Severe sepsis. From home.PT-no f/u.                  Action/Plan:d/c plan home.   Expected Discharge Date:   (unknown)               Expected Discharge Plan:     In-House Referral:     Discharge planning Services  CM Consult  Post Acute Care Choice:    Choice offered to:     DME Arranged:    DME Agency:     HH Arranged:    HH Agency:     Status of Service:  In process, will continue to follow  If discussed at Long Length of Stay Meetings, dates discussed:    Additional Comments:  Dessa Phi, RN 12/02/2015, 3:35 PM

## 2015-12-02 NOTE — Telephone Encounter (Signed)
Lft vm w/appt date and time for the pt.

## 2015-12-02 NOTE — Progress Notes (Signed)
Shelley Vega   DOB:11-18-1980   [id number]    Subjective: She feels better today. She has been afebrile and denies dizziness or chest pain. She is wondering the cause of her sepsis. Her neuropathy is stable  Objective:  Vitals:   12/01/15 2209 12/02/15 0550  BP: 113/80 106/72  Pulse: 77 81  Resp: 16 16  Temp: 98.1 F (36.7 C) 98.4 F (36.9 C)     Intake/Output Summary (Last 24 hours) at 12/02/15 [id number] Last data filed at 12/02/15 [id number]  Gross per 24 hour  Intake             1630 ml  Output             2050 ml  Net             -420 ml    GENERAL:alert, no distress and comfortable SKIN: skin color, texture, turgor are normal, no rashes or significant lesions EYES: normal, Conjunctiva are pink and non-injected, sclera clear Musculoskeletal:no cyanosis of digits and no clubbing  NEURO: alert & oriented x 3 with fluent speech, no focal motor/sensory deficits   Labs:  Lab Results  Component Value Date   WBC 7.2 12/02/2015   HGB 8.6 (L) 12/02/2015   HCT 24.6 (L) 12/02/2015   MCV 93.5 12/02/2015   PLT 80 (L) 12/02/2015   NEUTROABS 5.6 12/02/2015    Lab Results  Component Value Date   NA 137 12/02/2015   K 3.3 (L) 12/02/2015   CL 101 12/02/2015   CO2 27 12/02/2015    Studies:  Dg Chest Port 1 View  Result Date: 11/30/2015 CLINICAL DATA:  Low blood pressure. EXAM: PORTABLE CHEST 1 VIEW COMPARISON:  November 28, 2015 FINDINGS: Stable right Port-A-Cath. No other interval changes or acute abnormalities. IMPRESSION: No active disease. Electronically Signed   By: Dorise Bullion III M.D   On: 11/30/2015 10:56    Assessment & Plan:   Neutropenic sepsis secondary to Escherichia coli Cultures are pending but preliminary suggested Escherichia coli sepsis Continue broad-spectrum IV antibiotics; possibly can transition to oral antibiotics upon discharge She has responded well to G-CSF injection with resolution of neutropenia and fever I'm concerned as to the cause of her  Escherichia coli sepsis. She had extensive pelvic surgery and that could cause possible ureteric stricture. Recurrence of cancer causing hydronephrosis can also predispose her to risk of recurrent sepsis I recommend CT scan of the abdomen and pelvis with oral and IV contrast and she agreed to proceed  Severe anemia secondary to chemotherapy Recommend blood transfusion support to keep hemoglobin greater than 8 in view of prior history of heart attack  Thrombocytopenia Secondary to chemotherapy There is no contraindication to remain on aspirin or LMWH as long as the platelet is greater than 50,000. 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 The risk and benefits of Lyrica, is discussed and she agreed to proceed Plan to stay on low dose for now with plan for titration in the outpatient  Mild protein calorie malnutrition Likely due to sepsis Continue nutritional intake as tolerated  Recurrent ovarian cancer Continue supportive care for now She is not due for chemotherapy until next week. I will proceed with CT scan as discussed above. I recommend consideration of holding chemotherapy until after Thanksgiving to allow adequate recovery from recent sepsis. I will touch base with her primary oncologist later  CODE  STATUS Full code  Discharge planning The patient requested me to see her in the future for supportive care  I will touch base with her primary GYN oncologist today I suspect if she continues to improve clinically, she would be ready for discharge later today with plan to transition her to oral antibiotics in the future I will return today for final recommendation Heath Lark, MD 12/02/2015  9:39 AM

## 2015-12-03 ENCOUNTER — Telehealth: Payer: Self-pay | Admitting: Gynecologic Oncology

## 2015-12-03 NOTE — Telephone Encounter (Signed)
"  Doing so much better."  No fever reported.  Discussed CT scan results.  Appt with Dr. Alvy Bimler on Monday.  Advised to call for any needs or concerns.

## 2015-12-04 ENCOUNTER — Encounter (HOSPITAL_COMMUNITY): Payer: Self-pay

## 2015-12-04 DIAGNOSIS — I252 Old myocardial infarction: Secondary | ICD-10-CM | POA: Insufficient documentation

## 2015-12-04 DIAGNOSIS — R300 Dysuria: Secondary | ICD-10-CM | POA: Diagnosis not present

## 2015-12-04 DIAGNOSIS — Z8543 Personal history of malignant neoplasm of ovary: Secondary | ICD-10-CM | POA: Insufficient documentation

## 2015-12-04 DIAGNOSIS — R509 Fever, unspecified: Secondary | ICD-10-CM | POA: Diagnosis present

## 2015-12-04 DIAGNOSIS — F909 Attention-deficit hyperactivity disorder, unspecified type: Secondary | ICD-10-CM | POA: Diagnosis not present

## 2015-12-04 DIAGNOSIS — I2510 Atherosclerotic heart disease of native coronary artery without angina pectoris: Secondary | ICD-10-CM | POA: Insufficient documentation

## 2015-12-04 DIAGNOSIS — Z79899 Other long term (current) drug therapy: Secondary | ICD-10-CM | POA: Insufficient documentation

## 2015-12-04 DIAGNOSIS — Z87891 Personal history of nicotine dependence: Secondary | ICD-10-CM | POA: Insufficient documentation

## 2015-12-04 DIAGNOSIS — Z7982 Long term (current) use of aspirin: Secondary | ICD-10-CM | POA: Insufficient documentation

## 2015-12-04 DIAGNOSIS — Z955 Presence of coronary angioplasty implant and graft: Secondary | ICD-10-CM | POA: Diagnosis not present

## 2015-12-04 LAB — CULTURE, BLOOD (ROUTINE X 2)
CULTURE: NO GROWTH
Culture: NO GROWTH

## 2015-12-04 LAB — CULTURE, BLOOD (SINGLE)

## 2015-12-04 NOTE — ED Triage Notes (Signed)
Pt was discharged from here on Monday, she was on antibiotics for a UTI while she was here and sent home with it, she is still having UTI symptoms and today she's had a fever, she took tylenol about 2000 tonight before coming in

## 2015-12-05 ENCOUNTER — Emergency Department (HOSPITAL_COMMUNITY)
Admission: EM | Admit: 2015-12-05 | Discharge: 2015-12-05 | Disposition: A | Discharge status: Home / Self Care | Payer: BC Managed Care – PPO | Attending: Emergency Medicine | Admitting: Emergency Medicine

## 2015-12-05 ENCOUNTER — Other Ambulatory Visit: Payer: BC Managed Care – PPO

## 2015-12-05 DIAGNOSIS — R300 Dysuria: Secondary | ICD-10-CM

## 2015-12-05 LAB — URINALYSIS, ROUTINE W REFLEX MICROSCOPIC
Bilirubin Urine: NEGATIVE
GLUCOSE, UA: NEGATIVE mg/dL
Hgb urine dipstick: NEGATIVE
Ketones, ur: NEGATIVE mg/dL
LEUKOCYTES UA: NEGATIVE
NITRITE: NEGATIVE
PH: 6.5 (ref 5.0–8.0)
Protein, ur: NEGATIVE mg/dL
SPECIFIC GRAVITY, URINE: 1.021 (ref 1.005–1.030)

## 2015-12-05 LAB — CBC WITH DIFFERENTIAL/PLATELET
BASOS PCT: 1 %
Basophils Absolute: 0 10*3/uL (ref 0.0–0.1)
EOS PCT: 0 %
Eosinophils Absolute: 0 10*3/uL (ref 0.0–0.7)
HEMATOCRIT: 28.5 % — AB (ref 36.0–46.0)
HEMOGLOBIN: 9.6 g/dL — AB (ref 12.0–15.0)
Lymphocytes Relative: 31 %
Lymphs Abs: 1.3 10*3/uL (ref 0.7–4.0)
MCH: 32.4 pg (ref 26.0–34.0)
MCHC: 33.7 g/dL (ref 30.0–36.0)
MCV: 96.3 fL (ref 78.0–100.0)
MONO ABS: 0.4 10*3/uL (ref 0.1–1.0)
MONOS PCT: 10 %
NEUTROS PCT: 58 %
Neutro Abs: 2.5 10*3/uL (ref 1.7–7.7)
Platelets: 96 10*3/uL — ABNORMAL LOW (ref 150–400)
RBC: 2.96 MIL/uL — AB (ref 3.87–5.11)
RDW: 20.4 % — ABNORMAL HIGH (ref 11.5–15.5)
WBC: 4.2 10*3/uL (ref 4.0–10.5)

## 2015-12-05 LAB — BASIC METABOLIC PANEL
ANION GAP: 9 (ref 5–15)
BUN: 17 mg/dL (ref 6–20)
CALCIUM: 9.5 mg/dL (ref 8.9–10.3)
CHLORIDE: 104 mmol/L (ref 101–111)
CO2: 27 mmol/L (ref 22–32)
Creatinine, Ser: 0.57 mg/dL (ref 0.44–1.00)
GFR calc non Af Amer: >60 mL/min (ref >60)
GLUCOSE: 118 mg/dL — AB (ref 65–99)
POTASSIUM: 3.9 mmol/L (ref 3.5–5.1)
Sodium: 140 mmol/L (ref 135–145)

## 2015-12-05 LAB — I-STAT CG4 LACTIC ACID, ED: Lactic Acid, Venous: 1.9 mmol/L (ref 0.5–1.9)

## 2015-12-05 MED ORDER — SODIUM CHLORIDE 0.9 % IV BOLUS (SEPSIS): 2000.0000 mL | Freq: Once | INTRAVENOUS | Status: DC

## 2015-12-05 MED ORDER — PHENAZOPYRIDINE HCL 200 MG PO TABS: 200.0000 mg | ORAL_TABLET | Freq: Three times a day (TID) | ORAL | 0 refills | Status: DC

## 2015-12-05 MED ORDER — SODIUM CHLORIDE 0.9 % IV BOLUS (SEPSIS)
1000.0000 mL | Freq: Once | INTRAVENOUS | Status: AC
  Administered 2015-12-05: 1000 mL via INTRAVENOUS

## 2015-12-05 NOTE — Discharge Instructions (Signed)
Take Pyridium as prescribed. Finish your for a course of antibiotics. Your urine today does not suggest infection. The rest of your lab work is also reassuring. Follow-up with your primary care doctor to ensure resolution of symptoms. Return for worsening symptoms such as fever over 100.28F, worsening pain, chills, or uncontrolled nausea or vomiting.

## 2015-12-05 NOTE — ED Notes (Signed)
Patient is alert and oriented x4.  She is complaining of a fever that started today and was as high as 100.14F.  Patient was recently D/C from this facility after being Dx with sepsis.  V/S stable and patient does not currently have a fever after taking tylenol at home.  Currently she rates her pain 3 of 10.

## 2015-12-05 NOTE — ED Provider Notes (Signed)
[facility] DEPT Provider Note   CSN: [id number] Arrival date & time: 12/04/15  2311 By signing my name below, I, Dyke Brackett, attest that this documentation has been prepared under the direction and in the presence of non-physician practitioner, Antonietta Breach, PA-C. Electronically Signed: Dyke Brackett, Scribe. 12/05/2015. 12:54 AM.   History   Chief Complaint Chief Complaint  Patient presents with  . Fever    HPI Shelley Vega is a 35 y.o. female with hx of sepsis, MI, and ovarian cancer who presents to the Emergency Department complaining of continued intermittent dysuria which began last week. She describes the dysuria as a sharp pain in her urethra. Pt was discharged on 12/02/15 after being hospitalized for sepsis secondary to a UTI and states she is still having urinary symptoms. Pt notes associated  subjective fever (tmax 100F) onset tonight, urinary frequency, and sharp pain at the end of each void. Pt has taken tylenol PTA with some relief. Pt is currently receiving chemotherapy for ovarian cancer. Last treatment 11/18/15. Abdominal SHx includes hysterectomy. She denies hematuria, nausea, vomiting, abdominal pain, hematochezia, diarrhea, or constipation.   The history is provided by the patient. No language interpreter was used.   Past Medical History:  Diagnosis Date  . Heart attack   . Ovarian cancer [facility]) 2016   right ovary    Patient Active Problem List   Diagnosis Date Noted  . Severe sepsis ([place]) 11/28/2015  . Hypotension 11/28/2015  . Neutropenia with fever ([place]) 11/28/2015  . Pyuria 11/28/2015  . Pancytopenia ([place])- due to chemoRx 11/28/2015  . Neutropenic fever ([place]) 11/28/2015  . Personal history of immunosupression therapy   . Genetic testing 08/21/2014  . Family history of breast cancer in female 08/21/2014  . Family history of cancer 08/21/2014  . CAD (coronary artery disease) - sp cor stent x 2 07/25/2014  . Dyslipidemia 07/25/2014  . Ovarian  cancer ([place]) 07/23/2014  . Post-operative state 07/15/2014  . Apnea, sleep 06/13/2013  . Acute non-ST segment elevation myocardial infarction ([place]) 05/09/2013  . ADD (attention deficit disorder) 04/25/2013  . Airway hyperreactivity 04/25/2013  . BP (high blood pressure) 04/25/2013  . Class 1 obesity 04/25/2013    Past Surgical History:  Procedure Laterality Date  . ABDOMINAL HYSTERECTOMY    . BREAST REDUCTION SURGERY    . CORONARY ANGIOPLASTY WITH STENT PLACEMENT    . MOUTH SURGERY      OB History    No data available       Home Medications    Prior to Admission medications   Medication Sig Start Date End Date Taking? Authorizing Provider  acetaminophen (TYLENOL) 500 MG tablet Take 1,000 mg by mouth every 6 (six) hours as needed for mild pain, moderate pain, fever or headache.    Historical Provider, MD  albuterol (PROVENTIL HFA;VENTOLIN HFA) 108 (90 BASE) MCG/ACT inhaler Inhale 2 puffs into the lungs every 6 (six) hours as needed for wheezing or shortness of breath.    Historical Provider, MD  ALPRAZolam Duanne Moron) 1 MG tablet Take 1 mg by mouth 3 (three) times daily as needed for anxiety.    Historical Provider, MD  amphetamine-dextroamphetamine (ADDERALL XR) 30 MG 24 hr capsule Take 30 mg by mouth daily with breakfast. Only takes when she is at school    Historical Provider, MD  amphetamine-dextroamphetamine (ADDERALL) 20 MG tablet Take 20 mg by mouth daily with lunch. Only takes when she is at school    Historical Provider, MD  aspirin 81 MG chewable  tablet Chew 81 mg by mouth daily with breakfast.     Historical Provider, MD  atorvastatin (LIPITOR) 40 MG tablet Take 40 mg by mouth at bedtime.     Historical Provider, MD  b complex vitamins tablet Take 1 tablet by mouth daily.    Historical Provider, MD  cephALEXin (KEFLEX) 500 MG capsule Take 1 capsule (500 mg total) by mouth 2 (two) times daily. 12/03/15   Kerney Elbe, DO  cetirizine (ZYRTEC) 10 MG tablet Take 10 mg by  mouth daily with breakfast.     Historical Provider, MD  dexamethasone (DECADRON) 4 MG tablet Take 8 mg by mouth daily with breakfast. Pt only takes this medication on days 2,3, and 4 of chemo.    Historical Provider, MD  HYDROcodone-homatropine (HYCODAN) 5-1.5 MG/5ML syrup Take 5 mLs by mouth every 6 (six) hours as needed for cough. Patient not taking: Reported on 11/28/2015 11/28/15   Dorothyann Gibbs, NP  ibuprofen (ADVIL,MOTRIN) 200 MG tablet Take 400 mg by mouth every 6 (six) hours as needed for fever, headache, mild pain, moderate pain or cramping.    Historical Provider, MD  losartan (COZAAR) 50 MG tablet Take 100 mg by mouth daily with breakfast.     Historical Provider, MD  magnesium oxide (MAGNESIUM-OXIDE) 400 (241.3 Mg) MG tablet Take 400 mg by mouth 2 (two) times daily.    Historical Provider, MD  metoprolol succinate (TOPROL-XL) 100 MG 24 hr tablet Take 100 mg by mouth daily with breakfast. Take with or immediately following a meal.     Historical Provider, MD  Multiple Vitamin (MULTIVITAMIN WITH MINERALS) TABS tablet Take 1 tablet by mouth daily with breakfast.     Historical Provider, MD  nitroGLYCERIN (NITROSTAT) 0.4 MG SL tablet Place 0.4 mg under the tongue every 5 (five) minutes as needed for chest pain.    Historical Provider, MD  Omega-3 Fatty Acids (FISH OIL PO) Take 1 capsule by mouth daily.    Historical Provider, MD  oxyCODONE (OXY IR/ROXICODONE) 5 MG immediate release tablet Take 5 mg by mouth every 6 (six) hours as needed for severe pain (Only takes during chemo).     Historical Provider, MD  phenazopyridine (PYRIDIUM) 200 MG tablet Take 1 tablet (200 mg total) by mouth 3 (three) times daily. 12/05/15   Antonietta Breach, PA-C  potassium chloride SA (K-DUR,KLOR-CON) 20 MEQ tablet Take 20 mEq by mouth daily with breakfast.    Historical Provider, MD  prasugrel (EFFIENT) 10 MG TABS tablet Take 10 mg by mouth daily with breakfast.     Historical Provider, MD  pregabalin (LYRICA) 25 MG  capsule Take 1 capsule (25 mg total) by mouth 2 (two) times daily. 12/02/15   Kerney Elbe, DO  venlafaxine XR (EFFEXOR-XR) 150 MG 24 hr capsule Take 150 mg by mouth daily with breakfast.  10/21/15 10/20/16  Historical Provider, MD    Family History Family History  Problem Relation Age of Onset  . Colon polyps Father     about 2-3 removed following colonoscopy  . Irritable bowel syndrome Brother     1 previous colonoscopy - no polyps found  . Breast cancer Paternal Aunt 72    "low level" breast cancer  . Prostate cancer Maternal Grandfather 78  . Lymphoma Maternal Grandfather 71    large B-cell lymphoma  . Heart Problems Paternal Grandmother   . Heart attack Paternal Grandfather   . Cancer Other 20    unknown type    Social History  Social History  Substance Use Topics  . Smoking status: Former Smoker    Packs/day: 1.00    Years: 8.00    Types: Cigarettes    Quit date: 08/19/2011  . Smokeless tobacco: Never Used  . Alcohol use 0.0 oz/week     Comment: soc - maybe 3 drinks per month     Allergies   Lisinopril  Review of Systems Review of Systems 10 systems reviewed and all are negative for acute change except as noted in the HPI.   Physical Exam Updated Vital Signs BP 118/70 (BP Location: Left Arm)   Pulse 81   Temp 98.3 F (36.8 C) (Oral)   Resp 20   Wt 91.7 kg   LMP 08/27/2014   SpO2 99%   BMI 35.80 kg/m   Physical Exam  Constitutional: She is oriented to person, place, and time. She appears well-developed and well-nourished. No distress.  Pleasant and nontoxic appearing  HENT:  Head: Normocephalic and atraumatic.  Eyes: Conjunctivae and EOM are normal. No scleral icterus.  Neck: Normal range of motion.  Cardiovascular: Normal rate, regular rhythm and intact distal pulses.   Pulmonary/Chest: Effort normal. No respiratory distress. She has no wheezes. She has no rales.  Respirations even and unlabored. Lungs clear bilaterally.  Abdominal: Soft. She  exhibits no distension and no mass. There is no tenderness. There is no guarding.  Soft, obese abdomen. There is no focal tenderness. No masses or peritoneal signs.  Musculoskeletal: Normal range of motion.  Neurological: She is alert and oriented to person, place, and time. She exhibits normal muscle tone. Coordination normal.  Skin: Skin is warm and dry. No rash noted. She is not diaphoretic. No erythema. No pallor.  Psychiatric: She has a normal mood and affect. Her behavior is normal.  Nursing note and vitals reviewed.   ED Treatments / Results  DIAGNOSTIC STUDIES:  Oxygen Saturation is 98% on RA, normal by my interpretation.    COORDINATION OF CARE:  12:47 AM Discussed treatment plan with pt at bedside and pt agreed to plan.  Labs (all labs ordered are listed, but only abnormal results are displayed) Labs Reviewed  CBC WITH DIFFERENTIAL/PLATELET - Abnormal; Notable for the following:       Result Value   RBC 2.96 (*)    Hemoglobin 9.6 (*)    HCT 28.5 (*)    RDW 20.4 (*)    Platelets 96 (*)    All other components within normal limits  BASIC METABOLIC PANEL - Abnormal; Notable for the following:    Glucose, Bld 118 (*)    All other components within normal limits  URINE CULTURE  CULTURE, BLOOD (ROUTINE X 2)  CULTURE, BLOOD (ROUTINE X 2)  URINALYSIS, ROUTINE W REFLEX MICROSCOPIC (NOT AT [facility])  I-STAT CG4 LACTIC ACID, ED    EKG  EKG Interpretation None       Radiology No results found.  Procedures Procedures (including critical care time)  Medications Ordered in ED Medications  sodium chloride 0.9 % bolus 1,000 mL (0 mLs Intravenous Stopped 12/05/15 0307)     Initial Impression / Assessment and Plan / ED Course  I have reviewed the triage vital signs and the nursing notes.  Pertinent labs & imaging results that were available during my care of the patient were reviewed by me and considered in my medical decision making (see chart for details).  Clinical  Course     35 year old female, currently undergoing chemotherapy for ovarian cancer, presents to the  emergency department for persistent dysuria. Patient was admitted on 11/28/2015 for sepsis secondary to urinary tract infection. Patient with pansensitive Escherichia coli UTI. Patient was discharged on Keflex. She reports compliance with this antibiotic.  Patient is afebrile in the emergency department. She has no SIRS or sepsis criteria; no tachycardia, tachypnea, hypotension, or leukocytosis. Urinalysis is reassuring, suggesting complete resolution of the patient's infection. Urine culture has been sent as well as blood cultures in light of patient's medical history. Abdomen soft and nontender on exam.  Given hemodynamic stability and reassuring workup, patient's symptoms appear consistent with urethritis. Plan to manage with outpatient Pyridium. I see no indication to change the patient's antibiotic. She has been told to complete the full course of this medication. Primary care up advised and return precautions given. Patient discharged in stable condition with no unaddressed concerns.   Final Clinical Impressions(s) / ED Diagnoses   Final diagnoses:  Dysuria    New Prescriptions New Prescriptions   PHENAZOPYRIDINE (PYRIDIUM) 200 MG TABLET    Take 1 tablet (200 mg total) by mouth 3 (three) times daily.   I personally performed the services described in this documentation, which was scribed in my presence. The recorded information has been reviewed and is accurate.      Antonietta Breach, PA-C 12/05/15 [place], MD 12/05/15 [phone]

## 2015-12-06 ENCOUNTER — Other Ambulatory Visit: Payer: Self-pay | Admitting: Hematology and Oncology

## 2015-12-06 DIAGNOSIS — C569 Malignant neoplasm of unspecified ovary: Secondary | ICD-10-CM

## 2015-12-06 LAB — URINE CULTURE: CULTURE: NO GROWTH

## 2015-12-09 ENCOUNTER — Telehealth: Payer: Self-pay | Admitting: Hematology and Oncology

## 2015-12-09 ENCOUNTER — Ambulatory Visit (HOSPITAL_BASED_OUTPATIENT_CLINIC_OR_DEPARTMENT_OTHER): Payer: BC Managed Care – PPO | Admitting: Hematology and Oncology

## 2015-12-09 ENCOUNTER — Other Ambulatory Visit (HOSPITAL_BASED_OUTPATIENT_CLINIC_OR_DEPARTMENT_OTHER): Payer: BC Managed Care – PPO

## 2015-12-09 ENCOUNTER — Encounter: Payer: Self-pay | Admitting: Hematology and Oncology

## 2015-12-09 ENCOUNTER — Telehealth: Payer: Self-pay | Admitting: *Deleted

## 2015-12-09 DIAGNOSIS — D61810 Antineoplastic chemotherapy induced pancytopenia: Secondary | ICD-10-CM

## 2015-12-09 DIAGNOSIS — R5081 Fever presenting with conditions classified elsewhere: Secondary | ICD-10-CM

## 2015-12-09 DIAGNOSIS — C561 Malignant neoplasm of right ovary: Secondary | ICD-10-CM

## 2015-12-09 DIAGNOSIS — C562 Malignant neoplasm of left ovary: Principal | ICD-10-CM

## 2015-12-09 DIAGNOSIS — D61818 Other pancytopenia: Secondary | ICD-10-CM

## 2015-12-09 DIAGNOSIS — T451X5A Adverse effect of antineoplastic and immunosuppressive drugs, initial encounter: Secondary | ICD-10-CM

## 2015-12-09 DIAGNOSIS — C569 Malignant neoplasm of unspecified ovary: Secondary | ICD-10-CM

## 2015-12-09 DIAGNOSIS — C563 Malignant neoplasm of bilateral ovaries: Secondary | ICD-10-CM

## 2015-12-09 DIAGNOSIS — D709 Neutropenia, unspecified: Secondary | ICD-10-CM | POA: Diagnosis not present

## 2015-12-09 DIAGNOSIS — G620 Drug-induced polyneuropathy: Secondary | ICD-10-CM

## 2015-12-09 LAB — COMPREHENSIVE METABOLIC PANEL
ALT: 32 U/L (ref 0–55)
AST: 18 U/L (ref 5–34)
Albumin: 3.7 g/dL (ref 3.5–5.0)
Alkaline Phosphatase: 102 U/L (ref 40–150)
Anion Gap: 10 mEq/L (ref 3–11)
BUN: 15.6 mg/dL (ref 7.0–26.0)
CHLORIDE: 105 meq/L (ref 98–109)
CO2: 25 meq/L (ref 22–29)
CREATININE: 0.7 mg/dL (ref 0.6–1.1)
Calcium: 10 mg/dL (ref 8.4–10.4)
EGFR: >90 mL/min/{1.73_m2} (ref >90)
Glucose: 120 mg/dl (ref 70–140)
Potassium: 3.5 mEq/L (ref 3.5–5.1)
Sodium: 139 mEq/L (ref 136–145)
Total Bilirubin: 0.76 mg/dL (ref 0.20–1.20)
Total Protein: 8.2 g/dL (ref 6.4–8.3)

## 2015-12-09 LAB — CBC WITH DIFFERENTIAL/PLATELET
BASO%: 0.3 % (ref 0.0–2.0)
Basophils Absolute: 0 10*3/uL (ref 0.0–0.1)
EOS%: 0.1 % (ref 0.0–7.0)
Eosinophils Absolute: 0 10*3/uL (ref 0.0–0.5)
HEMATOCRIT: 33.2 % — AB (ref 34.8–46.6)
HGB: 11.1 g/dL — ABNORMAL LOW (ref 11.6–15.9)
LYMPH#: 0.9 10*3/uL (ref 0.9–3.3)
LYMPH%: 16.2 % (ref 14.0–49.7)
MCH: 32.3 pg (ref 25.1–34.0)
MCHC: 33.4 g/dL (ref 31.5–36.0)
MCV: 96.6 fL (ref 79.5–101.0)
MONO#: 0.5 10*3/uL (ref 0.1–0.9)
MONO%: 8.4 % (ref 0.0–14.0)
NEUT%: 75 % (ref 38.4–76.8)
NEUTROS ABS: 4.1 10*3/uL (ref 1.5–6.5)
Platelets: 96 10*3/uL — ABNORMAL LOW (ref 145–400)
RBC: 3.43 10*6/uL — AB (ref 3.70–5.45)
RDW: 22.5 % — ABNORMAL HIGH (ref 11.2–14.5)
WBC: 5.4 10*3/uL (ref 3.9–10.3)

## 2015-12-09 MED ORDER — LIDOCAINE-PRILOCAINE 2.5-2.5 % EX CREA: 1.0000 "application " | TOPICAL_CREAM | CUTANEOUS | 6 refills | Status: DC | PRN

## 2015-12-09 NOTE — Telephone Encounter (Signed)
Message sent to infusion scheduler to be added per 12/09/15 los. Appointments scheduled per 12/09/15 los. A copy of the AVS report & appointment schedule was given to patient,per 12/09/15 los.

## 2015-12-09 NOTE — Telephone Encounter (Signed)
Per LOS I have scheduled appts and notified the scheduler 

## 2015-12-10 ENCOUNTER — Encounter: Payer: Self-pay | Admitting: Hematology and Oncology

## 2015-12-10 DIAGNOSIS — T451X5A Adverse effect of antineoplastic and immunosuppressive drugs, initial encounter: Secondary | ICD-10-CM

## 2015-12-10 DIAGNOSIS — G620 Drug-induced polyneuropathy: Secondary | ICD-10-CM | POA: Insufficient documentation

## 2015-12-10 LAB — CULTURE, BLOOD (ROUTINE X 2)
CULTURE: NO GROWTH
Culture: NO GROWTH

## 2015-12-10 NOTE — Assessment & Plan Note (Signed)
She had neutropenic sepsis recently. I will get insurance approval for G-CSF support to be given next week after chemotherapy

## 2015-12-10 NOTE — Assessment & Plan Note (Signed)
We discussed management for peripheral neuropathy. She was started on low-dose Lyrica. I will reassess next visit and consider dose adjustment based on tolerance and benefits

## 2015-12-10 NOTE — Assessment & Plan Note (Signed)
She is not symptomatic. She does not need any transfusion support today. Given her history of coronary artery disease, I will give her 1 unit of blood if her hemoglobin is less than 8 and 1 unit of platelets if she has signs of bleeding or platelet count less than 10,000

## 2015-12-10 NOTE — Assessment & Plan Note (Signed)
I have discussed with her GYN oncologist. I would defer for further management. She will complete her final treatment next week at [facility] and return here for supportive care

## 2015-12-10 NOTE — Progress Notes (Signed)
[place] OFFICE PROGRESS NOTE  Patient Care Team: Provider Not In System as PCP - General  SUMMARY OF ONCOLOGIC HISTORY:   Ovarian cancer ([place])   07/13/2014 Initial Diagnosis    Cancer of ovary, IIIA      07/13/2014 Surgery    RSO, staging       Ovarian cancer, bilateral ([place])   06/19/2014 Imaging    Complex cystic mass is identified within the pelvis. This is likely arising from the right ovary. The cyst is complicated by internal areas of septation and large dystrophic calcifications. This likely represents an ovarian tumor. 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 No macroscopic fat however is identified. Surgical evaluation is recommended. 2. Nephrolithiasis. 3. Left kidney scarring.      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 (please see synoptic report and comment)  B:Pelvic adhesion, excision - Involved by serous carcinoma, largest focus measures 0.7 cm  C:Omentum, omentectomy - Involved by serous carcinoma, focus measures 0.3 cm   D:Peritoneum, pelvic nodule, excision  - No serous carcinoma identified - Fibroadipose tissue with chronic inflammation and focal calcification  Extensive lymph node dissection showed no evidence of lymph node involvement      07/15/2014 Surgery    She underwent SALPINGO-OOPHORECTOMY, COMPLETE OR PARTIAL, UNILATERAL OR BILATERAL (SEPARATE PROCEDURE) OMENTECTOMY, EPIPLOECTOMY, RESECTION OF OMENTUM LIMITED LYMPHADENECTOMY FOR STAGING (SEPARATE PROCEDURE); PELVIC AND PARA-AORTIC URETEROLYSIS, WITH OR WITHOUT REPOSITIONING OF URETER FOR RETROPERITONEAL FIBROSIS          08/10/2014 - 12/10/2014 Chemotherapy    She received 6 cycles of adjuvant treatment with carboplatin and Taxol       12/24/2014  Imaging    Outside CT scan of the abdomen and pelvis show no evidence of residual disease      07/19/2015 Pathology Results    Outside pathology: Left ovary - Invasive high grade serous carcinoma with associated psammoma bodies in a background of serous borderline tumor, 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) (see comment) - Largest invasive focus measures 0.6 cm (overall tumor is 3.9 cm) - Surface of the ovary is involved - Background follicular cysts, some hemorrhagic  Left fallopian tube - No carcinoma identified - Unremarkable fallopian tube with paratubal serous cysts - Fallopian tube adherent to left ovary      07/19/2015 Surgery    She underwent tobotic Laparoscopy, Surgical, With Total Hysterectomy, For Uterus 250 G Or Less; W/Removal Tube(S) And/Or Ovary(S)         08/12/2015 -  Chemotherapy    She received adjuvant carbo[facility]taxol through [facility]       08/20/2015 Miscellaneous    She was evaluated by genetics testing and results are negative      11/28/2015 - 12/02/2015 Hospital Admission    She was admitted to the hospital with neutropenic sepsis and severe pancytopenia.      12/02/2015 Imaging    Focal area of altered perfusion in the interpolar right kidney. Pyelonephritis would be a consideration. 2. Nonobstructing left renal stones with associated scarring. 3. Stable appearance right renal cysts.       INTERVAL HISTORY: Please see below for problem oriented charting. She returns with her mother for follow-up. Her neuropathy is about the same. She denies recent fever, chills, nausea or dizziness. Her appetite is stable. Denies recent nausea or vomiting. She  denies recent chest pain or shortness of breath The patient denies any recent signs or symptoms of bleeding such as spontaneous epistaxis, hematuria or hematochezia.   REVIEW OF SYSTEMS:   Constitutional: Denies fevers, chills or abnormal weight loss Eyes: Denies blurriness of  vision Ears, nose, mouth, throat, and face: Denies mucositis or sore throat Respiratory: Denies cough, dyspnea or wheezes Cardiovascular: Denies palpitation, chest discomfort or lower extremity swelling Gastrointestinal:  Denies nausea, heartburn or change in bowel habits Skin: Denies abnormal skin rashes Lymphatics: Denies new lymphadenopathy or easy bruising Neurological:Denies numbness, tingling or new weaknesses Behavioral/Psych: Mood is stable, no new changes  All other systems were reviewed with the patient and are negative.  I have reviewed the past medical history, past surgical history, social history and family history with the patient and they are unchanged from previous note.  ALLERGIES:  is allergic to lisinopril.  MEDICATIONS:  Current Outpatient Prescriptions  Medication Sig Dispense Refill  . albuterol (PROVENTIL HFA;VENTOLIN HFA) 108 (90 BASE) MCG/ACT inhaler Inhale 2 puffs into the lungs every 6 (six) hours as needed for wheezing or shortness of breath.    . ALPRAZolam (XANAX) 1 MG tablet Take 1 mg by mouth 3 (three) times daily as needed for anxiety.    Marland Kitchen amphetamine-dextroamphetamine (ADDERALL XR) 30 MG 24 hr capsule Take 30 mg by mouth daily with breakfast. Only takes when she is at school    . amphetamine-dextroamphetamine (ADDERALL) 20 MG tablet Take 20 mg by mouth daily with lunch. Only takes when she is at school    . aspirin 81 MG chewable tablet Chew 81 mg by mouth daily with breakfast.     . atorvastatin (LIPITOR) 40 MG tablet Take 40 mg by mouth at bedtime.     Marland Kitchen b complex vitamins tablet Take 1 tablet by mouth daily.    . cephALEXin (KEFLEX) 500 MG capsule Take 1 capsule (500 mg total) by mouth 2 (two) times daily. 18 capsule 0  . cetirizine (ZYRTEC) 10 MG tablet Take 10 mg by mouth daily with breakfast.     . dexamethasone (DECADRON) 4 MG tablet Take 8 mg by mouth daily with breakfast. Pt only takes this medication on days 2,3, and 4 of chemo.    Marland Kitchen  HYDROcodone-homatropine (HYCODAN) 5-1.5 MG/5ML syrup Take 5 mLs by mouth every 6 (six) hours as needed for cough. 180 mL 0  . ibuprofen (ADVIL,MOTRIN) 200 MG tablet Take 400 mg by mouth every 6 (six) hours as needed for fever, headache, mild pain, moderate pain or cramping.    Marland Kitchen losartan (COZAAR) 50 MG tablet Take 100 mg by mouth daily with breakfast.     . magnesium oxide (MAGNESIUM-OXIDE) 400 (241.3 Mg) MG tablet Take 400 mg by mouth 2 (two) times daily.    . metoprolol succinate (TOPROL-XL) 100 MG 24 hr tablet Take 100 mg by mouth daily with breakfast. Take with or immediately following a meal.     . Multiple Vitamin (MULTIVITAMIN WITH MINERALS) TABS tablet Take 1 tablet by mouth daily with breakfast.     . nitroGLYCERIN (NITROSTAT) 0.4 MG SL tablet Place 0.4 mg under the tongue every 5 (five) minutes as needed for chest pain.    . Omega-3 Fatty Acids (FISH OIL PO) Take 1 capsule by mouth daily.    . phenazopyridine (PYRIDIUM) 200 MG tablet Take 1 tablet (200 mg total) by mouth 3 (three) times daily. 6 tablet 0  . potassium chloride SA (K-DUR,KLOR-CON) 20 MEQ tablet Take 20 mEq  by mouth daily with breakfast.    . prasugrel (EFFIENT) 10 MG TABS tablet Take 10 mg by mouth daily with breakfast.     . venlafaxine XR (EFFEXOR-XR) 150 MG 24 hr capsule Take 150 mg by mouth daily with breakfast.     . acetaminophen (TYLENOL) 500 MG tablet Take 1,000 mg by mouth every 6 (six) hours as needed for mild pain, moderate pain, fever or headache.    . lidocaine-prilocaine (EMLA) cream Apply 1 application topically as needed. 30 g 6  . oxyCODONE (OXY IR/ROXICODONE) 5 MG immediate release tablet Take 5 mg by mouth every 6 (six) hours as needed for severe pain (Only takes during chemo).     . pregabalin (LYRICA) 25 MG capsule Take 1 capsule (25 mg total) by mouth 2 (two) times daily. (Patient not taking: Reported on 12/09/2015) 30 capsule 0   No current facility-administered medications for this visit.      PHYSICAL EXAMINATION: ECOG PERFORMANCE STATUS: 1 - Symptomatic but completely ambulatory  Vitals:   12/09/15 1246  BP: (!) 142/82  Pulse: 88  Resp: 16  Temp: 98 F (36.7 C)   Filed Weights   12/09/15 1246  Weight: 196 lb 4.8 oz (89 kg)    GENERAL:alert, no distress and comfortable. She looks mildly cushingoid SKIN: skin color, texture, turgor are normal, no rashes or significant lesions EYES: normal, Conjunctiva are pink and non-injected, sclera clear OROPHARYNX:no exudate, no erythema and lips, buccal mucosa, and tongue normal  NECK: supple, thyroid normal size, non-tender, without nodularity LYMPH:  no palpable lymphadenopathy in the cervical, axillary or inguinal LUNGS: clear to auscultation and percussion with normal breathing effort HEART: regular rate & rhythm and no murmurs and no lower extremity edema ABDOMEN:abdomen soft, non-tender and normal bowel sounds Musculoskeletal:no cyanosis of digits and no clubbing  NEURO: alert & oriented x 3 with fluent speech, no focal motor/sensory deficits  LABORATORY DATA:  I have reviewed the data as listed    Component Value Date/Time   NA 139 12/09/2015 1233   K 3.5 12/09/2015 1233   CL 104 12/05/2015 0123   CO2 25 12/09/2015 1233   GLUCOSE 120 12/09/2015 1233   BUN 15.6 12/09/2015 1233   CREATININE 0.7 12/09/2015 1233   CALCIUM 10.0 12/09/2015 1233   PROT 8.2 12/09/2015 1233   ALBUMIN 3.7 12/09/2015 1233   AST 18 12/09/2015 1233   ALT 32 12/09/2015 1233   ALKPHOS 102 12/09/2015 1233   BILITOT 0.76 12/09/2015 1233   GFRNONAA >60 12/05/2015 0123   GFRAA >60 12/05/2015 0123    No results found for: SPEP, UPEP  Lab Results  Component Value Date   WBC 5.4 12/09/2015   NEUTROABS 4.1 12/09/2015   HGB 11.1 (L) 12/09/2015   HCT 33.2 (L) 12/09/2015   MCV 96.6 12/09/2015   PLT 96 (L) 12/09/2015      Chemistry      Component Value Date/Time   NA 139 12/09/2015 1233   K 3.5 12/09/2015 1233   CL 104 12/05/2015  0123   CO2 25 12/09/2015 1233   BUN 15.6 12/09/2015 1233   CREATININE 0.7 12/09/2015 1233      Component Value Date/Time   CALCIUM 10.0 12/09/2015 1233   ALKPHOS 102 12/09/2015 1233   AST 18 12/09/2015 1233   ALT 32 12/09/2015 1233   BILITOT 0.76 12/09/2015 1233     ASSESSMENT & PLAN:  Ovarian cancer, bilateral ([place]) I have discussed with her GYN oncologist. I would defer for  further management. She will complete her final treatment next week at [facility] and return here for supportive care  Neutropenia with fever [facility]) She had neutropenic sepsis recently. I will get insurance approval for G-CSF support to be given next week after chemotherapy  Pancytopenia ([place])- due to chemoRx She is not symptomatic. She does not need any transfusion support today. Given her history of coronary artery disease, I will give her 1 unit of blood if her hemoglobin is less than 8 and 1 unit of platelets if she has signs of bleeding or platelet count less than 10,000  Peripheral neuropathy due to chemotherapy [facility]) We discussed management for peripheral neuropathy. She was started on low-dose Lyrica. I will reassess next visit and consider dose adjustment based on tolerance and benefits   No orders of the defined types were placed in this encounter.  All questions were answered. The patient knows to call the clinic with any problems, questions or concerns. No barriers to learning was detected. I spent 25 minutes counseling the patient face to face. The total time spent in the appointment was 40 minutes and more than 50% was on counseling and review of test results     Heath Lark, MD 12/10/2015 11:11 AM

## 2015-12-18 ENCOUNTER — Ambulatory Visit: Payer: BC Managed Care – PPO

## 2015-12-19 ENCOUNTER — Other Ambulatory Visit: Payer: BC Managed Care – PPO

## 2015-12-20 ENCOUNTER — Inpatient Hospital Stay: Payer: BC Managed Care – PPO | Admitting: Hematology and Oncology

## 2015-12-20 ENCOUNTER — Other Ambulatory Visit: Payer: Self-pay | Admitting: Hematology and Oncology

## 2015-12-20 ENCOUNTER — Ambulatory Visit (HOSPITAL_COMMUNITY)
Admission: RE | Admit: 2015-12-20 | Discharge: 2015-12-20 | Disposition: A | Discharge status: Home / Self Care | Payer: BC Managed Care – PPO | Source: Ambulatory Visit | Attending: Hematology and Oncology | Admitting: Hematology and Oncology

## 2015-12-20 DIAGNOSIS — D61818 Other pancytopenia: Secondary | ICD-10-CM

## 2015-12-20 DIAGNOSIS — C562 Malignant neoplasm of left ovary: Principal | ICD-10-CM

## 2015-12-20 DIAGNOSIS — C561 Malignant neoplasm of right ovary: Secondary | ICD-10-CM

## 2015-12-20 DIAGNOSIS — C563 Malignant neoplasm of bilateral ovaries: Secondary | ICD-10-CM

## 2015-12-23 ENCOUNTER — Other Ambulatory Visit (HOSPITAL_BASED_OUTPATIENT_CLINIC_OR_DEPARTMENT_OTHER): Payer: BC Managed Care – PPO

## 2015-12-23 ENCOUNTER — Telehealth: Payer: Self-pay | Admitting: Hematology and Oncology

## 2015-12-23 ENCOUNTER — Ambulatory Visit (HOSPITAL_BASED_OUTPATIENT_CLINIC_OR_DEPARTMENT_OTHER): Payer: BC Managed Care – PPO | Admitting: Hematology and Oncology

## 2015-12-23 ENCOUNTER — Ambulatory Visit (HOSPITAL_COMMUNITY)
Admission: RE | Admit: 2015-12-23 | Discharge: 2015-12-23 | Disposition: A | Discharge status: Home / Self Care | Payer: BC Managed Care – PPO | Source: Ambulatory Visit | Attending: Hematology and Oncology | Admitting: Hematology and Oncology

## 2015-12-23 ENCOUNTER — Other Ambulatory Visit: Payer: Self-pay | Admitting: Hematology and Oncology

## 2015-12-23 ENCOUNTER — Encounter: Payer: Self-pay | Admitting: Hematology and Oncology

## 2015-12-23 ENCOUNTER — Ambulatory Visit: Payer: BC Managed Care – PPO

## 2015-12-23 ENCOUNTER — Encounter: Payer: BC Managed Care – PPO | Admitting: Nutrition

## 2015-12-23 VITALS — BP 120/64 | HR 99 | Temp 97.8°F | Resp 18 | Wt 196.7 lb

## 2015-12-23 DIAGNOSIS — D61810 Antineoplastic chemotherapy induced pancytopenia: Secondary | ICD-10-CM

## 2015-12-23 DIAGNOSIS — R5081 Fever presenting with conditions classified elsewhere: Secondary | ICD-10-CM | POA: Insufficient documentation

## 2015-12-23 DIAGNOSIS — C563 Malignant neoplasm of bilateral ovaries: Secondary | ICD-10-CM

## 2015-12-23 DIAGNOSIS — C562 Malignant neoplasm of left ovary: Secondary | ICD-10-CM | POA: Diagnosis not present

## 2015-12-23 DIAGNOSIS — D709 Neutropenia, unspecified: Secondary | ICD-10-CM

## 2015-12-23 DIAGNOSIS — D61818 Other pancytopenia: Secondary | ICD-10-CM

## 2015-12-23 DIAGNOSIS — E876 Hypokalemia: Secondary | ICD-10-CM

## 2015-12-23 DIAGNOSIS — G620 Drug-induced polyneuropathy: Secondary | ICD-10-CM | POA: Diagnosis not present

## 2015-12-23 DIAGNOSIS — C561 Malignant neoplasm of right ovary: Secondary | ICD-10-CM

## 2015-12-23 DIAGNOSIS — K1231 Oral mucositis (ulcerative) due to antineoplastic therapy: Secondary | ICD-10-CM

## 2015-12-23 DIAGNOSIS — Z452 Encounter for adjustment and management of vascular access device: Secondary | ICD-10-CM

## 2015-12-23 DIAGNOSIS — T451X5A Adverse effect of antineoplastic and immunosuppressive drugs, initial encounter: Secondary | ICD-10-CM

## 2015-12-23 LAB — COMPREHENSIVE METABOLIC PANEL
ALBUMIN: 3.8 g/dL (ref 3.5–5.0)
ALK PHOS: 111 U/L (ref 40–150)
ALT: 33 U/L (ref 0–55)
ANION GAP: 13 meq/L — AB (ref 3–11)
AST: 21 U/L (ref 5–34)
BILIRUBIN TOTAL: 0.66 mg/dL (ref 0.20–1.20)
BUN: 11.4 mg/dL (ref 7.0–26.0)
CO2: 24 mEq/L (ref 22–29)
Calcium: 8.5 mg/dL (ref 8.4–10.4)
Chloride: 100 mEq/L (ref 98–109)
Creatinine: 0.6 mg/dL (ref 0.6–1.1)
Glucose: 123 mg/dl (ref 70–140)
Potassium: 3.2 mEq/L — ABNORMAL LOW (ref 3.5–5.1)
Sodium: 137 mEq/L (ref 136–145)
TOTAL PROTEIN: 7.4 g/dL (ref 6.4–8.3)

## 2015-12-23 LAB — URINALYSIS, MICROSCOPIC - CHCC
BILIRUBIN (URINE): NEGATIVE
BLOOD: NEGATIVE
Glucose: NEGATIVE mg/dL
Ketones: NEGATIVE mg/dL
LEUKOCYTE ESTERASE: NEGATIVE
NITRITE: NEGATIVE
PH: 7.5 (ref 4.6–8.0)
Protein: 30 mg/dL
SPECIFIC GRAVITY, URINE: 1.01 (ref 1.003–1.035)
UROBILINOGEN UR: 0.2 mg/dL (ref 0.2–1)

## 2015-12-23 LAB — CBC WITH DIFFERENTIAL/PLATELET
BASO%: 0 % (ref 0.0–2.0)
BASOS ABS: 0 10*3/uL (ref 0.0–0.1)
EOS ABS: 0.1 10*3/uL (ref 0.0–0.5)
EOS%: 2 % (ref 0.0–7.0)
HEMATOCRIT: 26.5 % — AB (ref 34.8–46.6)
HEMOGLOBIN: 9.1 g/dL — AB (ref 11.6–15.9)
LYMPH#: 0.8 10*3/uL — AB (ref 0.9–3.3)
LYMPH%: 24.9 % (ref 14.0–49.7)
MCH: 33.5 pg (ref 25.1–34.0)
MCHC: 34.3 g/dL (ref 31.5–36.0)
MCV: 97.4 fL (ref 79.5–101.0)
MONO#: 0.9 10*3/uL (ref 0.1–0.9)
MONO%: 28.9 % — ABNORMAL HIGH (ref 0.0–14.0)
NEUT#: 1.4 10*3/uL — ABNORMAL LOW (ref 1.5–6.5)
NEUT%: 44.2 % (ref 38.4–76.8)
NRBC: 0 % (ref 0–0)
PLATELETS: 56 10*3/uL — AB (ref 145–400)
RBC: 2.72 10*6/uL — ABNORMAL LOW (ref 3.70–5.45)
RDW: 20.6 % — AB (ref 11.2–14.5)
WBC: 3.1 10*3/uL — ABNORMAL LOW (ref 3.9–10.3)

## 2015-12-23 MED ORDER — HEPARIN SOD (PORK) LOCK FLUSH 100 UNIT/ML IV SOLN
500.0000 [IU] | Freq: Once | INTRAVENOUS | Status: AC
  Administered 2015-12-23: 500 [IU] via INTRAVENOUS
  Filled 2015-12-23: qty 5

## 2015-12-23 MED ORDER — OXYCODONE HCL 10 MG PO TABS: 10.0000 mg | ORAL_TABLET | ORAL | 0 refills | Status: DC | PRN

## 2015-12-23 MED ORDER — MAGIC MOUTHWASH W/LIDOCAINE: 10.0000 mL | Freq: Four times a day (QID) | ORAL | 0 refills | Status: DC | PRN

## 2015-12-23 MED ORDER — SODIUM CHLORIDE 0.9% FLUSH
10.0000 mL | INTRAVENOUS | Status: DC | PRN
Start: 2015-12-23 — End: 2015-12-23
  Administered 2015-12-23: 10 mL via INTRAVENOUS
  Filled 2015-12-23: qty 10

## 2015-12-23 MED ORDER — SODIUM CHLORIDE 0.9% FLUSH
10.0000 mL | INTRAVENOUS | Status: DC | PRN
  Administered 2015-12-23: 10 mL via INTRAVENOUS
  Filled 2015-12-23: qty 10

## 2015-12-23 MED FILL — CMPD MMW LID:NYS:DPH:MAX: 3 days supply | Qty: 120 | Fill #0

## 2015-12-23 MED FILL — oxyCODONE HCL 10 MG TABS: 10 | 10 days supply | Qty: 60 | Fill #0

## 2015-12-23 NOTE — Assessment & Plan Note (Signed)
She is not symptomatic. She does not need any transfusion support today. Given her history of coronary artery disease, I will give her 1 unit of blood if her hemoglobin is less than 8 and 1 unit of platelets if she has signs of bleeding or platelet count less than 10,000

## 2015-12-23 NOTE — Telephone Encounter (Signed)
Return phone calls have been made

## 2015-12-23 NOTE — Telephone Encounter (Signed)
Voicemail: "I am to have labs ad see Dr. Alvy Bimler today at 11:00 and would like to come in earlier.  I have a cough that is so painful it makes me cry.  No temperature.  I can't swallow, can't sleep but hour intervals, I do not know if I have bronchitis or what."

## 2015-12-23 NOTE — Assessment & Plan Note (Signed)
She is not symptomatic from hypokalemia. We discussed dietary modification with potassium rich diet rather than prescription potassium replacement therapy.

## 2015-12-23 NOTE — Assessment & Plan Note (Signed)
She has severe mucositis pain from recent chemotherapy. I recommend prescription pain medicine along with Magic mouthwash.

## 2015-12-23 NOTE — Assessment & Plan Note (Addendum)
The patient completed 6 cycles of adjuvant treatment through [facility]. She is recovering from her side effects of treatment. I will get assistance from cancer survivorship clinic to arrange for appointment here. In the meantime, we discussed future follow-up. Her oncologist at [facility] recommended repeat CT scan at the end of the month. I felt that this is too soon from her recent CT scan. She probably would not need repeat imaging study for at least 3-6 months from her last CT scan. I will also ask on her behalf to see if she can be seen locally rather than traveling all the way to [facility]. She agreed

## 2015-12-23 NOTE — Assessment & Plan Note (Signed)
This is stable but difficult to assess due to severe mucositis pain. I plan to reassess in her next visit

## 2015-12-23 NOTE — Telephone Encounter (Signed)
Patient called at 9AM per experiencing severe pain. Shelley Vega stated that she was experiencing pain in her chest and throat she couldn't swallow. She asked to speak with desk nurse or Melissa. LVM with Desk nurse. Appointment with patient is scheduled for later in morning with [place].

## 2015-12-23 NOTE — Progress Notes (Signed)
[place] OFFICE PROGRESS NOTE  Patient Care Team: Provider Not In System as PCP - General  SUMMARY OF ONCOLOGIC HISTORY:   Ovarian cancer ([place])   07/13/2014 Initial Diagnosis    Cancer of ovary, IIIA      07/13/2014 Surgery    RSO, staging       Ovarian cancer, bilateral ([place])   06/19/2014 Imaging    Complex cystic mass is identified within the pelvis. This is likely arising from the right ovary. The cyst is complicated by internal areas of septation and large dystrophic calcifications. This likely represents an ovarian tumor. 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 No macroscopic fat however is identified. Surgical evaluation is recommended. 2. Nephrolithiasis. 3. Left kidney scarring.      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 (please see synoptic report and comment)  B:Pelvic adhesion, excision - Involved by serous carcinoma, largest focus measures 0.7 cm  C:Omentum, omentectomy - Involved by serous carcinoma, focus measures 0.3 cm   D:Peritoneum, pelvic nodule, excision  - No serous carcinoma identified - Fibroadipose tissue with chronic inflammation and focal calcification  Extensive lymph node dissection showed no evidence of lymph node involvement      07/15/2014 Surgery    She underwent SALPINGO-OOPHORECTOMY, COMPLETE OR PARTIAL, UNILATERAL OR BILATERAL (SEPARATE PROCEDURE) OMENTECTOMY, EPIPLOECTOMY, RESECTION OF OMENTUM LIMITED LYMPHADENECTOMY FOR STAGING (SEPARATE PROCEDURE); PELVIC AND PARA-AORTIC URETEROLYSIS, WITH OR WITHOUT REPOSITIONING OF URETER FOR RETROPERITONEAL FIBROSIS          08/10/2014 - 12/10/2014 Chemotherapy    She received 6 cycles of adjuvant treatment with carboplatin and Taxol       12/24/2014  Imaging    Outside CT scan of the abdomen and pelvis show no evidence of residual disease      07/19/2015 Pathology Results    Outside pathology: Left ovary - Invasive high grade serous carcinoma with associated psammoma bodies in a background of serous borderline tumor, 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) (see comment) - Largest invasive focus measures 0.6 cm (overall tumor is 3.9 cm) - Surface of the ovary is involved - Background follicular cysts, some hemorrhagic  Left fallopian tube - No carcinoma identified - Unremarkable fallopian tube with paratubal serous cysts - Fallopian tube adherent to left ovary      07/19/2015 Surgery    She underwent tobotic Laparoscopy, Surgical, With Total Hysterectomy, For Uterus 250 G Or Less; W/Removal Tube(S) And/Or Ovary(S)         08/12/2015 - 12/16/2015 Chemotherapy    She received adjuvant carbo/taxol through [facility] x 6 cycles       08/20/2015 Miscellaneous    She was evaluated by genetics testing and results are negative      11/28/2015 - 12/02/2015 Hospital Admission    She was admitted to the hospital with neutropenic sepsis and severe pancytopenia.      12/02/2015 Imaging    Focal area of altered perfusion in the interpolar right kidney. Pyelonephritis would be a consideration. 2. Nonobstructing left renal stones with associated scarring. 3. Stable appearance right renal cysts.       INTERVAL HISTORY: Please see below for problem oriented charting. She returns today with her mother for follow-up. She completed 6th cycle of treatment last week. She started to have significant mucositis pain in the past 2 days.  Oxycodone appears to help. She has some mild cough but nonproductive. Denies recent fever or chills. The patient denies any recent signs or symptoms of bleeding such as spontaneous epistaxis, hematuria or hematochezia.   REVIEW OF SYSTEMS:   Constitutional: Denies fevers, chills or abnormal  weight loss Eyes: Denies blurriness of vision Cardiovascular: Denies palpitation, chest discomfort or lower extremity swelling Gastrointestinal:  Denies nausea, heartburn or change in bowel habits Skin: Denies abnormal skin rashes Lymphatics: Denies new lymphadenopathy or easy bruising Neurological:Denies numbness, tingling or new weaknesses Behavioral/Psych: Mood is stable, no new changes  All other systems were reviewed with the patient and are negative.  I have reviewed the past medical history, past surgical history, social history and family history with the patient and they are unchanged from previous note.  ALLERGIES:  is allergic to lisinopril.  MEDICATIONS:  Current Outpatient Prescriptions  Medication Sig Dispense Refill  . acetaminophen (TYLENOL) 500 MG tablet Take 1,000 mg by mouth every 6 (six) hours as needed for mild pain, moderate pain, fever or headache.    . albuterol (PROVENTIL HFA;VENTOLIN HFA) 108 (90 BASE) MCG/ACT inhaler Inhale 2 puffs into the lungs every 6 (six) hours as needed for wheezing or shortness of breath.    . ALPRAZolam (XANAX) 1 MG tablet Take 1 mg by mouth 3 (three) times daily as needed for anxiety.    Marland Kitchen aspirin 81 MG chewable tablet Chew 81 mg by mouth daily with breakfast.     . atorvastatin (LIPITOR) 40 MG tablet Take 40 mg by mouth at bedtime.     Marland Kitchen b complex vitamins tablet Take 1 tablet by mouth daily.    . cetirizine (ZYRTEC) 10 MG tablet Take 10 mg by mouth daily with breakfast.     . dexamethasone (DECADRON) 4 MG tablet Take 8 mg by mouth daily with breakfast. Pt only takes this medication on days 2,3, and 4 of chemo.    Marland Kitchen ibuprofen (ADVIL,MOTRIN) 200 MG tablet Take 400 mg by mouth every 6 (six) hours as needed for fever, headache, mild pain, moderate pain or cramping.    . lidocaine-prilocaine (EMLA) cream Apply 1 application topically as needed. 30 g 6  . losartan (COZAAR) 50 MG tablet Take 100 mg by mouth daily with breakfast.     .  magnesium oxide (MAGNESIUM-OXIDE) 400 (241.3 Mg) MG tablet Take 400 mg by mouth 2 (two) times daily.    . metoprolol succinate (TOPROL-XL) 100 MG 24 hr tablet Take 100 mg by mouth daily with breakfast. Take with or immediately following a meal.     . Multiple Vitamin (MULTIVITAMIN WITH MINERALS) TABS tablet Take 1 tablet by mouth daily with breakfast.     . Omega-3 Fatty Acids (FISH OIL PO) Take 1 capsule by mouth daily.    Marland Kitchen oxyCODONE (OXY IR/ROXICODONE) 5 MG immediate release tablet Take 5 mg by mouth every 6 (six) hours as needed for severe pain (Only takes during chemo).     . prasugrel (EFFIENT) 10 MG TABS tablet Take 10 mg by mouth daily with breakfast.     . venlafaxine XR (EFFEXOR-XR) 75 MG 24 hr capsule Take 75 mg by mouth.    Marland Kitchen amphetamine-dextroamphetamine (ADDERALL XR) 30 MG 24 hr capsule Take 30 mg by mouth daily with breakfast. Only takes when she is at school    . amphetamine-dextroamphetamine (ADDERALL) 20 MG tablet Take 20 mg by mouth daily with lunch. Only takes when she is at school    . clonazePAM (KLONOPIN)  1 MG tablet TK 1 T PO BID PRA DO NOT TAKE WITH ALPRAZOLAM  0  . HYDROcodone-homatropine (HYCODAN) 5-1.5 MG/5ML syrup Take 5 mLs by mouth every 6 (six) hours as needed for cough. (Patient not taking: Reported on 12/23/2015) 180 mL 0  . magic mouthwash w/lidocaine SOLN Take 10 mLs by mouth 4 (four) times daily as needed for mouth pain. 120 mL 0  . nitroGLYCERIN (NITROSTAT) 0.4 MG SL tablet Place 0.4 mg under the tongue every 5 (five) minutes as needed for chest pain.    Marland Kitchen oxyCODONE 10 MG TABS Take 1 tablet (10 mg total) by mouth every 4 (four) hours as needed for severe pain. 60 tablet 0  . phenazopyridine (PYRIDIUM) 200 MG tablet Take 1 tablet (200 mg total) by mouth 3 (three) times daily. (Patient not taking: Reported on 12/23/2015) 6 tablet 0  . potassium chloride SA (K-DUR,KLOR-CON) 20 MEQ tablet Take 20 mEq by mouth daily with breakfast.    . pregabalin (LYRICA) 25 MG capsule  Take 1 capsule (25 mg total) by mouth 2 (two) times daily. (Patient not taking: Reported on 12/23/2015) 30 capsule 0   Current Facility-Administered Medications  Medication Dose Route Frequency Provider Last Rate Last Dose  . sodium chloride flush (NS) 0.9 % injection 10 mL  10 mL Intravenous PRN Heath Lark, MD   10 mL at 12/23/15 1157    PHYSICAL EXAMINATION: ECOG PERFORMANCE STATUS: 1 - Symptomatic but completely ambulatory  Vitals:   12/23/15 1119  BP: 120/64  Pulse: 99  Resp: 18  Temp: 97.8 F (36.6 C)   Filed Weights   12/23/15 1119  Weight: 196 lb 11.2 oz (89.2 kg)    GENERAL:alert, no distress and comfortable SKIN: skin color, texture, turgor are normal, no rashes or significant lesions EYES: normal, Conjunctiva are pink and non-injected, sclera clear OROPHARYNX:no exudate, no erythema and lips, buccal mucosa, and tongue normal  NECK: supple, thyroid normal size, non-tender, without nodularity LYMPH:  no palpable lymphadenopathy in the cervical, axillary or inguinal LUNGS: clear to auscultation and percussion with normal breathing effort HEART: regular rate & rhythm and no murmurs and no lower extremity edema ABDOMEN:abdomen soft, non-tender and normal bowel sounds Musculoskeletal:no cyanosis of digits and no clubbing  NEURO: alert & oriented x 3 with fluent speech, no focal motor/sensory deficits  LABORATORY DATA:  I have reviewed the data as listed    Component Value Date/Time   NA 137 12/23/2015 1042   K 3.2 (L) 12/23/2015 1042   CL 104 12/05/2015 0123   CO2 24 12/23/2015 1042   GLUCOSE 123 12/23/2015 1042   BUN 11.4 12/23/2015 1042   CREATININE 0.6 12/23/2015 1042   CALCIUM 8.5 12/23/2015 1042   PROT 7.4 12/23/2015 1042   ALBUMIN 3.8 12/23/2015 1042   AST 21 12/23/2015 1042   ALT 33 12/23/2015 1042   ALKPHOS 111 12/23/2015 1042   BILITOT 0.66 12/23/2015 1042   GFRNONAA >60 12/05/2015 0123   GFRAA >60 12/05/2015 0123    No results found for: SPEP,  UPEP  Lab Results  Component Value Date   WBC 3.1 (L) 12/23/2015   NEUTROABS 1.4 (L) 12/23/2015   HGB 9.1 (L) 12/23/2015   HCT 26.5 (L) 12/23/2015   MCV 97.4 12/23/2015   PLT 56 (L) 12/23/2015      Chemistry      Component Value Date/Time   NA 137 12/23/2015 1042   K 3.2 (L) 12/23/2015 1042   CL 104 12/05/2015 0123   CO2  24 12/23/2015 1042   BUN 11.4 12/23/2015 1042   CREATININE 0.6 12/23/2015 1042      Component Value Date/Time   CALCIUM 8.5 12/23/2015 1042   ALKPHOS 111 12/23/2015 1042   AST 21 12/23/2015 1042   ALT 33 12/23/2015 1042   BILITOT 0.66 12/23/2015 1042       RADIOGRAPHIC STUDIES: I have personally reviewed the radiological images as listed and agreed with the findings in the report. Dg Chest 2 View  Result Date: 12/23/2015 CLINICAL DATA:  Cough. Neutropenia. Chest pain and shortness of breath for 5 days. History of ovarian cancer. EXAM: CHEST  2 VIEW COMPARISON:  11/30/2015 FINDINGS: Right jugular Port-A-Cath remains in place with tip overlying the SVC. Cardiomediastinal silhouette is within normal limits. No airspace consolidation, edema, pleural effusion, or pneumothorax is identified. No acute osseous abnormality is seen. IMPRESSION: No active cardiopulmonary disease. Electronically Signed   By: Logan Bores M.D.   On: 12/23/2015 09:59     ASSESSMENT & PLAN:  Ovarian cancer, bilateral [facility]) The patient completed 6 cycles of adjuvant treatment through [facility]. She is recovering from her side effects of treatment. I will get assistance from cancer survivorship clinic to arrange for appointment here. In the meantime, we discussed future follow-up. Her oncologist at [facility] recommended repeat CT scan at the end of the month. I felt that this is too soon from her recent CT scan. She probably would not need repeat imaging study for at least 3-6 months from her last CT scan. I will also ask on her behalf to see if she can be seen locally rather than  traveling all the way to [facility]. She agreed   Pancytopenia ([place])- due to chemoRx She is not symptomatic. She does not need any transfusion support today. Given her history of coronary artery disease, I will give her 1 unit of blood if her hemoglobin is less than 8 and 1 unit of platelets if she has signs of bleeding or platelet count less than 10,000  Peripheral neuropathy due to chemotherapy [facility]) This is stable but difficult to assess due to severe mucositis pain. I plan to reassess in her next visit  Mucositis due to antineoplastic therapy She has severe mucositis pain from recent chemotherapy. I recommend prescription pain medicine along with Magic mouthwash.  Hypokalemia She is not symptomatic from hypokalemia. We discussed dietary modification with potassium rich diet rather than prescription potassium replacement therapy.   No orders of the defined types were placed in this encounter.  All questions were answered. The patient knows to call the clinic with any problems, questions or concerns. No barriers to learning was detected. I spent 25 minutes counseling the patient face to face. The total time spent in the appointment was 40 minutes and more than 50% was on counseling and review of test results     Heath Lark, MD 12/23/2015 1:55 PM

## 2015-12-24 ENCOUNTER — Telehealth: Payer: Self-pay | Admitting: *Deleted

## 2015-12-24 NOTE — Telephone Encounter (Signed)
-----   Message from Heath Lark, MD sent at 12/23/2015  2:21 PM EST ----- Regarding: FW: cancer survivorship Pls let her know I am scheduling survivorship clinic visit with gretchen next week to discuss coping issues, etc I am still waiting to hear back from [place] about her f/up appt ----- Message ----- From: Holley Bouche, NP Sent: 12/23/2015   1:54 PM To: Heath Lark, MD Subject: RE: cancer survivorship                        Sure! I could see her next Monday (12/11) afternoon, if that would work for her.  Otherwise, whatever free slots I have available next week would be fine with me.  Have the schedulers put her in an "LTS" slot please.   Thank you! Elzie Rings   ----- Message ----- From: Heath Lark, MD Sent: 12/23/2015   1:41 PM To: Holley Bouche, NP Subject: cancer survivorship                            Hi Gretchen,  I am hoping you can see this nice lady next week. She comes back Monday for labs/transfusion but has a lot of survivorship issues. Can you see her next week?

## 2015-12-24 NOTE — Telephone Encounter (Signed)
-----   Message from Heath Lark, MD sent at 12/23/2015  2:21 PM EST ----- Regarding: FW: cancer survivorship Pls let her know I am scheduling survivorship clinic visit with gretchen next week to discuss coping issues, etc I am still waiting to hear back from [place] about her f/up appt ----- Message ----- From: Holley Bouche, NP Sent: 12/23/2015   1:54 PM To: Heath Lark, MD Subject: RE: cancer survivorship                        Sure! I could see her next Monday (12/11) afternoon, if that would work for her.  Otherwise, whatever free slots I have available next week would be fine with me.  Have the schedulers put her in an "LTS" slot please.   Thank you! Elzie Rings   ----- Message ----- From: Heath Lark, MD Sent: 12/23/2015   1:41 PM To: Holley Bouche, NP Subject: cancer survivorship                            Hi Gretchen,  I am hoping you can see this nice lady next week. She comes back Monday for labs/transfusion but has a lot of survivorship issues. Can you see her next week?

## 2015-12-24 NOTE — Telephone Encounter (Signed)
LM with Dr Alvy Bimler message. To call if has questions

## 2015-12-25 ENCOUNTER — Telehealth: Payer: Self-pay | Admitting: *Deleted

## 2015-12-25 ENCOUNTER — Other Ambulatory Visit: Payer: Self-pay | Admitting: Hematology and Oncology

## 2015-12-25 ENCOUNTER — Telehealth: Payer: Self-pay | Admitting: Hematology and Oncology

## 2015-12-25 DIAGNOSIS — R5081 Fever presenting with conditions classified elsewhere: Principal | ICD-10-CM

## 2015-12-25 DIAGNOSIS — D709 Neutropenia, unspecified: Secondary | ICD-10-CM

## 2015-12-25 MED ORDER — MAGIC MOUTHWASH W/LIDOCAINE: 10.0000 mL | Freq: Four times a day (QID) | ORAL | 0 refills | Status: DC | PRN

## 2015-12-25 MED ORDER — AMOXICILLIN-POT CLAVULANATE 875-125 MG PO TABS: 1.0000 | ORAL_TABLET | Freq: Two times a day (BID) | ORAL | 0 refills | Status: DC

## 2015-12-25 MED FILL — CMPD MMW LID:NYS:DPH:MAX: 3 days supply | Qty: 120 | Fill #0

## 2015-12-25 MED FILL — AMOX TR-K CLV 875-125 MG TA: 875-125 | 7 days supply | Qty: 14 | Fill #0

## 2015-12-25 NOTE — Telephone Encounter (Signed)
I spoke with the patient over the telephone. Her mucositis is improving. I will proceed to refill her prescription to [facility]. She is able to hydrate herself. She will return next week for repeat blood draw

## 2015-12-25 NOTE — Telephone Encounter (Signed)
The patient urine culture came back positive. She had a result with the patient. I have called in prescription of Augmentin. She agreed with the plan of care

## 2015-12-25 NOTE — Telephone Encounter (Signed)
"  I have mucositis, using MMW.  I used it all and need a refill.  Sometimes I gargled it sometimes I swallowed it I had no way to measure it and It's all gone.    Mom had to pick me up and bring me to [facility] because I couldn't take care of my self.  I two tiny red spots in my mouth.  I have no appetite.  I'm incredibly thirsty drinking four Yeti cups (32 oz ea) a day of water, Gatorade, tea but may eat a cracker.    Finished chemotherapy last Monday.  I cough but no fever.  Threw up twice yesterday and dry heaves.  Took ondansetron yesterday so no nausea or vomiting today.  Mom says I'm a yellow pale.  Return number [phone]."

## 2015-12-26 ENCOUNTER — Other Ambulatory Visit: Payer: BC Managed Care – PPO

## 2015-12-27 LAB — URINE CULTURE

## 2015-12-30 ENCOUNTER — Other Ambulatory Visit: Payer: Self-pay | Admitting: Hematology and Oncology

## 2015-12-30 ENCOUNTER — Ambulatory Visit: Payer: BC Managed Care – PPO | Admitting: Nutrition

## 2015-12-30 ENCOUNTER — Encounter: Payer: BC Managed Care – PPO | Admitting: Adult Health

## 2015-12-30 ENCOUNTER — Ambulatory Visit (HOSPITAL_BASED_OUTPATIENT_CLINIC_OR_DEPARTMENT_OTHER): Payer: BC Managed Care – PPO

## 2015-12-30 ENCOUNTER — Encounter: Payer: Self-pay | Admitting: Hematology and Oncology

## 2015-12-30 ENCOUNTER — Telehealth: Payer: Self-pay | Admitting: Hematology and Oncology

## 2015-12-30 ENCOUNTER — Telehealth: Payer: Self-pay | Admitting: *Deleted

## 2015-12-30 ENCOUNTER — Ambulatory Visit: Payer: BC Managed Care – PPO

## 2015-12-30 ENCOUNTER — Other Ambulatory Visit (HOSPITAL_BASED_OUTPATIENT_CLINIC_OR_DEPARTMENT_OTHER): Payer: BC Managed Care – PPO

## 2015-12-30 ENCOUNTER — Ambulatory Visit (HOSPITAL_BASED_OUTPATIENT_CLINIC_OR_DEPARTMENT_OTHER): Payer: BC Managed Care – PPO | Admitting: Hematology and Oncology

## 2015-12-30 VITALS — BP 117/60 | HR 83 | Temp 98.1°F | Resp 18 | Ht 63.0 in | Wt 201.7 lb

## 2015-12-30 VITALS — BP 120/80 | HR 86 | Temp 98.4°F | Resp 16

## 2015-12-30 DIAGNOSIS — D61818 Other pancytopenia: Secondary | ICD-10-CM

## 2015-12-30 DIAGNOSIS — C563 Malignant neoplasm of bilateral ovaries: Secondary | ICD-10-CM

## 2015-12-30 DIAGNOSIS — C561 Malignant neoplasm of right ovary: Secondary | ICD-10-CM

## 2015-12-30 DIAGNOSIS — D709 Neutropenia, unspecified: Secondary | ICD-10-CM

## 2015-12-30 DIAGNOSIS — C562 Malignant neoplasm of left ovary: Secondary | ICD-10-CM | POA: Diagnosis not present

## 2015-12-30 DIAGNOSIS — E860 Dehydration: Secondary | ICD-10-CM

## 2015-12-30 DIAGNOSIS — T451X5A Adverse effect of antineoplastic and immunosuppressive drugs, initial encounter: Secondary | ICD-10-CM

## 2015-12-30 DIAGNOSIS — R5081 Fever presenting with conditions classified elsewhere: Secondary | ICD-10-CM

## 2015-12-30 DIAGNOSIS — K1231 Oral mucositis (ulcerative) due to antineoplastic therapy: Secondary | ICD-10-CM

## 2015-12-30 DIAGNOSIS — G620 Drug-induced polyneuropathy: Secondary | ICD-10-CM

## 2015-12-30 DIAGNOSIS — D649 Anemia, unspecified: Secondary | ICD-10-CM

## 2015-12-30 LAB — COMPREHENSIVE METABOLIC PANEL
ALT: 33 U/L (ref 0–55)
ANION GAP: 11 meq/L (ref 3–11)
AST: 20 U/L (ref 5–34)
Albumin: 3 g/dL — ABNORMAL LOW (ref 3.5–5.0)
Alkaline Phosphatase: 101 U/L (ref 40–150)
BUN: 6.5 mg/dL — ABNORMAL LOW (ref 7.0–26.0)
CALCIUM: 8.1 mg/dL — AB (ref 8.4–10.4)
CHLORIDE: 107 meq/L (ref 98–109)
CO2: 24 mEq/L (ref 22–29)
CREATININE: 0.6 mg/dL (ref 0.6–1.1)
Glucose: 141 mg/dl — ABNORMAL HIGH (ref 70–140)
POTASSIUM: 3.6 meq/L (ref 3.5–5.1)
Sodium: 142 mEq/L (ref 136–145)
Total Bilirubin: 0.72 mg/dL (ref 0.20–1.20)
Total Protein: 6.6 g/dL (ref 6.4–8.3)

## 2015-12-30 LAB — CBC WITH DIFFERENTIAL/PLATELET
BASO%: 0.4 % (ref 0.0–2.0)
BASOS ABS: 0 10*3/uL (ref 0.0–0.1)
EOS%: 0.1 % (ref 0.0–7.0)
Eosinophils Absolute: 0 10*3/uL (ref 0.0–0.5)
HEMATOCRIT: 19.6 % — AB (ref 34.8–46.6)
HGB: 6.5 g/dL — CL (ref 11.6–15.9)
LYMPH#: 0.5 10*3/uL — AB (ref 0.9–3.3)
LYMPH%: 9.5 % — AB (ref 14.0–49.7)
MCH: 32.8 pg (ref 25.1–34.0)
MCHC: 32.9 g/dL (ref 31.5–36.0)
MCV: 99.5 fL (ref 79.5–101.0)
MONO#: 0.5 10*3/uL (ref 0.1–0.9)
MONO%: 8.4 % (ref 0.0–14.0)
NEUT#: 4.4 10*3/uL (ref 1.5–6.5)
NEUT%: 81.6 % — AB (ref 38.4–76.8)
PLATELETS: 47 10*3/uL — AB (ref 145–400)
RBC: 1.97 10*6/uL — AB (ref 3.70–5.45)
RDW: 22.9 % — ABNORMAL HIGH (ref 11.2–14.5)
WBC: 5.4 10*3/uL (ref 3.9–10.3)

## 2015-12-30 LAB — PREPARE RBC (CROSSMATCH)

## 2015-12-30 MED ORDER — SODIUM CHLORIDE 0.9 % IV SOLN: Freq: Once | INTRAVENOUS | Status: DC

## 2015-12-30 MED ORDER — SODIUM CHLORIDE 0.9 % IV SOLN
Freq: Once | INTRAVENOUS | Status: AC
  Administered 2015-12-30: 11:00:00 via INTRAVENOUS

## 2015-12-30 MED ORDER — OXYCODONE HCL 10 MG PO TABS: 10.0000 mg | ORAL_TABLET | ORAL | 0 refills | Status: DC | PRN

## 2015-12-30 MED ORDER — HYDROMORPHONE HCL 4 MG/ML IJ SOLN
INTRAMUSCULAR | Status: AC
  Filled 2015-12-30: qty 1

## 2015-12-30 MED ORDER — HYDROMORPHONE HCL 4 MG/ML IJ SOLN
2.0000 mg | INTRAMUSCULAR | Status: DC | PRN
  Administered 2015-12-30: 2 mg via INTRAVENOUS

## 2015-12-30 MED ORDER — HEPARIN SOD (PORK) LOCK FLUSH 100 UNIT/ML IV SOLN
250.0000 [IU] | INTRAVENOUS | Status: AC | PRN
  Administered 2015-12-30: 250 [IU]
  Filled 2015-12-30: qty 5

## 2015-12-30 MED ORDER — HYDROMORPHONE HCL 4 MG/ML IJ SOLN
2.0000 mg | INTRAMUSCULAR | Status: DC | PRN
  Administered 2015-12-30 (×2): 2 mg via INTRAVENOUS

## 2015-12-30 MED ORDER — SODIUM CHLORIDE 0.9% FLUSH
10.0000 mL | INTRAVENOUS | Status: DC | PRN
  Filled 2015-12-30: qty 10

## 2015-12-30 MED ORDER — ACETAMINOPHEN 325 MG PO TABS
ORAL_TABLET | ORAL | Status: AC
  Filled 2015-12-30: qty 2

## 2015-12-30 MED ORDER — SODIUM CHLORIDE 0.9% FLUSH
10.0000 mL | INTRAVENOUS | Status: AC | PRN
  Administered 2015-12-30: 10 mL via INTRAVENOUS
  Filled 2015-12-30: qty 10

## 2015-12-30 MED ORDER — ONDANSETRON HCL 4 MG/2ML IJ SOLN
INTRAMUSCULAR | Status: AC
  Filled 2015-12-30: qty 4

## 2015-12-30 MED ORDER — DIPHENHYDRAMINE HCL 25 MG PO CAPS
ORAL_CAPSULE | ORAL | Status: AC
  Filled 2015-12-30: qty 1

## 2015-12-30 MED ORDER — ONDANSETRON HCL 4 MG/2ML IJ SOLN
8.0000 mg | Freq: Once | INTRAMUSCULAR | Status: AC
  Administered 2015-12-30: 8 mg via INTRAVENOUS

## 2015-12-30 MED ORDER — ACETAMINOPHEN 325 MG PO TABS
650.0000 mg | ORAL_TABLET | Freq: Once | ORAL | Status: AC
  Administered 2015-12-30: 650 mg via ORAL

## 2015-12-30 MED ORDER — SODIUM CHLORIDE 0.9 % IV SOLN
250.0000 mL | Freq: Once | INTRAVENOUS | Status: AC
  Administered 2015-12-30: 250 mL via INTRAVENOUS

## 2015-12-30 MED ORDER — HEPARIN SOD (PORK) LOCK FLUSH 100 UNIT/ML IV SOLN
250.0000 [IU] | Freq: Once | INTRAVENOUS | Status: DC | PRN
  Filled 2015-12-30: qty 5

## 2015-12-30 MED ORDER — SODIUM CHLORIDE 0.9% FLUSH
10.0000 mL | INTRAVENOUS | Status: AC | PRN
  Administered 2015-12-30: 10 mL
  Filled 2015-12-30: qty 10

## 2015-12-30 MED ORDER — DIPHENHYDRAMINE HCL 25 MG PO CAPS
25.0000 mg | ORAL_CAPSULE | Freq: Once | ORAL | Status: AC
  Administered 2015-12-30: 25 mg via ORAL

## 2015-12-30 NOTE — Progress Notes (Signed)
Pt. coughing with small amount of pinkish sputum toward the end of blood administration. Pt reports that she's had bronchitis, with coughing for the past week. Notified Selena Lesser NP, who advised to pt notify MD if symptoms continue.

## 2015-12-30 NOTE — Assessment & Plan Note (Signed)
She has severe mucositis pain and bone pain. I refill her prescription oxycodone to take as needed. She found helpful.

## 2015-12-30 NOTE — Telephone Encounter (Signed)
Pt asks if Dr. Alvy Bimler would give her Rx for Oral Dilaudid to take at home?  She felt it worked really well today and feels less "loopy" on it than the oxycodone she currently has at home.   Informed pt Dr. Alvy Bimler has left office for the day and Nurse will ask Dr. Alvy Bimler and call her back tomorrow.

## 2015-12-30 NOTE — Assessment & Plan Note (Signed)
She had recent neutropenic fever and urine culture was positive. She completed a course of antibiotic treatment. Neutropenia has resolved

## 2015-12-30 NOTE — Telephone Encounter (Signed)
Appointments scheduled per 12/30/15 los. A copy of the appointment schedule was given to the patient, per 12/30/15 los.  °

## 2015-12-30 NOTE — Patient Instructions (Signed)

## 2015-12-30 NOTE — Assessment & Plan Note (Signed)
She has complained of weakness, dehydration, dizziness with shortness of breath on exertion likely due to severe anemia and recent poor oral intake. I will give her blood transfusion as above and give her IV fluids for dehydration

## 2015-12-30 NOTE — Patient Instructions (Signed)
Blood Transfusion , Adult A blood transfusion is a procedure in which you receive donated blood, including plasma, platelets, and red blood cells, through an IV tube. You may need a blood transfusion because of illness, surgery, or injury. The blood may come from a donor. You may also be able to donate blood for yourself (autologous blood donation) before a surgery if you know that you might require a blood transfusion. The blood given in a transfusion is made up of different types of cells. You may receive:  Red blood cells. These carry oxygen to the cells in the body.  White blood cells. These help you fight infections.  Platelets. These help your blood to clot.  Plasma. This is the liquid part of your blood and it helps with fluid imbalances. If you have hemophilia or another clotting disorder, you may also receive other types of blood products. Tell a health care provider about:  Any allergies you have.  All medicines you are taking, including vitamins, herbs, eye drops, creams, and over-the-counter medicines.  Any problems you or family members have had with anesthetic medicines.  Any blood disorders you have.  Any surgeries you have had.  Any medical conditions you have, including any recent fever or cold symptoms.  Whether you are pregnant or may be pregnant.  Any previous reactions you have had during a blood transfusion. What are the risks? Generally, this is a safe procedure. However, problems may occur, including:  Having an allergic reaction to something in the donated blood. Hives and itching may be symptoms of this type of reaction.  Fever. This may be a reaction to the white blood cells in the transfused blood. Nausea or chest pain may accompany a fever.  Iron overload. This can happen from having many transfusions.  Transfusion-related acute lung injury (TRALI). This is a rare reaction that causes lung damage. The cause is not known.TRALI can occur within hours  of a transfusion or several days later.  Sudden (acute) or delayed hemolytic reactions. This happens if your blood does not match the cells in your transfusion. Your body's defense system (immune system) may try to attack the new cells. This complication is rare. The symptoms include fever, chills, nausea, and low back pain or chest pain.  Infection or disease transmission. This is rare. What happens before the procedure?  You will have a blood test to determine your blood type. This is necessary to know what kind of blood your body will accept and to match it to the donor blood.  If you are going to have a planned surgery, you may be able to do an autologous blood donation. This may be done in case you need to have a transfusion.  If you have had an allergic reaction to a transfusion in the past, you may be given medicine to help prevent a reaction. This medicine may be given to you by mouth or through an IV tube.  You will have your temperature, blood pressure, and pulse monitored before the transfusion.  Follow instructions from your health care provider about eating and drinking restrictions.  Ask your health care provider about:  Changing or stopping your regular medicines. This is especially important if you are taking diabetes medicines or blood thinners.  Taking medicines such as aspirin and ibuprofen. These medicines can thin your blood. Do not take these medicines before your procedure if your health care provider instructs you not to. What happens during the procedure?  An IV tube will be   inserted into one of your veins.  The bag of donated blood will be attached to your IV tube. The blood will then enter through your vein.  Your temperature, blood pressure, and pulse will be monitored regularly during the transfusion. This monitoring is done to detect early signs of a transfusion reaction.  If you have any signs or symptoms of a reaction, your transfusion will be stopped and  you may be given medicine.  When the transfusion is complete, your IV tube will be removed.  Pressure may be applied to the IV site for a few minutes.  A bandage (dressing) will be applied. The procedure may vary among health care providers and hospitals. What happens after the procedure?  Your temperature, blood pressure, heart rate, breathing rate, and blood oxygen level will be monitored often.  Your blood may be tested to see how you are responding to the transfusion.  You may be warmed with fluids or blankets to maintain a normal body temperature. Summary  A blood transfusion is a procedure in which you receive donated blood, including plasma, platelets, and red blood cells, through an IV tube.  Your temperature, blood pressure, and pulse will be monitored before, during, and after the transfusion.  Your blood may be tested after the transfusion to see how your body has responded. This information is not intended to replace advice given to you by your health care provider. Make sure you discuss any questions you have with your health care provider. Document Released: 01/03/2000 Document Revised: 10/03/2015 Document Reviewed: 10/03/2015 Elsevier Interactive Patient Education  2017 [employer]  

## 2015-12-30 NOTE — Assessment & Plan Note (Signed)
The patient completed 6 cycles of adjuvant treatment through [facility]. She is recovering from her side effects of treatment. I will get assistance from cancer survivorship clinic to arrange for appointment here. In the meantime, we discussed future follow-up. Her oncologist at [facility] recommended repeat CT scan at the end of the month. I felt that this is too soon from her recent CT scan. She probably would not need repeat imaging study for at least 3-6 months from her last CT scan. I will also ask on her behalf to see if she can be seen locally rather than traveling all the way to [facility]. She agreed I will also get her seen by cancer survivorship clinic in the near future

## 2015-12-30 NOTE — Progress Notes (Signed)
Patient was identified to be at risk for malnutrition on the MST secondary to weight loss and poor appetite.  Patient is a 35 year old female diagnosed with ovarian cancer.  She is a patient of Dr. Alvy Bimler.  Past medical history includes heart attack.  Medications include Xanax, Adderall, Lipitor, B complex and Decadron.  Labs were reviewed.  Height: 5 feet 3 inches. Weight: 201.7 pounds. Usual body weight 190 pounds. BMI: 34.84.  Patient had progressive weight loss, which is now improved. She reports she continues to have nausea and vomiting. She has severe mucositis. Reports bouts of diarrhea.  Nutrition diagnosis:  Food and nutrition related knowledge deficit related to ovarian cancer and associated treatments as evidenced by no prior need for nutrition related information.  Intervention: Patient was educated on strategies for eating if she has nausea, vomiting.  Encouraged patient to take nausea medications as prescribed. Encouraged patient to rinse mouth with salt and baking soda and eat soft moist foods. Encouraged 6 small meals/snacks daily. Patient reports she drinks [employer] breakfast essentials.  I provided her with coupons. Reviewed high protein foods with patient and provided fact sheet. Questions were answered.  Teach back method used.  Contact information was given.  Monitoring, evaluation, goals: Patient will tolerate adequate calories and protein for minimal weight loss.  Next visit: Patient will contact me with questions.  **Disclaimer: This note was dictated with voice recognition software. Similar sounding words can inadvertently be transcribed and this note may contain transcription errors which may not have been corrected upon publication of note.**

## 2015-12-30 NOTE — Progress Notes (Signed)
[place] OFFICE PROGRESS NOTE  Patient Care Team: Provider Not In System as PCP - General  SUMMARY OF ONCOLOGIC HISTORY:   Ovarian cancer ([place])   07/13/2014 Initial Diagnosis    Cancer of ovary, IIIA      07/13/2014 Surgery    RSO, staging       Ovarian cancer, bilateral ([place])   06/19/2014 Imaging    Complex cystic mass is identified within the pelvis. This is likely arising from the right ovary. The cyst is complicated by internal areas of septation and large dystrophic calcifications. This likely represents an ovarian tumor. 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 No macroscopic fat however is identified. Surgical evaluation is recommended. 2. Nephrolithiasis. 3. Left kidney scarring.      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 (please see synoptic report and comment)  B:Pelvic adhesion, excision - Involved by serous carcinoma, largest focus measures 0.7 cm  C:Omentum, omentectomy - Involved by serous carcinoma, focus measures 0.3 cm   D:Peritoneum, pelvic nodule, excision  - No serous carcinoma identified - Fibroadipose tissue with chronic inflammation and focal calcification  Extensive lymph node dissection showed no evidence of lymph node involvement      07/15/2014 Surgery    She underwent SALPINGO-OOPHORECTOMY, COMPLETE OR PARTIAL, UNILATERAL OR BILATERAL (SEPARATE PROCEDURE) OMENTECTOMY, EPIPLOECTOMY, RESECTION OF OMENTUM LIMITED LYMPHADENECTOMY FOR STAGING (SEPARATE PROCEDURE); PELVIC AND PARA-AORTIC URETEROLYSIS, WITH OR WITHOUT REPOSITIONING OF URETER FOR RETROPERITONEAL FIBROSIS          08/10/2014 - 12/10/2014 Chemotherapy    She received 6 cycles of adjuvant treatment with carboplatin and Taxol       12/24/2014  Imaging    Outside CT scan of the abdomen and pelvis show no evidence of residual disease      07/19/2015 Pathology Results    Outside pathology: Left ovary - Invasive high grade serous carcinoma with associated psammoma bodies in a background of serous borderline tumor, 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) (see comment) - Largest invasive focus measures 0.6 cm (overall tumor is 3.9 cm) - Surface of the ovary is involved - Background follicular cysts, some hemorrhagic  Left fallopian tube - No carcinoma identified - Unremarkable fallopian tube with paratubal serous cysts - Fallopian tube adherent to left ovary      07/19/2015 Surgery    She underwent tobotic Laparoscopy, Surgical, With Total Hysterectomy, For Uterus 250 G Or Less; W/Removal Tube(S) And/Or Ovary(S)         08/12/2015 - 12/16/2015 Chemotherapy    She received adjuvant carbo/taxol through [facility] x 6 cycles       08/20/2015 Miscellaneous    She was evaluated by genetics testing and results are negative      11/28/2015 - 12/02/2015 Hospital Admission    She was admitted to the hospital with neutropenic sepsis and severe pancytopenia.      12/02/2015 Imaging    Focal area of altered perfusion in the interpolar right kidney. Pyelonephritis would be a consideration. 2. Nonobstructing left renal stones with associated scarring. 3. Stable appearance right renal cysts.       INTERVAL HISTORY: Please see below for problem oriented charting. She is here today accompanied by her mother. She has poor oral intake over the weekend. She complained of dizziness, shortness of breath on exertion, profoundly weak with  mild nausea. She denies constipation. Had recent loose bowel movement but not diarrhea. She had diffuse bone pain. Denies dysuria, frequency or urgency. She mild cough. Her mucositis pain is stable on pain medicine REVIEW OF SYSTEMS:   Constitutional: Denies fevers, chills or abnormal  weight loss Eyes: Denies blurriness of vision Cardiovascular: Denies palpitation, chest discomfort or lower extremity swelling Skin: Denies abnormal skin rashes Lymphatics: Denies new lymphadenopathy or easy bruising Neurological:Denies numbness, tingling Behavioral/Psych: Mood is stable, no new changes  All other systems were reviewed with the patient and are negative.  I have reviewed the past medical history, past surgical history, social history and family history with the patient and they are unchanged from previous note.  ALLERGIES:  is allergic to lisinopril.  MEDICATIONS:  Current Outpatient Prescriptions  Medication Sig Dispense Refill  . acetaminophen (TYLENOL) 500 MG tablet Take 1,000 mg by mouth every 6 (six) hours as needed for mild pain, moderate pain, fever or headache.    . albuterol (PROVENTIL HFA;VENTOLIN HFA) 108 (90 BASE) MCG/ACT inhaler Inhale 2 puffs into the lungs every 6 (six) hours as needed for wheezing or shortness of breath.    . ALPRAZolam (XANAX) 1 MG tablet Take 1 mg by mouth 3 (three) times daily as needed for anxiety.    Marland Kitchen amoxicillin-clavulanate (AUGMENTIN) 875-125 MG tablet Take 1 tablet by mouth 2 (two) times daily. 14 tablet 0  . amphetamine-dextroamphetamine (ADDERALL XR) 30 MG 24 hr capsule Take 30 mg by mouth daily with breakfast. Only takes when she is at school    . amphetamine-dextroamphetamine (ADDERALL) 20 MG tablet Take 20 mg by mouth daily with lunch. Only takes when she is at school    . aspirin 81 MG chewable tablet Chew 81 mg by mouth daily with breakfast.     . atorvastatin (LIPITOR) 40 MG tablet Take 40 mg by mouth at bedtime.     Marland Kitchen b complex vitamins tablet Take 1 tablet by mouth daily.    . cetirizine (ZYRTEC) 10 MG tablet Take 10 mg by mouth daily with breakfast.     . clonazePAM (KLONOPIN) 1 MG tablet TK 1 T PO BID PRA DO NOT TAKE WITH ALPRAZOLAM  0  . dexamethasone (DECADRON) 4 MG tablet Take 8 mg by mouth daily with breakfast. Pt  only takes this medication on days 2,3, and 4 of chemo.    Marland Kitchen HYDROcodone-homatropine (HYCODAN) 5-1.5 MG/5ML syrup Take 5 mLs by mouth every 6 (six) hours as needed for cough. (Patient not taking: Reported on 12/23/2015) 180 mL 0  . ibuprofen (ADVIL,MOTRIN) 200 MG tablet Take 400 mg by mouth every 6 (six) hours as needed for fever, headache, mild pain, moderate pain or cramping.    . lidocaine-prilocaine (EMLA) cream Apply 1 application topically as needed. 30 g 6  . losartan (COZAAR) 50 MG tablet Take 100 mg by mouth daily with breakfast.     . magic mouthwash w/lidocaine SOLN Take 10 mLs by mouth 4 (four) times daily as needed for mouth pain. 480 mL 0  . magnesium oxide (MAGNESIUM-OXIDE) 400 (241.3 Mg) MG tablet Take 400 mg by mouth 2 (two) times daily.    . metoprolol succinate (TOPROL-XL) 100 MG 24 hr tablet Take 100 mg by mouth daily with breakfast. Take with or immediately following a meal.     . Multiple Vitamin (MULTIVITAMIN WITH MINERALS) TABS tablet Take 1 tablet by mouth daily with breakfast.     . nitroGLYCERIN (NITROSTAT) 0.4 MG SL tablet Place 0.4  mg under the tongue every 5 (five) minutes as needed for chest pain.    . Omega-3 Fatty Acids (FISH OIL PO) Take 1 capsule by mouth daily.    Marland Kitchen oxyCODONE (OXY IR/ROXICODONE) 5 MG immediate release tablet Take 5 mg by mouth every 6 (six) hours as needed for severe pain (Only takes during chemo).     . Oxycodone HCl 10 MG TABS Take 1 tablet (10 mg total) by mouth every 4 (four) hours as needed. 90 tablet 0  . phenazopyridine (PYRIDIUM) 200 MG tablet Take 1 tablet (200 mg total) by mouth 3 (three) times daily. (Patient not taking: Reported on 12/23/2015) 6 tablet 0  . potassium chloride SA (K-DUR,KLOR-CON) 20 MEQ tablet Take 20 mEq by mouth daily with breakfast.    . prasugrel (EFFIENT) 10 MG TABS tablet Take 10 mg by mouth daily with breakfast.     . pregabalin (LYRICA) 25 MG capsule Take 1 capsule (25 mg total) by mouth 2 (two) times daily.  (Patient not taking: Reported on 12/23/2015) 30 capsule 0  . venlafaxine XR (EFFEXOR-XR) 75 MG 24 hr capsule Take 75 mg by mouth.     Current Facility-Administered Medications  Medication Dose Route Frequency Provider Last Rate Last Dose  . heparin lock flush 100 unit/mL  250 Units Intracatheter Once PRN Heath Lark, MD      . HYDROmorphone (DILAUDID) injection 2 mg  2 mg Intravenous Q2H PRN Heath Lark, MD   2 mg at 12/30/15 1108  . ondansetron (ZOFRAN) injection 8 mg  8 mg Intravenous Once Heath Lark, MD      . sodium chloride flush (NS) 0.9 % injection 10 mL  10 mL Intracatheter PRN Heath Lark, MD       Facility-Administered Medications Ordered in Other Visits  Medication Dose Route Frequency Provider Last Rate Last Dose  . sodium chloride flush (NS) 0.9 % injection 10 mL  10 mL Intravenous PRN Nicholas Lose, MD   10 mL at 12/30/15 1022    PHYSICAL EXAMINATION: ECOG PERFORMANCE STATUS: 1 - Symptomatic but completely ambulatory  Vitals:   12/30/15 1059  BP: 117/60  Pulse: 83  Resp: 18  Temp: 98.1 F (36.7 C)   Filed Weights   12/30/15 1059  Weight: 201 lb 11.2 oz (91.5 kg)    GENERAL:alert, no distress and comfortable. She appears tearful SKIN: skin color is pale, texture, turgor are normal, no rashes or significant lesions EYES: normal, Conjunctiva are pale and non-injected, sclera clear OROPHARYNX:no exudate, no erythema and lips, buccal mucosa, and tongue normal . No thrush NECK: supple, thyroid normal size, non-tender, without nodularity LYMPH:  no palpable lymphadenopathy in the cervical, axillary or inguinal LUNGS: clear to auscultation and percussion with normal breathing effort HEART: regular rate & rhythm and no murmurs and no lower extremity edema ABDOMEN:abdomen soft, non-tender and normal bowel sounds Musculoskeletal:no cyanosis of digits and no clubbing  NEURO: alert & oriented x 3 with fluent speech, no focal motor/sensory deficits  LABORATORY DATA:  I have  reviewed the data as listed    Component Value Date/Time   NA 142 12/30/2015 1010   K 3.6 12/30/2015 1010   CL 104 12/05/2015 0123   CO2 24 12/30/2015 1010   GLUCOSE 141 (H) 12/30/2015 1010   BUN 6.5 (L) 12/30/2015 1010   CREATININE 0.6 12/30/2015 1010   CALCIUM 8.1 (L) 12/30/2015 1010   PROT 6.6 12/30/2015 1010   ALBUMIN 3.0 (L) 12/30/2015 1010   AST 20 12/30/2015 1010  ALT 33 12/30/2015 1010   ALKPHOS 101 12/30/2015 1010   BILITOT 0.72 12/30/2015 1010   GFRNONAA >60 12/05/2015 0123   GFRAA >60 12/05/2015 0123    No results found for: SPEP, UPEP  Lab Results  Component Value Date   WBC 5.4 12/30/2015   NEUTROABS 4.4 12/30/2015   HGB 6.5 (LL) 12/30/2015   HCT 19.6 (L) 12/30/2015   MCV 99.5 12/30/2015   PLT 47 (L) 12/30/2015      Chemistry      Component Value Date/Time   NA 142 12/30/2015 1010   K 3.6 12/30/2015 1010   CL 104 12/05/2015 0123   CO2 24 12/30/2015 1010   BUN 6.5 (L) 12/30/2015 1010   CREATININE 0.6 12/30/2015 1010      Component Value Date/Time   CALCIUM 8.1 (L) 12/30/2015 1010   ALKPHOS 101 12/30/2015 1010   AST 20 12/30/2015 1010   ALT 33 12/30/2015 1010   BILITOT 0.72 12/30/2015 1010       RADIOGRAPHIC STUDIES: I have personally reviewed the radiological images as listed and agreed with the findings in the report. Dg Chest 2 View  Result Date: 12/23/2015 CLINICAL DATA:  Cough. Neutropenia. Chest pain and shortness of breath for 5 days. History of ovarian cancer. EXAM: CHEST  2 VIEW COMPARISON:  11/30/2015 FINDINGS: Right jugular Port-A-Cath remains in place with tip overlying the SVC. Cardiomediastinal silhouette is within normal limits. No airspace consolidation, edema, pleural effusion, or pneumothorax is identified. No acute osseous abnormality is seen. IMPRESSION: No active cardiopulmonary disease. Electronically Signed   By: Logan Bores M.D.   On: 12/23/2015 09:59   Ct Abdomen Pelvis W Contrast  Result Date: 12/02/2015 CLINICAL  DATA:  Ovarian cancer. Increased weakness. Nectar and fever. EXAM: CT ABDOMEN AND PELVIS WITH CONTRAST TECHNIQUE: Multidetector CT imaging of the abdomen and pelvis was performed using the standard protocol following bolus administration of intravenous contrast. CONTRAST:  142mL ISOVUE-300 IOPAMIDOL (ISOVUE-300) INJECTION 61%, 23mL ISOVUE-300 IOPAMIDOL (ISOVUE-300) INJECTION 61% COMPARISON:  06/19/2014 FINDINGS: Lower chest:  Unremarkable. Hepatobiliary: No focal abnormality within the liver parenchyma. There is no evidence for gallstones, gallbladder wall thickening, or pericholecystic fluid. No intrahepatic or extrahepatic biliary dilation. Pancreas: No focal mass lesion. No dilatation of the main duct. No intraparenchymal cyst. No peripancreatic edema. Spleen: No splenomegaly. No focal mass lesion. Adrenals/Urinary Tract: Multiple areas of scarring are noted in the left kidney with multiple nonobstructing left renal stones, as before. Dominant stone left kidney is an interpolar posterior 6 mm stone. 2.1 cm cyst upper pole right kidney was 1.7 cm previously. 11 mm cyst identified lower pole right kidney subtle area of hypoperfusion is identified in the interpolar right kidney on today's study, nonspecific, but does raise the question of pyelonephritis. No evidence for hydronephrosis. The urinary bladder appears normal for the degree of distention. Stomach/Bowel: Stomach is nondistended. No gastric wall thickening. No evidence of outlet obstruction. Duodenum is normally positioned as is the ligament of Treitz. No small bowel wall thickening. No small bowel dilatation. The terminal ileum is normal. The appendix is normal. No gross colonic mass. No colonic wall thickening. No substantial diverticular change. Vascular/Lymphatic: There is abdominal aortic atherosclerosis without aneurysm. There is no gastrohepatic or hepatoduodenal ligament lymphadenopathy. No intraperitoneal or retroperitoneal lymphadenopathy. No  pelvic sidewall lymphadenopathy. Reproductive: Uterus surgically absent.  There is no adnexal mass. Other: No intraperitoneal free fluid. Musculoskeletal: Right paraumbilical hernia contains a loop of transverse colon without complicating features. Bone windows reveal no worrisome lytic  or sclerotic osseous lesions. IMPRESSION: 1. Focal area of altered perfusion in the interpolar right kidney. Pyelonephritis would be a consideration. 2. Nonobstructing left renal stones with associated scarring. 3. Stable appearance right renal cysts. Electronically Signed   By: Misty Stanley M.D.   On: 12/02/2015 15:43    ASSESSMENT & PLAN:  Ovarian cancer, bilateral [facility]) The patient completed 6 cycles of adjuvant treatment through [facility]. She is recovering from her side effects of treatment. I will get assistance from cancer survivorship clinic to arrange for appointment here. In the meantime, we discussed future follow-up. Her oncologist at [facility] recommended repeat CT scan at the end of the month. I felt that this is too soon from her recent CT scan. She probably would not need repeat imaging study for at least 3-6 months from her last CT scan. I will also ask on her behalf to see if she can be seen locally rather than traveling all the way to [facility]. She agreed I will also get her seen by cancer survivorship clinic in the near future   Neutropenia with fever ([place]) She had recent neutropenic fever and urine culture was positive. She completed a course of antibiotic treatment. Neutropenia has resolved  Peripheral neuropathy due to chemotherapy [facility]) We discussed some of the risks, benefits, and alternatives of blood transfusions. The patient is symptomatic from anemia and the hemoglobin level is critically low.  Some of the side-effects to be expected including risks of transfusion reactions, chills, infection, syndrome of volume overload and risk of hospitalization from various reasons and the  patient is willing to proceed and went ahead to sign consent today. I will give her 2 units of blood. Since she has no bleeding complication, she does not need platelet transfusion  Mucositis due to antineoplastic therapy She has severe mucositis pain and bone pain. I refill her prescription oxycodone to take as needed. She found helpful.  Dehydration She has complained of weakness, dehydration, dizziness with shortness of breath on exertion likely due to severe anemia and recent poor oral intake. I will give her blood transfusion as above and give her IV fluids for dehydration   No orders of the defined types were placed in this encounter.  All questions were answered. The patient knows to call the clinic with any problems, questions or concerns. No barriers to learning was detected. I spent 30 minutes counseling the patient face to face. The total time spent in the appointment was 40 minutes and more than 50% was on counseling and review of test results     Heath Lark, MD 12/30/2015 11:14 AM

## 2015-12-30 NOTE — Assessment & Plan Note (Signed)
We discussed some of the risks, benefits, and alternatives of blood transfusions. The patient is symptomatic from anemia and the hemoglobin level is critically low.  Some of the side-effects to be expected including risks of transfusion reactions, chills, infection, syndrome of volume overload and risk of hospitalization from various reasons and the patient is willing to proceed and went ahead to sign consent today. I will give her 2 units of blood. Since she has no bleeding complication, she does not need platelet transfusion

## 2015-12-31 ENCOUNTER — Other Ambulatory Visit: Payer: Self-pay | Admitting: Hematology and Oncology

## 2015-12-31 LAB — TYPE AND SCREEN
ABO/RH(D): A POS
ANTIBODY SCREEN: NEGATIVE
Unit division: 0
Unit division: 0

## 2015-12-31 MED ORDER — HYDROMORPHONE HCL 4 MG PO TABS: 4.0000 mg | ORAL_TABLET | ORAL | 0 refills | Status: DC | PRN

## 2015-12-31 MED FILL — HYDROmorphone HCL 4 MG TABS: 4 | 10 days supply | Qty: 60 | Fill #0

## 2015-12-31 NOTE — Telephone Encounter (Signed)
Pt says her mother dropped Rx off for oxycodone but they have not picked it up yet. She would rather have the dilaudid Rx,  Thinks it works better.   Her mother will come by our office to pick up the Rx for dilaudid and we will cancel the Oxycodone.  I called [facility] and asked them to cancel/void the Rx for Oxycodone and wait for new Rx for dilaudid.   They will cancel the order/rx.

## 2015-12-31 NOTE — Telephone Encounter (Signed)
I'm OK as long as she has not refilled the oxycodone prescription

## 2016-01-02 ENCOUNTER — Other Ambulatory Visit: Payer: BC Managed Care – PPO

## 2016-01-06 ENCOUNTER — Telehealth: Payer: Self-pay | Admitting: Hematology and Oncology

## 2016-01-06 ENCOUNTER — Ambulatory Visit (HOSPITAL_BASED_OUTPATIENT_CLINIC_OR_DEPARTMENT_OTHER): Payer: BC Managed Care – PPO

## 2016-01-06 ENCOUNTER — Other Ambulatory Visit (HOSPITAL_BASED_OUTPATIENT_CLINIC_OR_DEPARTMENT_OTHER): Payer: BC Managed Care – PPO

## 2016-01-06 ENCOUNTER — Ambulatory Visit (HOSPITAL_BASED_OUTPATIENT_CLINIC_OR_DEPARTMENT_OTHER): Payer: BC Managed Care – PPO | Admitting: Hematology and Oncology

## 2016-01-06 VITALS — BP 135/73 | HR 98 | Temp 98.3°F | Resp 18 | Wt 200.4 lb

## 2016-01-06 DIAGNOSIS — D61818 Other pancytopenia: Secondary | ICD-10-CM

## 2016-01-06 DIAGNOSIS — R6889 Other general symptoms and signs: Secondary | ICD-10-CM

## 2016-01-06 DIAGNOSIS — Z452 Encounter for adjustment and management of vascular access device: Secondary | ICD-10-CM | POA: Diagnosis not present

## 2016-01-06 DIAGNOSIS — D709 Neutropenia, unspecified: Secondary | ICD-10-CM

## 2016-01-06 DIAGNOSIS — C563 Malignant neoplasm of bilateral ovaries: Secondary | ICD-10-CM

## 2016-01-06 DIAGNOSIS — C561 Malignant neoplasm of right ovary: Secondary | ICD-10-CM

## 2016-01-06 DIAGNOSIS — D61810 Antineoplastic chemotherapy induced pancytopenia: Secondary | ICD-10-CM

## 2016-01-06 DIAGNOSIS — C562 Malignant neoplasm of left ovary: Secondary | ICD-10-CM

## 2016-01-06 DIAGNOSIS — T451X5A Adverse effect of antineoplastic and immunosuppressive drugs, initial encounter: Secondary | ICD-10-CM

## 2016-01-06 DIAGNOSIS — R5081 Fever presenting with conditions classified elsewhere: Secondary | ICD-10-CM

## 2016-01-06 DIAGNOSIS — G620 Drug-induced polyneuropathy: Secondary | ICD-10-CM | POA: Diagnosis not present

## 2016-01-06 DIAGNOSIS — K1231 Oral mucositis (ulcerative) due to antineoplastic therapy: Secondary | ICD-10-CM

## 2016-01-06 DIAGNOSIS — R059 Cough, unspecified: Secondary | ICD-10-CM | POA: Insufficient documentation

## 2016-01-06 DIAGNOSIS — R05 Cough: Secondary | ICD-10-CM

## 2016-01-06 LAB — COMPREHENSIVE METABOLIC PANEL
ALT: 22 U/L (ref 0–55)
ANION GAP: 10 meq/L (ref 3–11)
AST: 18 U/L (ref 5–34)
Albumin: 3.6 g/dL (ref 3.5–5.0)
Alkaline Phosphatase: 92 U/L (ref 40–150)
BUN: 13.7 mg/dL (ref 7.0–26.0)
CALCIUM: 8.6 mg/dL (ref 8.4–10.4)
CHLORIDE: 104 meq/L (ref 98–109)
CO2: 25 mEq/L (ref 22–29)
Creatinine: 0.6 mg/dL (ref 0.6–1.1)
EGFR: >90 mL/min/{1.73_m2} (ref >90)
Glucose: 101 mg/dl (ref 70–140)
POTASSIUM: 3.3 meq/L — AB (ref 3.5–5.1)
Sodium: 138 mEq/L (ref 136–145)
Total Bilirubin: 0.72 mg/dL (ref 0.20–1.20)
Total Protein: 7 g/dL (ref 6.4–8.3)

## 2016-01-06 LAB — CBC WITH DIFFERENTIAL/PLATELET
BASO%: 0.5 % (ref 0.0–2.0)
BASOS ABS: 0 10*3/uL (ref 0.0–0.1)
EOS%: 0.2 % (ref 0.0–7.0)
Eosinophils Absolute: 0 10*3/uL (ref 0.0–0.5)
HEMATOCRIT: 27.2 % — AB (ref 34.8–46.6)
HGB: 9.2 g/dL — ABNORMAL LOW (ref 11.6–15.9)
LYMPH#: 1.2 10*3/uL (ref 0.9–3.3)
LYMPH%: 23.1 % (ref 14.0–49.7)
MCH: 33.3 pg (ref 25.1–34.0)
MCHC: 33.7 g/dL (ref 31.5–36.0)
MCV: 98.5 fL (ref 79.5–101.0)
MONO#: 0.7 10*3/uL (ref 0.1–0.9)
MONO%: 13 % (ref 0.0–14.0)
NEUT#: 3.3 10*3/uL (ref 1.5–6.5)
NEUT%: 63.2 % (ref 38.4–76.8)
PLATELETS: 64 10*3/uL — AB (ref 145–400)
RBC: 2.76 10*6/uL — AB (ref 3.70–5.45)
RDW: 22.1 % — ABNORMAL HIGH (ref 11.2–14.5)
WBC: 5.2 10*3/uL (ref 3.9–10.3)

## 2016-01-06 MED ORDER — HEPARIN SOD (PORK) LOCK FLUSH 100 UNIT/ML IV SOLN
500.0000 [IU] | Freq: Once | INTRAVENOUS | Status: AC | PRN
  Administered 2016-01-06: 500 [IU] via INTRAVENOUS
  Filled 2016-01-06: qty 5

## 2016-01-06 MED ORDER — HYDROCODONE-HOMATROPINE 5-1.5 MG/5ML PO SYRP: 5.0000 mL | ORAL_SOLUTION | Freq: Four times a day (QID) | ORAL | 0 refills | Status: DC | PRN

## 2016-01-06 MED ORDER — HYDROMORPHONE HCL 4 MG PO TABS: 4.0000 mg | ORAL_TABLET | ORAL | 0 refills | Status: DC | PRN

## 2016-01-06 MED ORDER — SODIUM CHLORIDE 0.9% FLUSH
10.0000 mL | INTRAVENOUS | Status: DC | PRN
  Administered 2016-01-06: 10 mL via INTRAVENOUS
  Filled 2016-01-06: qty 10

## 2016-01-06 MED FILL — HYDROCODONE-HOMATROPINE SYR: 5-1.5 | 9 days supply | Qty: 180 | Fill #0

## 2016-01-06 NOTE — Assessment & Plan Note (Signed)
She is not symptomatic. She does not need any transfusion support today. Given her history of coronary artery disease, I will give her 1 unit of blood if her hemoglobin is less than 8 and 1 unit of platelets if she has signs of bleeding or platelet count less than 10,000

## 2016-01-06 NOTE — Progress Notes (Signed)
[facility] OFFICE PROGRESS NOTE  Patient Care Team: Provider Not In System as PCP - General  SUMMARY OF ONCOLOGIC HISTORY:   Ovarian cancer ([place])   07/13/2014 Initial Diagnosis    Cancer of ovary, IIIA      07/13/2014 Surgery    RSO, staging       Ovarian cancer, bilateral ([place])   06/19/2014 Imaging    Complex cystic mass is identified within the pelvis. This is likely arising from the right ovary. The cyst is complicated by internal areas of septation and large dystrophic calcifications. This likely represents an ovarian tumor. 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 No macroscopic fat however is identified. Surgical evaluation is recommended. 2. Nephrolithiasis. 3. Left kidney scarring.      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 (please see synoptic report and comment)  B:Pelvic adhesion, excision - Involved by serous carcinoma, largest focus measures 0.7 cm  C:Omentum, omentectomy - Involved by serous carcinoma, focus measures 0.3 cm   D:Peritoneum, pelvic nodule, excision  - No serous carcinoma identified - Fibroadipose tissue with chronic inflammation and focal calcification  Extensive lymph node dissection showed no evidence of lymph node involvement      07/15/2014 Surgery    She underwent SALPINGO-OOPHORECTOMY, COMPLETE OR PARTIAL, UNILATERAL OR BILATERAL (SEPARATE PROCEDURE) OMENTECTOMY, EPIPLOECTOMY, RESECTION OF OMENTUM LIMITED LYMPHADENECTOMY FOR STAGING (SEPARATE PROCEDURE); PELVIC AND PARA-AORTIC URETEROLYSIS, WITH OR WITHOUT REPOSITIONING OF URETER FOR RETROPERITONEAL FIBROSIS          08/10/2014 - 12/10/2014 Chemotherapy    She received 6 cycles of adjuvant treatment with carboplatin and Taxol       12/24/2014  Imaging    Outside CT scan of the abdomen and pelvis show no evidence of residual disease      07/19/2015 Pathology Results    Outside pathology: Left ovary - Invasive high grade serous carcinoma with associated psammoma bodies in a background of serous borderline tumor, 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) (see comment) - Largest invasive focus measures 0.6 cm (overall tumor is 3.9 cm) - Surface of the ovary is involved - Background follicular cysts, some hemorrhagic  Left fallopian tube - No carcinoma identified - Unremarkable fallopian tube with paratubal serous cysts - Fallopian tube adherent to left ovary      07/19/2015 Surgery    She underwent tobotic Laparoscopy, Surgical, With Total Hysterectomy, For Uterus 250 G Or Less; W/Removal Tube(S) And/Or Ovary(S)         08/12/2015 - 12/16/2015 Chemotherapy    She received adjuvant carbo/taxol through [facility] x 6 cycles       08/20/2015 Miscellaneous    She was evaluated by genetics testing and results are negative      11/28/2015 - 12/02/2015 Hospital Admission    She was admitted to the hospital with neutropenic sepsis and severe pancytopenia.      12/02/2015 Imaging    Focal area of altered perfusion in the interpolar right kidney. Pyelonephritis would be a consideration. 2. Nonobstructing left renal stones with associated scarring. 3. Stable appearance right renal cysts.      01/01/2016 Imaging    Ct abdomen and pelvis done at [facility] showed pulmonary groundglass opacities and scattered subcentimeter lung nodules. These are indeterminate. The differential diagnosis includes posttreatment changes, atypical infection and/or metastasis. Short-term follow-up  is recommended to ensure stability / resolution. No evidence of metastatic disease in the abdomen and pelvis.       INTERVAL HISTORY: Please see below for problem oriented charting. She returns today with the mother. She had recent CT  imaging done at [facility]. She continues to have mild cough especially at nighttime. No recent fever or chills. She has excellent energy level. She continues to have neuropathic pain The patient denies any recent signs or symptoms of bleeding such as spontaneous epistaxis, hematuria or hematochezia.   REVIEW OF SYSTEMS:   Constitutional: Denies fevers, chills or abnormal weight loss Eyes: Denies blurriness of vision Ears, nose, mouth, throat, and face: Denies mucositis or sore throat Cardiovascular: Denies palpitation, chest discomfort or lower extremity swelling Gastrointestinal:  Denies nausea, heartburn or change in bowel habits Skin: Denies abnormal skin rashes Lymphatics: Denies new lymphadenopathy or easy bruising Neurological:Denies numbness, tingling or new weaknesses Behavioral/Psych: Mood is stable, no new changes  All other systems were reviewed with the patient and are negative.  I have reviewed the past medical history, past surgical history, social history and family history with the patient and they are unchanged from previous note.  ALLERGIES:  is allergic to lisinopril.  MEDICATIONS:  Current Outpatient Prescriptions  Medication Sig Dispense Refill  . acetaminophen (TYLENOL) 500 MG tablet Take 1,000 mg by mouth every 6 (six) hours as needed for mild pain, moderate pain, fever or headache.    . albuterol (PROVENTIL HFA;VENTOLIN HFA) 108 (90 BASE) MCG/ACT inhaler Inhale 2 puffs into the lungs every 6 (six) hours as needed for wheezing or shortness of breath.    . ALPRAZolam (XANAX) 1 MG tablet Take 1 mg by mouth 3 (three) times daily as needed for anxiety.    Marland Kitchen amphetamine-dextroamphetamine (ADDERALL XR) 30 MG 24 hr capsule Take 30 mg by mouth daily with breakfast. Only takes when she is at school    . amphetamine-dextroamphetamine (ADDERALL) 20 MG tablet Take 20 mg by mouth daily with lunch. Only takes when she is at school    . aspirin 81 MG chewable tablet Chew  81 mg by mouth daily with breakfast.     . atorvastatin (LIPITOR) 40 MG tablet Take 40 mg by mouth at bedtime.     Marland Kitchen b complex vitamins tablet Take 1 tablet by mouth daily.    . cetirizine (ZYRTEC) 10 MG tablet Take 10 mg by mouth daily with breakfast.     . clonazePAM (KLONOPIN) 1 MG tablet TK 1 T PO BID PRA DO NOT TAKE WITH ALPRAZOLAM  0  . HYDROcodone-homatropine (HYCODAN) 5-1.5 MG/5ML syrup Take 5 mLs by mouth every 6 (six) hours as needed for cough. 180 mL 0  . HYDROmorphone (DILAUDID) 4 MG tablet Take 1 tablet (4 mg total) by mouth every 4 (four) hours as needed for severe pain. 60 tablet 0  . ibuprofen (ADVIL,MOTRIN) 200 MG tablet Take 400 mg by mouth every 6 (six) hours as needed for fever, headache, mild pain, moderate pain or cramping.    . lidocaine-prilocaine (EMLA) cream Apply 1 application topically as needed. 30 g 6  . losartan (COZAAR) 50 MG tablet Take 100 mg by mouth daily with breakfast.     . magic mouthwash w/lidocaine SOLN Take 10 mLs by mouth 4 (four) times daily as needed for mouth pain. 480 mL 0  . magnesium oxide (MAGNESIUM-OXIDE) 400 (241.3 Mg) MG tablet Take 400 mg by mouth 2 (two) times daily.    . metoprolol  succinate (TOPROL-XL) 100 MG 24 hr tablet Take 100 mg by mouth daily with breakfast. Take with or immediately following a meal.     . Multiple Vitamin (MULTIVITAMIN WITH MINERALS) TABS tablet Take 1 tablet by mouth daily with breakfast.     . nitroGLYCERIN (NITROSTAT) 0.4 MG SL tablet Place 0.4 mg under the tongue every 5 (five) minutes as needed for chest pain.    . Omega-3 Fatty Acids (FISH OIL PO) Take 1 capsule by mouth daily.    . phenazopyridine (PYRIDIUM) 200 MG tablet Take 1 tablet (200 mg total) by mouth 3 (three) times daily. 6 tablet 0  . potassium chloride SA (K-DUR,KLOR-CON) 20 MEQ tablet Take 20 mEq by mouth daily with breakfast.    . prasugrel (EFFIENT) 10 MG TABS tablet Take 10 mg by mouth daily with breakfast.     . pregabalin (LYRICA) 25 MG  capsule Take 1 capsule (25 mg total) by mouth 2 (two) times daily. 30 capsule 0  . venlafaxine XR (EFFEXOR-XR) 75 MG 24 hr capsule Take 75 mg by mouth.     No current facility-administered medications for this visit.    Facility-Administered Medications Ordered in Other Visits  Medication Dose Route Frequency Provider Last Rate Last Dose  . sodium chloride flush (NS) 0.9 % injection 10 mL  10 mL Intravenous PRN Nicholas Lose, MD   10 mL at 12/30/15 1022    PHYSICAL EXAMINATION: ECOG PERFORMANCE STATUS: 1 - Symptomatic but completely ambulatory  Vitals:   01/06/16 1041  BP: 135/73  Pulse: 98  Resp: 18  Temp: 98.3 F (36.8 C)   Filed Weights   01/06/16 1041  Weight: 200 lb 6.4 oz (90.9 kg)    GENERAL:alert, no distress and comfortable SKIN: skin color, texture, turgor are normal, no rashes or significant lesions EYES: normal, Conjunctiva are pink and non-injected, sclera clear OROPHARYNX:no exudate, no erythema and lips, buccal mucosa, and tongue normal  NECK: supple, thyroid normal size, non-tender, without nodularity LYMPH:  no palpable lymphadenopathy in the cervical, axillary or inguinal LUNGS: clear to auscultation and percussion with normal breathing effort HEART: regular rate & rhythm and no murmurs and no lower extremity edema ABDOMEN:abdomen soft, non-tender and normal bowel sounds Musculoskeletal:no cyanosis of digits and no clubbing  NEURO: alert & oriented x 3 with fluent speech, no focal motor/sensory deficits  LABORATORY DATA:  I have reviewed the data as listed    Component Value Date/Time   NA 138 01/06/2016 0954   K 3.3 (L) 01/06/2016 0954   CL 104 12/05/2015 0123   CO2 25 01/06/2016 0954   GLUCOSE 101 01/06/2016 0954   BUN 13.7 01/06/2016 0954   CREATININE 0.6 01/06/2016 0954   CALCIUM 8.6 01/06/2016 0954   PROT 7.0 01/06/2016 0954   ALBUMIN 3.6 01/06/2016 0954   AST 18 01/06/2016 0954   ALT 22 01/06/2016 0954   ALKPHOS 92 01/06/2016 0954    BILITOT 0.72 01/06/2016 0954   GFRNONAA >60 12/05/2015 0123   GFRAA >60 12/05/2015 0123    No results found for: SPEP, UPEP  Lab Results  Component Value Date   WBC 5.2 01/06/2016   NEUTROABS 3.3 01/06/2016   HGB 9.2 (L) 01/06/2016   HCT 27.2 (L) 01/06/2016   MCV 98.5 01/06/2016   PLT 64 (L) 01/06/2016      Chemistry      Component Value Date/Time   NA 138 01/06/2016 0954   K 3.3 (L) 01/06/2016 0954   CL 104 12/05/2015 0123  CO2 25 01/06/2016 0954   BUN 13.7 01/06/2016 0954   CREATININE 0.6 01/06/2016 0954      Component Value Date/Time   CALCIUM 8.6 01/06/2016 0954   ALKPHOS 92 01/06/2016 0954   AST 18 01/06/2016 0954   ALT 22 01/06/2016 0954   BILITOT 0.72 01/06/2016 0954       RADIOGRAPHIC STUDIES: I have personally reviewed the radiological images as listed and agreed with the findings in the report. Dg Chest 2 View  Result Date: 12/23/2015 CLINICAL DATA:  Cough. Neutropenia. Chest pain and shortness of breath for 5 days. History of ovarian cancer. EXAM: CHEST  2 VIEW COMPARISON:  11/30/2015 FINDINGS: Right jugular Port-A-Cath remains in place with tip overlying the SVC. Cardiomediastinal silhouette is within normal limits. No airspace consolidation, edema, pleural effusion, or pneumothorax is identified. No acute osseous abnormality is seen. IMPRESSION: No active cardiopulmonary disease. Electronically Signed   By: Logan Bores M.D.   On: 12/23/2015 09:59    ASSESSMENT & PLAN:  Ovarian cancer, bilateral [facility]) The patient completed 6 cycles of adjuvant treatment through [facility]. She is recovering from her side effects of treatment. I will get assistance from cancer survivorship clinic to arrange for appointment here.  I will see her back next month for further supportive care  Pancytopenia ([place])- due to chemoRx She is not symptomatic. She does not need any transfusion support today. Given her history of coronary artery disease, I will give her 1 unit  of blood if her hemoglobin is less than 8 and 1 unit of platelets if she has signs of bleeding or platelet count less than 10,000  Peripheral neuropathy due to chemotherapy [facility]) Her neuropathic pain is stable. I refill her prescription pain medicine and will reassess pain control next month  Mucositis due to antineoplastic therapy She has severe mucositis pain and bone pain. I refill her prescription pain medicine. Hopefully, it will improve when I see her next time  Cough in adult She continues to have intermittent cough. Her recent CT scan show resolving infection. I refill her prescription cough syrup. She does not need extended antibiotic treatment. I reassured the patient.   No orders of the defined types were placed in this encounter.  All questions were answered. The patient knows to call the clinic with any problems, questions or concerns. No barriers to learning was detected. I spent 25 minutes counseling the patient face to face. The total time spent in the appointment was 30 minutes and more than 50% was on counseling and review of test results     Heath Lark, MD 01/06/2016 1:08 PM

## 2016-01-06 NOTE — Assessment & Plan Note (Signed)
She has severe mucositis pain and bone pain. I refill her prescription pain medicine. Hopefully, it will improve when I see her next time

## 2016-01-06 NOTE — Patient Instructions (Signed)

## 2016-01-06 NOTE — Assessment & Plan Note (Signed)
The patient completed 6 cycles of adjuvant treatment through [facility]. She is recovering from her side effects of treatment. I will get assistance from cancer survivorship clinic to arrange for appointment here.  I will see her back next month for further supportive care

## 2016-01-06 NOTE — Telephone Encounter (Signed)
Appointments scheduled per 12/18 LOS. Patient given AVS report and calendars with future scheduled appointments.  °

## 2016-01-06 NOTE — Assessment & Plan Note (Signed)
She continues to have intermittent cough. Her recent CT scan show resolving infection. I refill her prescription cough syrup. She does not need extended antibiotic treatment. I reassured the patient.

## 2016-01-06 NOTE — Assessment & Plan Note (Signed)
Her neuropathic pain is stable. I refill her prescription pain medicine and will reassess pain control next month

## 2016-01-07 ENCOUNTER — Ambulatory Visit: Payer: BC Managed Care – PPO | Admitting: Hematology and Oncology

## 2016-01-08 MED FILL — HYDROmorphone HCL 4 MG TABS: 4 | 10 days supply | Qty: 60 | Fill #0

## 2016-01-09 ENCOUNTER — Other Ambulatory Visit: Payer: BC Managed Care – PPO

## 2016-01-16 ENCOUNTER — Other Ambulatory Visit: Payer: BC Managed Care – PPO

## 2016-01-22 ENCOUNTER — Other Ambulatory Visit: Payer: Self-pay | Admitting: *Deleted

## 2016-01-23 ENCOUNTER — Ambulatory Visit (HOSPITAL_BASED_OUTPATIENT_CLINIC_OR_DEPARTMENT_OTHER): Payer: BC Managed Care – PPO | Admitting: Hematology and Oncology

## 2016-01-23 ENCOUNTER — Encounter: Payer: Self-pay | Admitting: Hematology and Oncology

## 2016-01-23 ENCOUNTER — Other Ambulatory Visit (HOSPITAL_BASED_OUTPATIENT_CLINIC_OR_DEPARTMENT_OTHER): Payer: BC Managed Care – PPO

## 2016-01-23 ENCOUNTER — Telehealth: Payer: Self-pay | Admitting: Hematology and Oncology

## 2016-01-23 ENCOUNTER — Other Ambulatory Visit: Payer: BC Managed Care – PPO

## 2016-01-23 DIAGNOSIS — C561 Malignant neoplasm of right ovary: Secondary | ICD-10-CM

## 2016-01-23 DIAGNOSIS — C563 Malignant neoplasm of bilateral ovaries: Secondary | ICD-10-CM

## 2016-01-23 DIAGNOSIS — G620 Drug-induced polyneuropathy: Secondary | ICD-10-CM | POA: Diagnosis not present

## 2016-01-23 DIAGNOSIS — T451X5A Adverse effect of antineoplastic and immunosuppressive drugs, initial encounter: Principal | ICD-10-CM

## 2016-01-23 DIAGNOSIS — C562 Malignant neoplasm of left ovary: Secondary | ICD-10-CM | POA: Diagnosis not present

## 2016-01-23 DIAGNOSIS — D61818 Other pancytopenia: Secondary | ICD-10-CM

## 2016-01-23 LAB — CBC WITH DIFFERENTIAL/PLATELET
BASO%: 0.3 % (ref 0.0–2.0)
BASOS ABS: 0 10*3/uL (ref 0.0–0.1)
EOS%: 1.3 % (ref 0.0–7.0)
Eosinophils Absolute: 0.1 10*3/uL (ref 0.0–0.5)
HEMATOCRIT: 34.1 % — AB (ref 34.8–46.6)
HEMOGLOBIN: 11.6 g/dL (ref 11.6–15.9)
LYMPH#: 1 10*3/uL (ref 0.9–3.3)
LYMPH%: 13.4 % — ABNORMAL LOW (ref 14.0–49.7)
MCH: 35.5 pg — ABNORMAL HIGH (ref 25.1–34.0)
MCHC: 34 g/dL (ref 31.5–36.0)
MCV: 104.4 fL — ABNORMAL HIGH (ref 79.5–101.0)
MONO#: 0.5 10*3/uL (ref 0.1–0.9)
MONO%: 6.5 % (ref 0.0–14.0)
NEUT%: 78.5 % — ABNORMAL HIGH (ref 38.4–76.8)
NEUTROS ABS: 6.1 10*3/uL (ref 1.5–6.5)
Platelets: 165 10*3/uL (ref 145–400)
RBC: 3.27 10*6/uL — ABNORMAL LOW (ref 3.70–5.45)
RDW: 23 % — AB (ref 11.2–14.5)
WBC: 7.7 10*3/uL (ref 3.9–10.3)

## 2016-01-23 LAB — COMPREHENSIVE METABOLIC PANEL
ALK PHOS: 93 U/L (ref 40–150)
ALT: 18 U/L (ref 0–55)
AST: 16 U/L (ref 5–34)
Albumin: 4.3 g/dL (ref 3.5–5.0)
Anion Gap: 11 mEq/L (ref 3–11)
BILIRUBIN TOTAL: 0.49 mg/dL (ref 0.20–1.20)
BUN: 11.9 mg/dL (ref 7.0–26.0)
CALCIUM: 9 mg/dL (ref 8.4–10.4)
CO2: 20 mEq/L — ABNORMAL LOW (ref 22–29)
CREATININE: 0.6 mg/dL (ref 0.6–1.1)
Chloride: 107 mEq/L (ref 98–109)
EGFR: >90 mL/min/{1.73_m2} (ref >90)
GLUCOSE: 93 mg/dL (ref 70–140)
POTASSIUM: 3.6 meq/L (ref 3.5–5.1)
SODIUM: 139 meq/L (ref 136–145)
TOTAL PROTEIN: 7.5 g/dL (ref 6.4–8.3)

## 2016-01-23 MED ORDER — PREGABALIN 50 MG PO CAPS: 50.0000 mg | ORAL_CAPSULE | Freq: Two times a day (BID) | ORAL | 9 refills | Status: DC

## 2016-01-23 MED ORDER — HYDROMORPHONE HCL 4 MG PO TABS: 4.0000 mg | ORAL_TABLET | Freq: Four times a day (QID) | ORAL | 0 refills | Status: DC | PRN

## 2016-01-23 MED FILL — LYRICA 50 MG CAPSULE: 50 | 30 days supply | Qty: 60 | Fill #0

## 2016-01-23 MED FILL — HYDROmorphone HCL 4 MG TABS: 4 | 23 days supply | Qty: 90 | Fill #0

## 2016-01-23 NOTE — Telephone Encounter (Signed)
Appointments scheduled per 01/23/16 los. Patient was given a copy of the appointment schedule and AVS report, per 01/23/16 los.

## 2016-01-24 ENCOUNTER — Encounter: Payer: Self-pay | Admitting: Hematology and Oncology

## 2016-01-24 NOTE — Progress Notes (Signed)
[facility] OFFICE PROGRESS NOTE  Patient Care Team: Provider Not In System as PCP - General  SUMMARY OF ONCOLOGIC HISTORY:   Ovarian cancer ([place])   07/13/2014 Initial Diagnosis    Cancer of ovary, IIIA      07/13/2014 Surgery    RSO, staging       Ovarian cancer, bilateral ([place])   06/19/2014 Imaging    Complex cystic mass is identified within the pelvis. This is likely arising from the right ovary. The cyst is complicated by internal areas of septation and large dystrophic calcifications. This likely represents an ovarian tumor. 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 No macroscopic fat however is identified. Surgical evaluation is recommended. 2. Nephrolithiasis. 3. Left kidney scarring.      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 (please see synoptic report and comment)  B:Pelvic adhesion, excision - Involved by serous carcinoma, largest focus measures 0.7 cm  C:Omentum, omentectomy - Involved by serous carcinoma, focus measures 0.3 cm   D:Peritoneum, pelvic nodule, excision  - No serous carcinoma identified - Fibroadipose tissue with chronic inflammation and focal calcification  Extensive lymph node dissection showed no evidence of lymph node involvement      07/15/2014 Surgery    She underwent SALPINGO-OOPHORECTOMY, COMPLETE OR PARTIAL, UNILATERAL OR BILATERAL (SEPARATE PROCEDURE) OMENTECTOMY, EPIPLOECTOMY, RESECTION OF OMENTUM LIMITED LYMPHADENECTOMY FOR STAGING (SEPARATE PROCEDURE); PELVIC AND PARA-AORTIC URETEROLYSIS, WITH OR WITHOUT REPOSITIONING OF URETER FOR RETROPERITONEAL FIBROSIS          08/10/2014 - 12/10/2014 Chemotherapy    She received 6 cycles of adjuvant treatment with carboplatin and Taxol       12/24/2014  Imaging    Outside CT scan of the abdomen and pelvis show no evidence of residual disease      07/19/2015 Pathology Results    Outside pathology: Left ovary - Invasive high grade serous carcinoma with associated psammoma bodies in a background of serous borderline tumor, 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) (see comment) - Largest invasive focus measures 0.6 cm (overall tumor is 3.9 cm) - Surface of the ovary is involved - Background follicular cysts, some hemorrhagic  Left fallopian tube - No carcinoma identified - Unremarkable fallopian tube with paratubal serous cysts - Fallopian tube adherent to left ovary      07/19/2015 Surgery    She underwent tobotic Laparoscopy, Surgical, With Total Hysterectomy, For Uterus 250 G Or Less; W/Removal Tube(S) And/Or Ovary(S)         08/12/2015 - 12/16/2015 Chemotherapy    She received adjuvant carbo[facility]taxol through [facility] x 6 cycles       08/20/2015 Miscellaneous    She was evaluated by genetics testing and results are negative      11/28/2015 - 12/02/2015 Hospital Admission    She was admitted to the hospital with neutropenic sepsis and severe pancytopenia.      12/02/2015 Imaging    Focal area of altered perfusion in the interpolar right kidney. Pyelonephritis would be a consideration. 2. Nonobstructing left renal stones with associated scarring. 3. Stable appearance right renal cysts.      01/01/2016 Imaging    Ct abdomen and pelvis done at [facility] showed pulmonary groundglass opacities and scattered subcentimeter lung nodules. These are indeterminate. The differential diagnosis includes posttreatment changes, atypical infection and/or metastasis. Short-term follow-up  is recommended to ensure stability / resolution. No evidence of metastatic disease in the abdomen and pelvis.       INTERVAL HISTORY: Please see below for problem oriented charting. She returns for supportive care She felt  better Neuropathy is stable; her pain control is worse because she is attempting to wean herself off narcotics Denies recent nausea or constipation  REVIEW OF SYSTEMS:   Constitutional: Denies fevers, chills or abnormal weight loss Eyes: Denies blurriness of vision Ears, nose, mouth, throat, and face: Denies mucositis or sore throat Respiratory: Denies cough, dyspnea or wheezes Cardiovascular: Denies palpitation, chest discomfort or lower extremity swelling Gastrointestinal:  Denies nausea, heartburn or change in bowel habits Skin: Denies abnormal skin rashes Lymphatics: Denies new lymphadenopathy or easy bruising Neurological:Denies numbness, tingling or new weaknesses Behavioral/Psych: Mood is stable, no new changes  All other systems were reviewed with the patient and are negative.  I have reviewed the past medical history, past surgical history, social history and family history with the patient and they are unchanged from previous note.  ALLERGIES:  is allergic to lisinopril.  MEDICATIONS:  Current Outpatient Prescriptions  Medication Sig Dispense Refill  . acetaminophen (TYLENOL) 500 MG tablet Take 1,000 mg by mouth every 6 (six) hours as needed for mild pain, moderate pain, fever or headache.    . amphetamine-dextroamphetamine (ADDERALL XR) 30 MG 24 hr capsule Take 30 mg by mouth daily with breakfast. Only takes when she is at school    . amphetamine-dextroamphetamine (ADDERALL) 20 MG tablet Take 20 mg by mouth daily with lunch. Only takes when she is at school    . aspirin 81 MG chewable tablet Chew 81 mg by mouth daily with breakfast.     . atorvastatin (LIPITOR) 40 MG tablet Take 40 mg by mouth at bedtime.     Marland Kitchen b complex vitamins tablet Take 1 tablet by mouth daily.    . cetirizine (ZYRTEC) 10 MG tablet Take 10 mg by mouth daily with breakfast.     . HYDROmorphone (DILAUDID) 4 MG tablet Take 1 tablet (4 mg total) by mouth every 6 (six) hours as needed for severe pain. 90  tablet 0  . ibuprofen (ADVIL,MOTRIN) 200 MG tablet Take 400 mg by mouth every 6 (six) hours as needed for fever, headache, mild pain, moderate pain or cramping.    Marland Kitchen losartan (COZAAR) 50 MG tablet Take 100 mg by mouth daily with breakfast.     . magnesium oxide (MAGNESIUM-OXIDE) 400 (241.3 Mg) MG tablet Take 400 mg by mouth 2 (two) times daily.    . metoprolol succinate (TOPROL-XL) 100 MG 24 hr tablet Take 100 mg by mouth daily with breakfast. Take with or immediately following a meal.     . Multiple Vitamin (MULTIVITAMIN WITH MINERALS) TABS tablet Take 1 tablet by mouth daily with breakfast.     . Omega-3 Fatty Acids (FISH OIL PO) Take 1 capsule by mouth daily.    . prasugrel (EFFIENT) 10 MG TABS tablet Take 10 mg by mouth daily with breakfast.     . venlafaxine XR (EFFEXOR-XR) 75 MG 24 hr capsule Take 75 mg by mouth.    Marland Kitchen albuterol (PROVENTIL HFA;VENTOLIN HFA) 108 (90 BASE) MCG/ACT inhaler Inhale 2 puffs into the lungs every 6 (six) hours as needed for wheezing or shortness of breath.    . ALPRAZolam (XANAX) 1 MG tablet Take 1 mg by mouth 3 (three) times daily as needed for anxiety.    Marland Kitchen HYDROcodone-homatropine (HYCODAN) 5-1.5 MG/5ML  syrup Take 5 mLs by mouth every 6 (six) hours as needed for cough. (Patient not taking: Reported on 01/23/2016) 180 mL 0  . lidocaine-prilocaine (EMLA) cream Apply 1 application topically as needed. (Patient not taking: Reported on 01/23/2016) 30 g 6  . magic mouthwash w/lidocaine SOLN Take 10 mLs by mouth 4 (four) times daily as needed for mouth pain. (Patient not taking: Reported on 01/23/2016) 480 mL 0  . nitroGLYCERIN (NITROSTAT) 0.4 MG SL tablet Place 0.4 mg under the tongue every 5 (five) minutes as needed for chest pain.    . phenazopyridine (PYRIDIUM) 200 MG tablet Take 1 tablet (200 mg total) by mouth 3 (three) times daily. (Patient not taking: Reported on 01/23/2016) 6 tablet 0  . pregabalin (LYRICA) 50 MG capsule Take 1 capsule (50 mg total) by mouth 2 (two) times  daily. 60 capsule 9   No current facility-administered medications for this visit.    Facility-Administered Medications Ordered in Other Visits  Medication Dose Route Frequency Provider Last Rate Last Dose  . sodium chloride flush (NS) 0.9 % injection 10 mL  10 mL Intravenous PRN Nicholas Lose, MD   10 mL at 12/30/15 1022    PHYSICAL EXAMINATION: ECOG PERFORMANCE STATUS: 1 - Symptomatic but completely ambulatory  Vitals:   01/23/16 1431  BP: 134/79  Pulse: 93  Resp: 16  Temp: 98.1 F (36.7 C)   Filed Weights   01/23/16 1431  Weight: 196 lb 12.8 oz (89.3 kg)    GENERAL:alert, no distress and comfortable SKIN: skin color, texture, turgor are normal, no rashes or significant lesions EYES: normal, Conjunctiva are pink and non-injected, sclera clear OROPHARYNX:no exudate, no erythema and lips, buccal mucosa, and tongue normal  NECK: supple, thyroid normal size, non-tender, without nodularity LYMPH:  no palpable lymphadenopathy in the cervical, axillary or inguinal LUNGS: clear to auscultation and percussion with normal breathing effort HEART: regular rate & rhythm and no murmurs and no lower extremity edema ABDOMEN:abdomen soft, non-tender and normal bowel sounds Musculoskeletal:no cyanosis of digits and no clubbing  NEURO: alert & oriented x 3 with fluent speech, no focal motor/sensory deficits  LABORATORY DATA:  I have reviewed the data as listed    Component Value Date/Time   NA 139 01/23/2016 1405   K 3.6 01/23/2016 1405   CL 104 12/05/2015 0123   CO2 20 (L) 01/23/2016 1405   GLUCOSE 93 01/23/2016 1405   BUN 11.9 01/23/2016 1405   CREATININE 0.6 01/23/2016 1405   CALCIUM 9.0 01/23/2016 1405   PROT 7.5 01/23/2016 1405   ALBUMIN 4.3 01/23/2016 1405   AST 16 01/23/2016 1405   ALT 18 01/23/2016 1405   ALKPHOS 93 01/23/2016 1405   BILITOT 0.49 01/23/2016 1405   GFRNONAA >60 12/05/2015 0123   GFRAA >60 12/05/2015 0123    No results found for: SPEP, UPEP  Lab  Results  Component Value Date   WBC 7.7 01/23/2016   NEUTROABS 6.1 01/23/2016   HGB 11.6 01/23/2016   HCT 34.1 (L) 01/23/2016   MCV 104.4 (H) 01/23/2016   PLT 165 01/23/2016      Chemistry      Component Value Date/Time   NA 139 01/23/2016 1405   K 3.6 01/23/2016 1405   CL 104 12/05/2015 0123   CO2 20 (L) 01/23/2016 1405   BUN 11.9 01/23/2016 1405   CREATININE 0.6 01/23/2016 1405      Component Value Date/Time   CALCIUM 9.0 01/23/2016 1405   ALKPHOS 93 01/23/2016 1405  AST 16 01/23/2016 1405   ALT 18 01/23/2016 1405   BILITOT 0.49 01/23/2016 1405       ASSESSMENT & PLAN:  Ovarian cancer, bilateral ([place]) The patient completed 6 cycles of adjuvant treatment through [facility]. She is recovering from her side effects of treatment. I will defer to her GYN surgeon related to CT imaging I will get assistance from cancer survivorship clinic to arrange for appointment here We discussed increase physical activity and possible enrollment with the [employer] program with the [facility]. I will see her back at the end of the month for further supportive care  Peripheral neuropathy due to chemotherapy [facility]) Her neuropathic pain is stable. I refill her prescription pain medicine and will reassess pain control  I am initiating narcotic taper and will increase dose of Lyrica   No orders of the defined types were placed in this encounter.  All questions were answered. The patient knows to call the clinic with any problems, questions or concerns. No barriers to learning was detected. I spent 15 minutes counseling the patient face to face. The total time spent in the appointment was 20 minutes and more than 50% was on counseling and review of test results     Heath Lark, MD 01/24/2016 9:53 PM

## 2016-01-24 NOTE — Assessment & Plan Note (Addendum)
The patient completed 6 cycles of adjuvant treatment through [facility]. She is recovering from her side effects of treatment. I will defer to her GYN surgeon related to CT imaging I will get assistance from cancer survivorship clinic to arrange for appointment here We discussed increase physical activity and possible enrollment with the [employer] program with the [facility]. I will see her back at the end of the month for further supportive care

## 2016-01-24 NOTE — Assessment & Plan Note (Signed)
Her neuropathic pain is stable. I refill her prescription pain medicine and will reassess pain control  I am initiating narcotic taper and will increase dose of Lyrica

## 2016-01-27 ENCOUNTER — Encounter: Payer: Self-pay | Admitting: Adult Health

## 2016-01-27 ENCOUNTER — Ambulatory Visit (HOSPITAL_BASED_OUTPATIENT_CLINIC_OR_DEPARTMENT_OTHER): Payer: BC Managed Care – PPO | Admitting: Adult Health

## 2016-01-27 VITALS — BP 130/78 | HR 87 | Temp 98.5°F | Resp 18 | Ht 63.0 in | Wt 202.3 lb

## 2016-01-27 DIAGNOSIS — C561 Malignant neoplasm of right ovary: Secondary | ICD-10-CM

## 2016-01-27 DIAGNOSIS — C563 Malignant neoplasm of bilateral ovaries: Secondary | ICD-10-CM

## 2016-01-27 DIAGNOSIS — C562 Malignant neoplasm of left ovary: Secondary | ICD-10-CM

## 2016-01-27 NOTE — Progress Notes (Signed)
CLINIC:  Survivorship   REASON FOR VISIT:  Routine follow-up for recent history of bilateral ovarian cancer.   BRIEF ONCOLOGIC HISTORY:    Ovarian cancer ([place])   07/13/2014 Initial Diagnosis    Cancer of ovary, IIIA      07/13/2014 Surgery    RSO, staging       Ovarian cancer, bilateral ([place])   06/19/2014 Imaging    Complex cystic mass is identified within the pelvis. This is likely arising from the right ovary. The cyst is complicated by internal areas of septation and large dystrophic calcifications. This likely represents an ovarian tumor. 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 No macroscopic fat however is identified. Surgical evaluation is recommended. 2. Nephrolithiasis. 3. Left kidney scarring.      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 (please see synoptic report and comment)  B:Pelvic adhesion, excision - Involved by serous carcinoma, largest focus measures 0.7 cm  C:Omentum, omentectomy - Involved by serous carcinoma, focus measures 0.3 cm   D:Peritoneum, pelvic nodule, excision  - No serous carcinoma identified - Fibroadipose tissue with chronic inflammation and focal calcification  Extensive lymph node dissection showed no evidence of lymph node involvement      07/15/2014 Surgery    She underwent SALPINGO-OOPHORECTOMY, COMPLETE OR PARTIAL, UNILATERAL OR BILATERAL (SEPARATE PROCEDURE) OMENTECTOMY, EPIPLOECTOMY, RESECTION OF OMENTUM LIMITED LYMPHADENECTOMY FOR STAGING (SEPARATE PROCEDURE); PELVIC AND PARA-AORTIC URETEROLYSIS, WITH OR WITHOUT REPOSITIONING OF URETER FOR RETROPERITONEAL FIBROSIS          08/10/2014 - 12/10/2014 Chemotherapy    She received 6 cycles of adjuvant treatment with carboplatin and Taxol       12/24/2014  Imaging    Outside CT scan of the abdomen and pelvis show no evidence of residual disease      07/19/2015 Pathology Results    Outside pathology: Left ovary - Invasive high grade serous carcinoma with associated psammoma bodies in a background of serous borderline tumor, 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) (see comment) - Largest invasive focus measures 0.6 cm (overall tumor is 3.9 cm) - Surface of the ovary is involved - Background follicular cysts, some hemorrhagic  Left fallopian tube - No carcinoma identified - Unremarkable fallopian tube with paratubal serous cysts - Fallopian tube adherent to left ovary      07/19/2015 Surgery    She underwent tobotic Laparoscopy, Surgical, With Total Hysterectomy, For Uterus 250 G Or Less; W/Removal Tube(S) And/Or Ovary(S)         08/12/2015 - 12/16/2015 Chemotherapy    She received adjuvant carbo/taxol through [facility] x 6 cycles       08/20/2015 Miscellaneous    She was evaluated by genetics testing and results are negative      11/28/2015 - 12/02/2015 Hospital Admission    She was admitted to the hospital with neutropenic sepsis and severe pancytopenia.      12/02/2015 Imaging    Focal area of altered perfusion in the interpolar right kidney. Pyelonephritis would be a consideration. 2. Nonobstructing left renal stones with associated scarring. 3. Stable appearance right renal cysts.      01/01/2016 Imaging    Ct abdomen and pelvis done at [facility] showed pulmonary groundglass opacities and scattered subcentimeter lung nodules. These are indeterminate. The differential diagnosis includes posttreatment changes, atypical infection and/or metastasis. Short-term  follow-up is recommended to ensure stability / resolution. No evidence of metastatic disease in the abdomen and pelvis.       INTERVAL HISTORY:  Ms. Attanasio presents to the Survivorship Clinic today at the request of Dr. Heath Lark for patient's  history of bilateral ovarian cancer.  Overall, she reports feeling relatively well. She has had some recent nausea and decreased appetite; this is improving.  Her weight has been fluctuating about 4-5 lbs, but is overall stable.    Her biggest concerns today are anxiety and depression.  She shared with me that recently one of her friends, "who had the same diagnosis as me and was even younger than me recently died."  Ms. Wolaver tells me she began having "night terrors" after learning of her friend's death; "I realized that I am scared to die from this cancer."  She shared that she spent much of this week grieving at her parents' house where she has received additional support.  She tells me that most of her friends and family have been supportive during her cancer treatments, but she has been struggling with "all the negative ones who just want to tell me about somebody they knew who died of cancer."  She even shared with me that one of her friends "asked me for some of my baking dishes in case I'm not here to make sure she gets them...insinuating that I am going to die."  She is worried about being able to find a significant other in the future "because I've had cancer and because I can't ever have any kids now."  She has not been able to take advantage of any support groups here at the cancer center since she has only recently recovered from her most recent cycles of chemotherapy.  She is on anti-depressant therapy with Effexor daily, with Xanax as needed.   She plans to return to her job tomorrow, which is causing her some additional stress as well.  She teaches art to elementary school students.  She is nervous about talking with her co-workers and students about her cancer and treatments.    REVIEW OF SYSTEMS: (per ROS paperwork pt completed; of note: largest majority of today's visit spent on anxiety/depression).  Review of Systems  Constitutional: Positive for fatigue.  HENT:         Sinus problems  and runny nose  Eyes: Negative.   Respiratory: Positive for cough (Dry cough).        Dyspnea on exertion  Cardiovascular: Negative.        History of heart attack in 2015  Gastrointestinal: Positive for nausea.       Heartburn and poor appetite; reports that she has abdominal hernia "but I don't feel it."   Endocrine: Positive for hot flashes.  Genitourinary: Negative.  Negative for vaginal bleeding.   Musculoskeletal: Positive for arthralgias and neck pain.  Neurological: Positive for extremity weakness and numbness.  Hematological: Bruises/bleeds easily (On blood thinners secondary to heart attack).  Psychiatric/Behavioral: Positive for depression and sleep disturbance. The patient is nervous/anxious.      A 14-point review of systems was completed and was negative, except as noted above.    PAST MEDICAL/SURGICAL HISTORY:  Past Medical History:  Diagnosis Date  . Heart attack   . Ovarian cancer ([place]) 2016   right ovary   Past Surgical History:  Procedure Laterality Date  . ABDOMINAL HYSTERECTOMY    . BREAST REDUCTION SURGERY    . CORONARY ANGIOPLASTY WITH STENT PLACEMENT    .  MOUTH SURGERY       ALLERGIES:  Allergies  Allergen Reactions  . Lisinopril Cough     CURRENT MEDICATIONS:  Outpatient Encounter Prescriptions as of 01/27/2016  Medication Sig Note  . acetaminophen (TYLENOL) 500 MG tablet Take 1,000 mg by mouth every 6 (six) hours as needed for mild pain, moderate pain, fever or headache.   . albuterol (PROVENTIL HFA;VENTOLIN HFA) 108 (90 BASE) MCG/ACT inhaler Inhale 2 puffs into the lungs every 6 (six) hours as needed for wheezing or shortness of breath. 01/23/2016: Needs refill  . ALPRAZolam (XANAX) 1 MG tablet Take 1 mg by mouth 3 (three) times daily as needed for anxiety.   Marland Kitchen amphetamine-dextroamphetamine (ADDERALL XR) 30 MG 24 hr capsule Take 30 mg by mouth daily with breakfast. Only takes when she is at school   . amphetamine-dextroamphetamine (ADDERALL)  20 MG tablet Take 20 mg by mouth daily with lunch. Only takes when she is at school   . aspirin 81 MG chewable tablet Chew 81 mg by mouth daily with breakfast.    . atorvastatin (LIPITOR) 40 MG tablet Take 40 mg by mouth at bedtime.    Marland Kitchen b complex vitamins tablet Take 1 tablet by mouth daily.   . cetirizine (ZYRTEC) 10 MG tablet Take 10 mg by mouth daily with breakfast.    . HYDROcodone-homatropine (HYCODAN) 5-1.5 MG/5ML syrup Take 5 mLs by mouth every 6 (six) hours as needed for cough. (Patient not taking: Reported on 01/23/2016)   . HYDROmorphone (DILAUDID) 4 MG tablet Take 1 tablet (4 mg total) by mouth every 6 (six) hours as needed for severe pain.   Marland Kitchen ibuprofen (ADVIL,MOTRIN) 200 MG tablet Take 400 mg by mouth every 6 (six) hours as needed for fever, headache, mild pain, moderate pain or cramping.   . lidocaine-prilocaine (EMLA) cream Apply 1 application topically as needed. (Patient not taking: Reported on 01/23/2016)   . losartan (COZAAR) 50 MG tablet Take 100 mg by mouth daily with breakfast.    . magic mouthwash w/lidocaine SOLN Take 10 mLs by mouth 4 (four) times daily as needed for mouth pain. (Patient not taking: Reported on 01/23/2016)   . magnesium oxide (MAGNESIUM-OXIDE) 400 (241.3 Mg) MG tablet Take 400 mg by mouth 2 (two) times daily.   . metoprolol succinate (TOPROL-XL) 100 MG 24 hr tablet Take 100 mg by mouth daily with breakfast. Take with or immediately following a meal.    . Multiple Vitamin (MULTIVITAMIN WITH MINERALS) TABS tablet Take 1 tablet by mouth daily with breakfast.    . nitroGLYCERIN (NITROSTAT) 0.4 MG SL tablet Place 0.4 mg under the tongue every 5 (five) minutes as needed for chest pain.   . Omega-3 Fatty Acids (FISH OIL PO) Take 1 capsule by mouth daily.   . phenazopyridine (PYRIDIUM) 200 MG tablet Take 1 tablet (200 mg total) by mouth 3 (three) times daily. (Patient not taking: Reported on 01/23/2016)   . prasugrel (EFFIENT) 10 MG TABS tablet Take 10 mg by mouth daily  with breakfast.    . pregabalin (LYRICA) 50 MG capsule Take 1 capsule (50 mg total) by mouth 2 (two) times daily.   Marland Kitchen venlafaxine XR (EFFEXOR-XR) 75 MG 24 hr capsule Take 75 mg by mouth. 12/23/2015: Received from: [facility] [facility]: Take 1 capsule (75 mg total) by mouth daily.   Facility-Administered Encounter Medications as of 01/27/2016  Medication  . sodium chloride flush (NS) 0.9 % injection 10 mL     ONCOLOGIC FAMILY HISTORY:  Family History  Problem Relation Age of Onset  . Colon polyps Father     about 2-3 removed following colonoscopy  . Irritable bowel syndrome Brother     1 previous colonoscopy - no polyps found  . Breast cancer Paternal Aunt 68    "low level" breast cancer  . Prostate cancer Maternal Grandfather 103  . Lymphoma Maternal Grandfather 71    large B-cell lymphoma  . Heart Problems Paternal Grandmother   . Heart attack Paternal Grandfather   . Cancer Other 64    unknown type      SOCIAL HISTORY:  CELECIA HOHLT is single and lives alone in [place], [place].  She does not have any children.  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  She denies any current tobacco, alcohol, or illicit drug use.      PHYSICAL EXAMINATION:  Vital Signs: Vitals:   01/27/16 1200  BP: 130/78  Pulse: 87  Resp: 18  Temp: 98.5 F (36.9 C)   Filed Weights   01/27/16 1200  Weight: 202 lb 4.8 oz (91.8 kg)   General: Female in no acute distress. Unaccompanied today. HEENT: Head is normocephalic.  Pupils equal and reactive to light. Conjunctivae clear without exudate.  Sclerae anicteric. Oral mucosa is pink, moist.  Oropharynx is pink without lesions or erythema.  Lymph: No cervical, supraclavicular, infraclavicular, or axillary lymphadenopathy noted on palpation.  Cardiovascular: Regular rate and rhythm.Marland Kitchen Respiratory: Clear to auscultation bilaterally. Chest expansion symmetric; breathing non-labored.  GI: Abdomen soft and round; non-tender,  non-distended. Bowel sounds normoactive. No hepatosplenomegaly.   GU: Deferred.  Neuro: No focal deficits. Steady gait.  Psych: Mood and affect normal and appropriate for situation. Appropriately tearful at times.  Extremities: No edema. Skin: Warm and dry.   LABORATORY DATA:  None for this visit.  DIAGNOSTIC IMAGING:  None for this visit.     ASSESSMENT AND PLAN:  Ms.. Bisson is a pleasant 36 y.o. 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 She initially underwent right oophorectomy with omentectomy and pelvic lymph node resection at [facility] on 07/13/14.  She went on to complete 6 cycles of adjuvant Carbo/Taxol at [facility] from 08/10/14 to 12/10/14.  Post-operatively she continued to have abnormal uterine bleeding and was followed clinically; it was then decided to proceed with hysterectomy.  On 07/19/15, she underwent complete hysterectomy and left salpingo-oopherectomy with revealed invasive high-grade serous carcinoma.  Given disease noted to left ovary, she went on to complete 6 additional cycles of adjuvant chemo with Carbo/Taxol at [facility]; completed chemo on 12/16/15. Ms.Smoots presents to the Survivorship Clinic as referred by Dr. Alvy Bimler for survivorship visit and routine follow-up.    1. Stage IIIA bilateral ovarian cancer:  Ms. Safer is currently clinically and radiographically without evidence of disease or recurrence of bilateral ovarian cancer. She will follow-up with Dr. Alvy Bimler on 02/18/16 for continued follow-up. She will see Dr. Alycia Rossetti on 03/18/16.  I will defer any future surveillance imaging to her treatment team, as indicated.  Today, Ms. Fosnaugh was provided a copy of her Survivorship Care Plan document which highlights her treatment she received for the cancer, as well as recommendations for the future.  She understands that routine imaging is generally not recommended, unless we are monitoring a finding from previous scans  or if she has new symptoms that warrant additional work-up.  I explained the rationale for this.  Her last CT abdomen/pelvis completed at  [facility] on 01/01/16 should pulmonary ground-glass opacities and scattered sub-centimeter lung nodules; otherwise no evidence of metastatic disease in abdomen or pelvis.  She will get CA-125 monitoring, as determined appropriate by her treatment team.  I encouraged her to call me with any questions or concerns before her next visit at the cancer center, and I would be happy to see the patient sooner, if needed.  Ms. Otoole did mention that she would like to have her port-a-cath removed; I will let Dr. Alvy Bimler know, but defer to treatment team on their final decision if this is appropriate at this time or not.    2. Adjustment disorder with anxious & depressed mood: It is not uncommon for this period of the patient's cancer care trajectory to be one of many emotions and stressors.  Much of what Ms. Lingle is experiencing is very normal given the traumatic experiences she has endured with her cancer diagnoses and subsequent treatments.  Given her reported night terrors, there could be an element of post-traumatic stress disorder (PTSD) as well, which is also relatively common in cancer survivors.  I really think she would benefit from some additional time being able to grieve the changes to her life and body, as well as gather more tools for effective coping as a young cancer survivor.  She is very enthusiastic about pursuing professional counseling.  I think our counseling interns here at the cancer center would be a great place for her to get started, as they have specialized training with cancer patients and is available free of charge.  She understands that should she need long-term counseling support, she may need to be referred to a therapist in the community that can help her over a longer period of time, but starting with our free counseling services would be great.  Additional  focus spent on grieving the changes in her body and her loss of fertility at such a young age would be appreciated.  Also, learning specific strategies to handle negative influences from friends/family/coworkers would be helpful for her as well.  We also discussed an opportunity for her to participate in the next session of [facility] ("Finding Your New Normal") support group series designed for patients after they have completed treatment.   Ms. Toothaker was encouraged to take advantage of our many other support services programs, support groups, and/or counseling in coping with her new life as a cancer survivor after completing anti-cancer treatment.  The Gyn-Onc support group was highlighted for her on our cancer support programs calendar and she was provided a copy.  Ms. Macik was offered support today through active listening and expressive supportive counseling.  She was given information regarding our available services and encouraged to contact me with any questions or for help enrolling in any of our support group/programs.   3. Health maintenance and wellness promotion: Ms. Maloney was encouraged to consume 5-7 servings of fruits and vegetables per day. She was given a copy of the "Nutrition Rainbow" handout, which provides patients education about healthy food options and the anti-cancer properties available in foods of certain color groups.  The patient was also encouraged to engage in moderate to vigorous exercise for 30 minutes per day most days of the week. We discussed the LiveStrong program through the [facility], which is a fitness program designed for cancer survivors and is available free of charge.  Ms. Striffler was instructed to limit her alcohol consumption and continue to abstain from tobacco use.  She was given a copy  of the "Reduce Cancer Risk" booklet from the [facility] which highlights healthy behaviors/wellness and the importance of health maintenance in reducing cancer risk.     Dispo:    -Return to cancer center to see Dr. Alvy Bimler on 02/18/16 -Return to see Dr. Alycia Rossetti on 03/18/16 -She is welcome to return to the Survivorship Clinic at any time in the future, as needed.    A total of 65 minutes of face-to-face time was spent with this patient with greater than 50% of that time in counseling and care-coordination.   Mike Craze, NP Survivorship Program [place] [phone]   Note: PRIMARY CARE PROVIDER PROVIDER NOT IN SYSTEM None None

## 2016-01-28 NOTE — Progress Notes (Signed)
Counseling intern received patient's name as a referral from [employer]. Counselor called at approximately 12:45 pm and  left patient a voicemail message with introduction and invitation to set up initial appointment.  Lamount Cohen, Counseling Intern Department for Spiritual Care and [facility] Supervisor - Scientist, research (physical sciences)

## 2016-01-30 ENCOUNTER — Other Ambulatory Visit: Payer: Self-pay | Admitting: Gynecologic Oncology

## 2016-01-30 ENCOUNTER — Telehealth: Payer: Self-pay | Admitting: Gynecologic Oncology

## 2016-01-30 ENCOUNTER — Other Ambulatory Visit: Payer: BC Managed Care – PPO

## 2016-01-30 DIAGNOSIS — Z87440 Personal history of urinary (tract) infections: Secondary | ICD-10-CM

## 2016-01-30 DIAGNOSIS — R918 Other nonspecific abnormal finding of lung field: Secondary | ICD-10-CM

## 2016-01-30 DIAGNOSIS — C561 Malignant neoplasm of right ovary: Secondary | ICD-10-CM

## 2016-01-30 NOTE — Telephone Encounter (Signed)
Called to speak with the patient about upcoming CT scan.  She is set up for a CT chest at [facility] on 2/2 but would like to cancel and reschedule in [place].  She also states she would like to go ahead and proceed with CT of the AP because she cannot wait for six months (nervous on results).  Per Dr. Alycia Rossetti, she is fine with the patient having the CT AP if she desired since her CA 125 was not a marker for her.  Wanting to have scan the first week of Feb even though her appt with Dr. Alycia Rossetti is on Feb 28 and ok to have the results called to her.  She also states she wants to have her port removed but advised her it would be best to wait until the CT results were available.  She is agreeable with this plan.  CT CAP ordered and scheduled for Feb 2. Message sent to [facility] in prior [facility] department.  Message sent to [facility], RN at [facility] and Dr. Alycia Rossetti with the patient's wishes and to have the CT at [facility] cancelled.  Plan to call patient at 2pm when she available at work to give her the info for the upcoming scan.

## 2016-01-30 NOTE — Telephone Encounter (Signed)
Called patient and discussed CT instructions.  She is to pick up her contrast when she sees Dr. Alvy Bimler.  She would also like to have a urine sample checked on that day since she did not have any symptoms with her previous UTI that lead to sepsis.  Orders placed.  Mailing a calender to the patient with her upcoming appts.  Advised to call for any needs or concerns.

## 2016-01-31 ENCOUNTER — Telehealth: Payer: Self-pay | Admitting: *Deleted

## 2016-01-31 ENCOUNTER — Telehealth: Payer: Self-pay | Admitting: Gynecologic Oncology

## 2016-01-31 NOTE — Telephone Encounter (Signed)
Pt called Joylene John, NP to report increase in swelling bilat lower legs.  Pt reported Pitting edema which is tender and possibly swelling a little more in left leg than right leg. Pt cannot zip her boots up.  She says Dr. Alvy Bimler increased her dose of Lyrica recently and wonders if this could cause the swelling?

## 2016-01-31 NOTE — Telephone Encounter (Signed)
Informed pt of Dr. Calton Dach message.  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  She has to stand up all day at work.  She actually left early today due to the swelling.  She will try to keep her legs elevated as much as possible,  Cut out salt and see if it improves over the weekend.  If she does not see any improvement or if swelling recurs she will contact her Cardiologist for evaluation.

## 2016-01-31 NOTE — Telephone Encounter (Signed)
Patient called with complaints of moderate bilateral lower extremity edema, appearing to be more on the left.  She states she noticed the swelling two days ago.  She could not zip her boots this am.  She reports both legs as tender and with pitting edema.  She states her lyrica dose was increased at the beginning of the week but she does not feel that it is assisting with her symptoms.  Spoke with Dr. Calton Dach RN about patient's complaints to see if Dr. Alvy Bimler would like to proceed with a doppler or adjust/discontinue Lyrica dose.  Dr. Calton Dach RN to call the patient with her recommendations.

## 2016-01-31 NOTE — Telephone Encounter (Signed)
No, Lyrica should not cause this Is she sitting a lot? Salt intake? Weight gain? Try simple weighing herself, moving a lot more and cut all salt (pickles, chips, etc) If still a concern, she may need repeat eval with her cardiologist

## 2016-02-06 ENCOUNTER — Other Ambulatory Visit: Payer: BC Managed Care – PPO

## 2016-02-10 NOTE — Progress Notes (Signed)
Counselor emailed patient to introduce counseling services as follow-up to voicemail left on 01/28/2016.  Lamount Cohen, Counseling Intern Department for Spiritual Care and [facility] Supervisor - Scientist, research (physical sciences)

## 2016-02-13 ENCOUNTER — Other Ambulatory Visit: Payer: BC Managed Care – PPO

## 2016-02-18 ENCOUNTER — Telehealth: Payer: Self-pay | Admitting: Hematology and Oncology

## 2016-02-18 ENCOUNTER — Ambulatory Visit (HOSPITAL_BASED_OUTPATIENT_CLINIC_OR_DEPARTMENT_OTHER): Payer: BC Managed Care – PPO

## 2016-02-18 ENCOUNTER — Ambulatory Visit (HOSPITAL_BASED_OUTPATIENT_CLINIC_OR_DEPARTMENT_OTHER): Payer: BC Managed Care – PPO | Admitting: Hematology and Oncology

## 2016-02-18 ENCOUNTER — Other Ambulatory Visit (HOSPITAL_BASED_OUTPATIENT_CLINIC_OR_DEPARTMENT_OTHER): Payer: BC Managed Care – PPO

## 2016-02-18 ENCOUNTER — Other Ambulatory Visit: Payer: Self-pay | Admitting: Hematology and Oncology

## 2016-02-18 ENCOUNTER — Encounter: Payer: Self-pay | Admitting: Hematology and Oncology

## 2016-02-18 VITALS — BP 136/97 | HR 88 | Temp 98.1°F | Resp 20 | Ht 63.0 in | Wt 196.3 lb

## 2016-02-18 DIAGNOSIS — C563 Malignant neoplasm of bilateral ovaries: Secondary | ICD-10-CM

## 2016-02-18 DIAGNOSIS — B373 Candidiasis of vulva and vagina: Secondary | ICD-10-CM | POA: Diagnosis not present

## 2016-02-18 DIAGNOSIS — Z452 Encounter for adjustment and management of vascular access device: Secondary | ICD-10-CM | POA: Diagnosis not present

## 2016-02-18 DIAGNOSIS — C562 Malignant neoplasm of left ovary: Secondary | ICD-10-CM | POA: Diagnosis not present

## 2016-02-18 DIAGNOSIS — D709 Neutropenia, unspecified: Secondary | ICD-10-CM

## 2016-02-18 DIAGNOSIS — Z87440 Personal history of urinary (tract) infections: Secondary | ICD-10-CM

## 2016-02-18 DIAGNOSIS — D61818 Other pancytopenia: Secondary | ICD-10-CM

## 2016-02-18 DIAGNOSIS — G620 Drug-induced polyneuropathy: Secondary | ICD-10-CM | POA: Diagnosis not present

## 2016-02-18 DIAGNOSIS — C561 Malignant neoplasm of right ovary: Secondary | ICD-10-CM

## 2016-02-18 DIAGNOSIS — R300 Dysuria: Secondary | ICD-10-CM

## 2016-02-18 DIAGNOSIS — T451X5A Adverse effect of antineoplastic and immunosuppressive drugs, initial encounter: Principal | ICD-10-CM

## 2016-02-18 DIAGNOSIS — C569 Malignant neoplasm of unspecified ovary: Secondary | ICD-10-CM

## 2016-02-18 DIAGNOSIS — B3731 Acute candidiasis of vulva and vagina: Secondary | ICD-10-CM

## 2016-02-18 DIAGNOSIS — R5081 Fever presenting with conditions classified elsewhere: Secondary | ICD-10-CM

## 2016-02-18 LAB — URINALYSIS, MICROSCOPIC - CHCC
Bilirubin (Urine): NEGATIVE
Blood: NEGATIVE
Glucose: NEGATIVE mg/dL
Ketones: NEGATIVE mg/dL
NITRITE: NEGATIVE
Protein: NEGATIVE mg/dL
Specific Gravity, Urine: 1.01 (ref 1.003–1.035)
Urobilinogen, UR: 0.2 mg/dL (ref 0.2–1)
pH: 6 (ref 4.6–8.0)

## 2016-02-18 LAB — COMPREHENSIVE METABOLIC PANEL
ALBUMIN: 4.5 g/dL (ref 3.5–5.0)
ALT: 20 U/L (ref 0–55)
AST: 16 U/L (ref 5–34)
Alkaline Phosphatase: 83 U/L (ref 40–150)
Anion Gap: 12 mEq/L — ABNORMAL HIGH (ref 3–11)
BUN: 18.7 mg/dL (ref 7.0–26.0)
CO2: 23 mEq/L (ref 22–29)
CREATININE: 0.7 mg/dL (ref 0.6–1.1)
Calcium: 9.5 mg/dL (ref 8.4–10.4)
Chloride: 100 mEq/L (ref 98–109)
EGFR: >90 mL/min/{1.73_m2} (ref >90)
GLUCOSE: 89 mg/dL (ref 70–140)
Potassium: 3.6 mEq/L (ref 3.5–5.1)
Sodium: 135 mEq/L — ABNORMAL LOW (ref 136–145)
Total Bilirubin: 0.39 mg/dL (ref 0.20–1.20)
Total Protein: 7.7 g/dL (ref 6.4–8.3)

## 2016-02-18 LAB — CBC WITH DIFFERENTIAL/PLATELET
BASO%: 0.5 % (ref 0.0–2.0)
Basophils Absolute: 0 10*3/uL (ref 0.0–0.1)
EOS%: 2.1 % (ref 0.0–7.0)
Eosinophils Absolute: 0.2 10*3/uL (ref 0.0–0.5)
HEMATOCRIT: 35.6 % (ref 34.8–46.6)
HEMOGLOBIN: 12.4 g/dL (ref 11.6–15.9)
LYMPH#: 1.2 10*3/uL (ref 0.9–3.3)
LYMPH%: 15 % (ref 14.0–49.7)
MCH: 36.2 pg — ABNORMAL HIGH (ref 25.1–34.0)
MCHC: 35 g/dL (ref 31.5–36.0)
MCV: 103.6 fL — ABNORMAL HIGH (ref 79.5–101.0)
MONO#: 0.7 10*3/uL (ref 0.1–0.9)
MONO%: 8.1 % (ref 0.0–14.0)
NEUT#: 6.1 10*3/uL (ref 1.5–6.5)
NEUT%: 74.3 % (ref 38.4–76.8)
Platelets: 227 10*3/uL (ref 145–400)
RBC: 3.43 10*6/uL — ABNORMAL LOW (ref 3.70–5.45)
RDW: 16.2 % — AB (ref 11.2–14.5)
WBC: 8.2 10*3/uL (ref 3.9–10.3)

## 2016-02-18 MED ORDER — OXYCODONE HCL 10 MG PO TABS: 10.0000 mg | ORAL_TABLET | Freq: Four times a day (QID) | ORAL | 0 refills | Status: DC | PRN

## 2016-02-18 MED ORDER — HEPARIN SOD (PORK) LOCK FLUSH 100 UNIT/ML IV SOLN
500.0000 [IU] | Freq: Once | INTRAVENOUS | Status: AC | PRN
Start: 2016-02-18 — End: 2016-02-18
  Administered 2016-02-18: 500 [IU] via INTRAVENOUS
  Filled 2016-02-18: qty 5

## 2016-02-18 MED ORDER — SODIUM CHLORIDE 0.9% FLUSH
10.0000 mL | INTRAVENOUS | Status: DC | PRN
  Administered 2016-02-18: 10 mL via INTRAVENOUS
  Filled 2016-02-18: qty 10

## 2016-02-18 MED ORDER — ALBUTEROL SULFATE HFA 108 (90 BASE) MCG/ACT IN AERS: 2.0000 | INHALATION_SPRAY | Freq: Four times a day (QID) | RESPIRATORY_TRACT | 11 refills | Status: DC | PRN

## 2016-02-18 MED FILL — oxyCODONE HCL 10 MG TABS: 10 | 22 days supply | Qty: 90 | Fill #0

## 2016-02-18 NOTE — Assessment & Plan Note (Signed)
This is improving since discontinuation of chemotherapy. She continues to take Lyrica. She has finished recent prescription of Dilaudid. She would like something milder. I recommend taking oxycodone as needed. I warn her about the risk of constipation. We will continue prescription management and taper course in the future

## 2016-02-18 NOTE — Progress Notes (Signed)
[place] OFFICE PROGRESS NOTE  Patient Care Team: Provider Not In System as PCP - General  SUMMARY OF ONCOLOGIC HISTORY:   Ovarian cancer ([place])   07/13/2014 Initial Diagnosis    Cancer of ovary, IIIA      07/13/2014 Surgery    RSO, staging       Ovarian cancer, bilateral ([place])   06/19/2014 Imaging    Complex cystic mass is identified within the pelvis. This is likely arising from the right ovary. The cyst is complicated by internal areas of septation and large dystrophic calcifications. This likely represents an ovarian tumor. 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 No macroscopic fat however is identified. Surgical evaluation is recommended. 2. Nephrolithiasis. 3. Left kidney scarring.      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 (please see synoptic report and comment)  B:Pelvic adhesion, excision - Involved by serous carcinoma, largest focus measures 0.7 cm  C:Omentum, omentectomy - Involved by serous carcinoma, focus measures 0.3 cm   D:Peritoneum, pelvic nodule, excision  - No serous carcinoma identified - Fibroadipose tissue with chronic inflammation and focal calcification  Extensive lymph node dissection showed no evidence of lymph node involvement      07/15/2014 Surgery    She underwent SALPINGO-OOPHORECTOMY, COMPLETE OR PARTIAL, UNILATERAL OR BILATERAL (SEPARATE PROCEDURE) OMENTECTOMY, EPIPLOECTOMY, RESECTION OF OMENTUM LIMITED LYMPHADENECTOMY FOR STAGING (SEPARATE PROCEDURE); PELVIC AND PARA-AORTIC URETEROLYSIS, WITH OR WITHOUT REPOSITIONING OF URETER FOR RETROPERITONEAL FIBROSIS          08/10/2014 - 12/10/2014 Chemotherapy    She received 6 cycles of adjuvant treatment with carboplatin and Taxol       12/24/2014  Imaging    Outside CT scan of the abdomen and pelvis show no evidence of residual disease      07/19/2015 Pathology Results    Outside pathology: Left ovary - Invasive high grade serous carcinoma with associated psammoma bodies in a background of serous borderline tumor, 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) (see comment) - Largest invasive focus measures 0.6 cm (overall tumor is 3.9 cm) - Surface of the ovary is involved - Background follicular cysts, some hemorrhagic  Left fallopian tube - No carcinoma identified - Unremarkable fallopian tube with paratubal serous cysts - Fallopian tube adherent to left ovary      07/19/2015 Surgery    She underwent tobotic Laparoscopy, Surgical, With Total Hysterectomy, For Uterus 250 G Or Less; W/Removal Tube(S) And/Or Ovary(S)         08/12/2015 - 12/16/2015 Chemotherapy    She received adjuvant carbo/taxol through [facility] x 6 cycles       08/20/2015 Miscellaneous    She was evaluated by genetics testing and results are negative      11/28/2015 - 12/02/2015 Hospital Admission    She was admitted to the hospital with neutropenic sepsis and severe pancytopenia.      12/02/2015 Imaging    Focal area of altered perfusion in the interpolar right kidney. Pyelonephritis would be a consideration. 2. Nonobstructing left renal stones with associated scarring. 3. Stable appearance right renal cysts.      01/01/2016 Imaging    Ct abdomen and pelvis done at [facility] showed pulmonary groundglass opacities and scattered subcentimeter lung nodules. These are indeterminate. The differential diagnosis includes posttreatment changes, atypical infection and/or metastasis. Short-term follow-up  is recommended to ensure stability / resolution. No evidence of metastatic disease in the abdomen and pelvis.       INTERVAL HISTORY: Please see below for problem oriented charting. She returns for further follow-up. She complained of  vaginal itching with mild discharge. She felt this is compatible with her prior vaginal yeast infection. She has return to work. She complained of fatigue during work. She is exercising and attempting to lose weight. Her peripheral neuropathy is less. She continues to have mild leg pain from prior side effects of treatment, drastically reduced since the last time I saw her. She has intermittent hot flashes  REVIEW OF SYSTEMS:   Constitutional: Denies fevers, chills or abnormal weight loss Eyes: Denies blurriness of vision Ears, nose, mouth, throat, and face: Denies mucositis or sore throat Respiratory: Denies cough, dyspnea or wheezes Cardiovascular: Denies palpitation, chest discomfort or lower extremity swelling Gastrointestinal:  Denies nausea, heartburn or change in bowel habits Skin: Denies abnormal skin rashes Lymphatics: Denies new lymphadenopathy or easy bruising Neurological:Denies numbness, tingling or new weaknesses Behavioral/Psych: Mood is stable, no new changes  All other systems were reviewed with the patient and are negative.  I have reviewed the past medical history, past surgical history, social history and family history with the patient and they are unchanged from previous note.  ALLERGIES:  is allergic to lisinopril.  MEDICATIONS:  Current Outpatient Prescriptions  Medication Sig Dispense Refill  . acetaminophen (TYLENOL) 500 MG tablet Take 1,000 mg by mouth every 6 (six) hours as needed for mild pain, moderate pain, fever or headache.    . ALPRAZolam (XANAX) 1 MG tablet Take 1 mg by mouth 3 (three) times daily as needed for anxiety.    Marland Kitchen amphetamine-dextroamphetamine (ADDERALL XR) 30 MG 24 hr capsule Take 30 mg by mouth daily with breakfast. Only takes when she is at school    . amphetamine-dextroamphetamine (ADDERALL) 20 MG tablet Take 20 mg by mouth daily with lunch. Only takes when she is at school    . aspirin 81 MG chewable tablet Chew 81 mg by mouth daily  with breakfast.     . atorvastatin (LIPITOR) 40 MG tablet Take 40 mg by mouth at bedtime.     Marland Kitchen b complex vitamins tablet Take 1 tablet by mouth daily.    . cetirizine (ZYRTEC) 10 MG tablet Take 10 mg by mouth daily with breakfast.     . fluconazole (DIFLUCAN) 150 MG tablet Take 150 mg by mouth once. And one in 72 hours    . ibuprofen (ADVIL,MOTRIN) 200 MG tablet Take 400 mg by mouth every 6 (six) hours as needed for fever, headache, mild pain, moderate pain or cramping.    . lidocaine-prilocaine (EMLA) cream Apply 1 application topically as needed. 30 g 6  . losartan (COZAAR) 50 MG tablet Take 100 mg by mouth daily with breakfast.     . magnesium oxide (MAGNESIUM-OXIDE) 400 (241.3 Mg) MG tablet Take 400 mg by mouth 2 (two) times daily.    . metoprolol succinate (TOPROL-XL) 100 MG 24 hr tablet Take 100 mg by mouth daily with breakfast. Take with or immediately following a meal.     . Multiple Vitamin (MULTIVITAMIN WITH MINERALS) TABS tablet Take 1 tablet by mouth daily with breakfast.     . nitroGLYCERIN (NITROSTAT) 0.4 MG SL tablet Place 0.4 mg under the tongue every 5 (five) minutes as needed for chest pain.    . Omega-3 Fatty Acids (FISH OIL PO) Take 1 capsule by  mouth daily.    . prasugrel (EFFIENT) 10 MG TABS tablet Take 10 mg by mouth daily with breakfast.     . pregabalin (LYRICA) 50 MG capsule Take 1 capsule (50 mg total) by mouth 2 (two) times daily. 60 capsule 9  . venlafaxine XR (EFFEXOR-XR) 75 MG 24 hr capsule Take 75 mg by mouth.    Marland Kitchen albuterol (PROVENTIL HFA;VENTOLIN HFA) 108 (90 Base) MCG/ACT inhaler Inhale 2 puffs into the lungs every 6 (six) hours as needed for wheezing or shortness of breath. 1 Inhaler 11  . HYDROmorphone (DILAUDID) 4 MG tablet Take 1 tablet (4 mg total) by mouth every 6 (six) hours as needed for severe pain. (Patient not taking: Reported on 02/18/2016) 90 tablet 0  . oxyCODONE 10 MG TABS Take 1 tablet (10 mg total) by mouth every 6 (six) hours as needed for severe  pain. 90 tablet 0  . phenazopyridine (PYRIDIUM) 200 MG tablet Take 1 tablet (200 mg total) by mouth 3 (three) times daily. (Patient not taking: Reported on 01/27/2016) 6 tablet 0   No current facility-administered medications for this visit.    Facility-Administered Medications Ordered in Other Visits  Medication Dose Route Frequency Provider Last Rate Last Dose  . sodium chloride flush (NS) 0.9 % injection 10 mL  10 mL Intravenous PRN Nicholas Lose, MD   10 mL at 12/30/15 1022  . sodium chloride flush (NS) 0.9 % injection 10 mL  10 mL Intravenous PRN Nicholas Lose, MD   10 mL at 02/18/16 1304    PHYSICAL EXAMINATION: ECOG PERFORMANCE STATUS: 1 - Symptomatic but completely ambulatory  Vitals:   02/18/16 1246  BP: (!) 136/97  Pulse: 88  Resp: 20  Temp: 98.1 F (36.7 C)   Filed Weights   02/18/16 1246  Weight: 196 lb 4.8 oz (89 kg)    GENERAL:alert, no distress and comfortable SKIN: skin color, texture, turgor are normal, no rashes or significant lesions EYES: normal, Conjunctiva are pink and non-injected, sclera clear Musculoskeletal:no cyanosis of digits and no clubbing  NEURO: alert & oriented x 3 with fluent speech, no focal motor/sensory deficits  LABORATORY DATA:  I have reviewed the data as listed    Component Value Date/Time   NA 135 (L) 02/18/2016 1250   K 3.6 02/18/2016 1250   CL 104 12/05/2015 0123   CO2 23 02/18/2016 1250   GLUCOSE 89 02/18/2016 1250   BUN 18.7 02/18/2016 1250   CREATININE 0.7 02/18/2016 1250   CALCIUM 9.5 02/18/2016 1250   PROT 7.7 02/18/2016 1250   ALBUMIN 4.5 02/18/2016 1250   AST 16 02/18/2016 1250   ALT 20 02/18/2016 1250   ALKPHOS 83 02/18/2016 1250   BILITOT 0.39 02/18/2016 1250   GFRNONAA >60 12/05/2015 0123   GFRAA >60 12/05/2015 0123    No results found for: SPEP, UPEP  Lab Results  Component Value Date   WBC 8.2 02/18/2016   NEUTROABS 6.1 02/18/2016   HGB 12.4 02/18/2016   HCT 35.6 02/18/2016   MCV 103.6 (H) 02/18/2016    PLT 227 02/18/2016      Chemistry      Component Value Date/Time   NA 135 (L) 02/18/2016 1250   K 3.6 02/18/2016 1250   CL 104 12/05/2015 0123   CO2 23 02/18/2016 1250   BUN 18.7 02/18/2016 1250   CREATININE 0.7 02/18/2016 1250      Component Value Date/Time   CALCIUM 9.5 02/18/2016 1250   ALKPHOS 83 02/18/2016 1250   AST  16 02/18/2016 1250   ALT 20 02/18/2016 1250   BILITOT 0.39 02/18/2016 1250       ASSESSMENT & PLAN:  Ovarian cancer, bilateral ([place]) The patient completed 6 cycles of adjuvant treatment through [facility]. She is recovering from her side effects of treatment. I will defer to her GYN surgeon related to CT imaging She was recently seen by cancer survivorship clinic. Appreciated the consult We discussed increase physical activity and possible enrollment with the [employer] program with the [facility]. She is delighted to see that she managed to lose some weight since discontinuation of chemotherapy. She is working hard with dietary modification and I continued to encourage her and praised her for great effort I recommend we keep the port patent. I will see her back in 6 weeks for further supportive care  Peripheral neuropathy due to chemotherapy [facility]) This is improving since discontinuation of chemotherapy. She continues to take Lyrica. She has finished recent prescription of Dilaudid. She would like something milder. I recommend taking oxycodone as needed. I warn her about the risk of constipation. We will continue prescription management and taper course in the future  Vaginal yeast infection She has signs and symptoms of vaginal yeast infection. She has called her other doctor and has received prescription of fluconazole. I encouraged her to take the prescription as directed   No orders of the defined types were placed in this encounter.  All questions were answered. The patient knows to call the clinic with any problems, questions or concerns. No  barriers to learning was detected. I spent 15 minutes counseling the patient face to face. The total time spent in the appointment was 20 minutes and more than 50% was on counseling and review of test results     Heath Lark, MD 02/18/2016 2:10 PM

## 2016-02-18 NOTE — Assessment & Plan Note (Addendum)
The patient completed 6 cycles of adjuvant treatment through [facility]. She is recovering from her side effects of treatment. I will defer to her GYN surgeon related to CT imaging She was recently seen by cancer survivorship clinic. Appreciated the consult We discussed increase physical activity and possible enrollment with the [employer] program with the [facility]. She is delighted to see that she managed to lose some weight since discontinuation of chemotherapy. She is working hard with dietary modification and I continued to encourage her and praised her for great effort I recommend we keep the port patent. I will see her back in 6 weeks for further supportive care

## 2016-02-18 NOTE — Assessment & Plan Note (Signed)
She has signs and symptoms of vaginal yeast infection. She has called her other doctor and has received prescription of fluconazole. I encouraged her to take the prescription as directed

## 2016-02-18 NOTE — Telephone Encounter (Signed)
Appointments scheduled per 1/30 LOS. Patient given AVS report and calendars with future scheduled appointments along with two bottles of contrast and instructions for CT scan appointment.

## 2016-02-20 ENCOUNTER — Other Ambulatory Visit: Payer: BC Managed Care – PPO

## 2016-02-20 ENCOUNTER — Telehealth: Payer: Self-pay | Admitting: *Deleted

## 2016-02-20 LAB — URINE CULTURE

## 2016-02-20 MED ORDER — CIPROFLOXACIN HCL 250 MG PO TABS: 250.0000 mg | ORAL_TABLET | Freq: Two times a day (BID) | ORAL | 0 refills | Status: DC

## 2016-02-20 NOTE — Telephone Encounter (Signed)
LM with note below. To call if has questions 

## 2016-02-20 NOTE — Telephone Encounter (Signed)
-----   Message from Heath Lark, MD sent at 02/20/2016  2:07 PM EST ----- Regarding: urine test Looks like her urine test came back abnormal, looks like UTI Please call in cipro 250 mg BID PO x 7 days ----- Message ----- From: Interface, Lab In Three Zero One Sent: 02/18/2016   1:13 PM To: Heath Lark, MD

## 2016-02-21 ENCOUNTER — Telehealth: Payer: Self-pay | Admitting: Hematology and Oncology

## 2016-02-21 ENCOUNTER — Encounter (HOSPITAL_COMMUNITY): Payer: Self-pay

## 2016-02-21 ENCOUNTER — Other Ambulatory Visit: Payer: Self-pay | Admitting: Hematology and Oncology

## 2016-02-21 ENCOUNTER — Ambulatory Visit (HOSPITAL_COMMUNITY)
Admission: RE | Admit: 2016-02-21 | Discharge: 2016-02-21 | Disposition: A | Discharge status: Home / Self Care | Payer: BC Managed Care – PPO | Source: Ambulatory Visit | Attending: Gynecologic Oncology | Admitting: Gynecologic Oncology

## 2016-02-21 DIAGNOSIS — C561 Malignant neoplasm of right ovary: Secondary | ICD-10-CM

## 2016-02-21 DIAGNOSIS — Z90722 Acquired absence of ovaries, bilateral: Secondary | ICD-10-CM | POA: Diagnosis not present

## 2016-02-21 DIAGNOSIS — N200 Calculus of kidney: Secondary | ICD-10-CM | POA: Insufficient documentation

## 2016-02-21 DIAGNOSIS — Z90710 Acquired absence of both cervix and uterus: Secondary | ICD-10-CM | POA: Diagnosis not present

## 2016-02-21 DIAGNOSIS — R935 Abnormal findings on diagnostic imaging of other abdominal regions, including retroperitoneum: Secondary | ICD-10-CM | POA: Diagnosis not present

## 2016-02-21 DIAGNOSIS — R918 Other nonspecific abnormal finding of lung field: Secondary | ICD-10-CM

## 2016-02-21 DIAGNOSIS — R911 Solitary pulmonary nodule: Secondary | ICD-10-CM | POA: Insufficient documentation

## 2016-02-21 MED ORDER — CIPROFLOXACIN HCL 250 MG PO TABS: 250.0000 mg | ORAL_TABLET | Freq: Two times a day (BID) | ORAL | 0 refills | Status: DC

## 2016-02-21 MED ORDER — IOPAMIDOL (ISOVUE-300) INJECTION 61%
100.0000 mL | Freq: Once | INTRAVENOUS | Status: AC | PRN
  Administered 2016-02-21: 100 mL via INTRAVENOUS

## 2016-02-21 MED ORDER — SODIUM CHLORIDE 0.9 % IJ SOLN
INTRAMUSCULAR | Status: AC
  Filled 2016-02-21: qty 50

## 2016-02-21 MED ORDER — IOPAMIDOL (ISOVUE-300) INJECTION 61%
INTRAVENOUS | Status: AC
  Filled 2016-02-21: qty 100

## 2016-02-21 NOTE — Telephone Encounter (Signed)
I reviewed the CT imaging with the patient over the phone. She is noted to have multiple lung nodules. The lung nodules were noted on prior CT scan. She has evidence of hernia near her abdominal wall but the patient is not symptomatic. She had a recent urinary tract infection but has not picked up her prescription. I call in prescription to her local pharmacy. She has evidence of multiple kidney stones on the CT scan and I recommend urology consultation. I suspect they may be the cause of her recurrent UTI. She agreed with the referral.  Finally, I discussed with her the GOG 0225 study and she is interested. The research nurse will reach out to her.

## 2016-02-24 ENCOUNTER — Telehealth: Payer: Self-pay

## 2016-02-24 NOTE — Telephone Encounter (Signed)
Called referral to [facility], they will call patient.

## 2016-02-25 ENCOUNTER — Ambulatory Visit (HOSPITAL_COMMUNITY)
Admission: EM | Admit: 2016-02-25 | Discharge: 2016-02-25 | Disposition: A | Discharge status: Home / Self Care | Payer: BC Managed Care – PPO | Attending: Family Medicine | Admitting: Family Medicine

## 2016-02-25 ENCOUNTER — Encounter (HOSPITAL_COMMUNITY): Payer: Self-pay | Admitting: Family Medicine

## 2016-02-25 DIAGNOSIS — J40 Bronchitis, not specified as acute or chronic: Secondary | ICD-10-CM | POA: Diagnosis not present

## 2016-02-25 MED ORDER — HYDROCODONE-HOMATROPINE 5-1.5 MG/5ML PO SYRP: 5.0000 mL | ORAL_SOLUTION | Freq: Four times a day (QID) | ORAL | 0 refills | Status: DC | PRN

## 2016-02-25 MED ORDER — AZITHROMYCIN 250 MG PO TABS: 250.0000 mg | ORAL_TABLET | Freq: Every day | ORAL | 0 refills | Status: DC

## 2016-02-25 NOTE — ED Triage Notes (Signed)
C/o body ache States she has a cough Cough is productive with yellowish mucous  Not sleeping well at night  Feeling fatigue

## 2016-02-25 NOTE — ED Provider Notes (Signed)
[place]    CSN: [id number] Arrival date & time: 02/25/16  1001     History   Chief Complaint Chief Complaint  Patient presents with  . Generalized Body Aches    HPI Shelley Vega is a 36 y.o. female.   This is a 36 year old woman with 5 days of productive cough, chills, and sweats. She's recently completed a course of chemotherapy for ovarian cancer. 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  Patient did have runny nose initially and has had a sore throat.      Past Medical History:  Diagnosis Date  . Heart attack   . Ovarian cancer [facility]) 2016   right ovary    Patient Active Problem List   Diagnosis Date Noted  . Kidney stone on left side 02/21/2016  . Dysuria 02/18/2016  . Vaginal yeast infection 02/18/2016  . Cough in adult 01/06/2016  . Dehydration 12/30/2015  . Mucositis due to antineoplastic therapy 12/23/2015  . Hypokalemia 12/23/2015  . Peripheral neuropathy due to chemotherapy ([place]) 12/10/2015  . Ovarian cancer, bilateral ([place]) 12/09/2015  . Severe sepsis ([place]) 11/28/2015  . Hypotension 11/28/2015  . Neutropenia with fever ([place]) 11/28/2015  . Pyuria 11/28/2015  . Pancytopenia ([place])- due to chemoRx 11/28/2015  . Neutropenic fever ([place]) 11/28/2015  . Personal history of immunosupression therapy   . Genetic testing 08/21/2014  . Family history of breast cancer in female 08/21/2014  . Family history of cancer 08/21/2014  . CAD (coronary artery disease) - sp cor stent x 2 07/25/2014  . Dyslipidemia 07/25/2014  . Ovarian cancer ([place]) 07/23/2014  . Post-operative state 07/15/2014  . Apnea, sleep 06/13/2013  . Acute non-ST segment elevation myocardial infarction ([place]) 05/09/2013  . ADD (attention deficit disorder) 04/25/2013  . Airway hyperreactivity 04/25/2013  . BP (high blood pressure) 04/25/2013  . Class 1 obesity 04/25/2013    Past Surgical History:  Procedure Laterality Date  . ABDOMINAL HYSTERECTOMY    . BREAST  REDUCTION SURGERY    . CORONARY ANGIOPLASTY WITH STENT PLACEMENT    . MOUTH SURGERY      OB History    No data available       Home Medications    Prior to Admission medications   Medication Sig Start Date End Date Taking? Authorizing Provider  acetaminophen (TYLENOL) 500 MG tablet Take 1,000 mg by mouth every 6 (six) hours as needed for mild pain, moderate pain, fever or headache.   Yes Historical Provider, MD  albuterol (PROVENTIL HFA;VENTOLIN HFA) 108 (90 Base) MCG/ACT inhaler Inhale 2 puffs into the lungs every 6 (six) hours as needed for wheezing or shortness of breath. 02/18/16  Yes Heath Lark, MD  ALPRAZolam Duanne Moron) 1 MG tablet Take 1 mg by mouth 3 (three) times daily as needed for anxiety.   Yes Historical Provider, MD  amphetamine-dextroamphetamine (ADDERALL XR) 30 MG 24 hr capsule Take 30 mg by mouth daily with breakfast. Only takes when she is at school   Yes Historical Provider, MD  amphetamine-dextroamphetamine (ADDERALL) 20 MG tablet Take 20 mg by mouth daily with lunch. Only takes when she is at school   Yes Historical Provider, MD  aspirin 81 MG chewable tablet Chew 81 mg by mouth daily with breakfast.    Yes Historical Provider, MD  atorvastatin (LIPITOR) 40 MG tablet Take 40 mg by mouth at bedtime.    Yes Historical Provider, MD  b complex vitamins tablet Take 1 tablet by mouth daily.   Yes Historical  Provider, MD  cetirizine (ZYRTEC) 10 MG tablet Take 10 mg by mouth daily with breakfast.    Yes Historical Provider, MD  ciprofloxacin (CIPRO) 250 MG tablet Take 1 tablet (250 mg total) by mouth 2 (two) times daily. 02/20/16  Yes Heath Lark, MD  ciprofloxacin (CIPRO) 250 MG tablet Take 1 tablet (250 mg total) by mouth 2 (two) times daily. 02/21/16  Yes Heath Lark, MD  fluconazole (DIFLUCAN) 150 MG tablet Take 150 mg by mouth once. And one in 72 hours 02/17/16  Yes Historical Provider, MD  HYDROmorphone (DILAUDID) 4 MG tablet Take 1 tablet (4 mg total) by mouth every 6 (six) hours  as needed for severe pain. 01/23/16  Yes Heath Lark, MD  ibuprofen (ADVIL,MOTRIN) 200 MG tablet Take 400 mg by mouth every 6 (six) hours as needed for fever, headache, mild pain, moderate pain or cramping.   Yes Historical Provider, MD  lidocaine-prilocaine (EMLA) cream Apply 1 application topically as needed. 12/09/15  Yes Heath Lark, MD  losartan (COZAAR) 50 MG tablet Take 100 mg by mouth daily with breakfast.    Yes Historical Provider, MD  magnesium oxide (MAGNESIUM-OXIDE) 400 (241.3 Mg) MG tablet Take 400 mg by mouth 2 (two) times daily.   Yes Historical Provider, MD  metoprolol succinate (TOPROL-XL) 100 MG 24 hr tablet Take 100 mg by mouth daily with breakfast. Take with or immediately following a meal.    Yes Historical Provider, MD  Multiple Vitamin (MULTIVITAMIN WITH MINERALS) TABS tablet Take 1 tablet by mouth daily with breakfast.    Yes Historical Provider, MD  nitroGLYCERIN (NITROSTAT) 0.4 MG SL tablet Place 0.4 mg under the tongue every 5 (five) minutes as needed for chest pain.   Yes Historical Provider, MD  Omega-3 Fatty Acids (FISH OIL PO) Take 1 capsule by mouth daily.   Yes Historical Provider, MD  oxyCODONE 10 MG TABS Take 1 tablet (10 mg total) by mouth every 6 (six) hours as needed for severe pain. 02/18/16  Yes Heath Lark, MD  phenazopyridine (PYRIDIUM) 200 MG tablet Take 1 tablet (200 mg total) by mouth 3 (three) times daily. 12/05/15  Yes Antonietta Breach, PA-C  prasugrel (EFFIENT) 10 MG TABS tablet Take 10 mg by mouth daily with breakfast.    Yes Historical Provider, MD  pregabalin (LYRICA) 50 MG capsule Take 1 capsule (50 mg total) by mouth 2 (two) times daily. 01/23/16  Yes Heath Lark, MD  venlafaxine XR (EFFEXOR-XR) 75 MG 24 hr capsule Take 75 mg by mouth. 12/16/15 06/13/16 Yes Historical Provider, MD  azithromycin (ZITHROMAX) 250 MG tablet Take 1 tablet (250 mg total) by mouth daily. Take first 2 tablets together, then 1 every day until finished. 02/25/16   Robyn Haber, MD    HYDROcodone-homatropine (HYDROMET) 5-1.5 MG/5ML syrup Take 5 mLs by mouth every 6 (six) hours as needed for cough. 02/25/16   Robyn Haber, MD    Family History Family History  Problem Relation Age of Onset  . Colon polyps Father     about 2-3 removed following colonoscopy  . Irritable bowel syndrome Brother     1 previous colonoscopy - no polyps found  . Breast cancer Paternal Aunt 26    "low level" breast cancer  . Prostate cancer Maternal Grandfather 5  . Lymphoma Maternal Grandfather 71    large B-cell lymphoma  . Heart Problems Paternal Grandmother   . Heart attack Paternal Grandfather   . Cancer Other 51    unknown type    Social  History Social History  Substance Use Topics  . Smoking status: Former Smoker    Packs/day: 1.00    Years: 8.00    Types: Cigarettes    Quit date: 08/19/2011  . Smokeless tobacco: Never Used  . 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  HENT: Positive for rhinorrhea and sore throat.   Respiratory: Positive for cough and wheezing.   Cardiovascular: Negative.   Gastrointestinal: Negative.   Genitourinary: Negative.   Musculoskeletal: Positive for arthralgias and myalgias.     Physical Exam Triage Vital Signs ED Triage Vitals [02/25/16 1015]  Enc Vitals Group     BP 111/78     Pulse Rate 71     Resp 16     Temp 99 F (37.2 C)     Temp Source Oral     SpO2 100 %     Weight      Height      Head Circumference      Peak Flow      Pain Score      Pain Loc      Pain Edu?      Excl. in [place]?    No data found.   Updated Vital Signs BP 111/78 (BP Location: Left Arm)   Pulse 71   Temp 99 F (37.2 C) (Oral)   Resp 16   LMP 08/27/2014   SpO2 100%    Physical Exam  Constitutional: She is oriented to person, place, and time. She appears well-developed and well-nourished.   HENT:  Head: Normocephalic.  Right Ear: External ear normal.  Left Ear: External ear normal.  Mouth/Throat: Oropharynx is clear and moist.  Eyes: Conjunctivae are normal. Pupils are equal, round, and reactive to light.  Neck: Normal range of motion. Neck supple.  Cardiovascular: Normal rate, regular rhythm and normal heart sounds.   Pulmonary/Chest: Effort normal. She has wheezes.  Musculoskeletal: Normal range of motion.  Neurological: She is alert and oriented to person, place, and time.  Skin: Skin is warm and dry.  Nursing note and vitals reviewed.    UC Treatments / Results  Labs (all labs ordered are listed, but only abnormal results are displayed) Labs Reviewed - No data to display  EKG  EKG Interpretation None       Radiology No results found.  Procedures Procedures (including critical care time)  Medications Ordered in UC Medications - No data to display   Initial Impression / Assessment and Plan / UC Course  I have reviewed the triage vital signs and the nursing notes.  Pertinent labs & imaging results that were available during my care of the patient were reviewed by me and considered in my medical decision making (see chart for details).     Final Clinical Impressions(s) / UC Diagnoses   Final diagnoses:  Bronchitis    New Prescriptions New Prescriptions   AZITHROMYCIN (ZITHROMAX) 250 MG TABLET    Take 1 tablet (250 mg total) by mouth daily. Take first 2 tablets together, then 1 every day until finished.   HYDROCODONE-HOMATROPINE (HYDROMET) 5-1.5 MG/5ML SYRUP    Take 5 mLs by mouth every 6 (six) hours as needed for cough.     Robyn Haber, MD 02/25/16 1053

## 2016-03-10 NOTE — Progress Notes (Signed)
Counseling intern left patient a voicemail message offering counseling services as follow-up to previous voicemail and email messages.  Lamount Cohen, Counseling Intern Department for Spiritual Care and [facility] Supervisor - Scientist, research (physical sciences)

## 2016-03-17 NOTE — Progress Notes (Signed)
Follow Up Note: Gyn-Onc Symptom Visit  Shelley Vega 36 y.o. female  CC:  Chief Complaint  Patient presents with  . Ovarian Cancer    HPI:  Shelley Vega is a 36 year old female initially seen in consultation at the request of [name] for evaluation of pelvic mass. She presented in May with some odor with urination, vaginal discharge and STD testing. She reports regular periods without intermenstrual bleeding that states they've become much heavier for the past couple of months. She was subsequently diagnosed with a large abdominal mass.  Patients cardiologist is Dr. Nolberto Hanlon [phone]) in [place] associated with [facility].   INTERVAL HISTORY:  Imaging:  CT A/P (06/19/14)  20cm pelvic mass arising from the right ovary- complex cystic mass complicated by internal areas of septation and large dystrophic calcifications. Favor ovarian teratoma. Trace fluid in pelvic, no peritoneal nodularity identified, no abdominal adenopathy, right pelvic lymph node measuring 1cm.  Oncology Summary:          Malignant neoplasm of right ovary (RAF-HCC)    07/13/2014  Initial Diagnosis  Malignant neoplasm of right ovary (RAF-HCC)    07/13/2014  Surgery  Exploratory laparotomy with right salpingoophorectomy, right ureterolysis, infracolic omentectomy, right pelvic and aortic lymph node dissection, and staging biopsies   Findings:  1) 20cm right adnexal mass adherent to the mesentery and right pelvic sidewall - frozen section evaluation showing at least borderline tumor with extensive calcifications  2) Retroperitoneal fibrosis requiring right ureterolysis  3) Normal abdominal survey with normal appendix, large bowel, small bowel (run from terminal ileum to ligament of Treitz), smooth diaphragms bilaterally, smooth liver edge, normal appearing omentum and stomach  4) Normal appearing left tube and ovary  5) Normal uterus      Final Diagnosis    A: Ovary, right, oophorectomy  - High grade serous  carcinoma with numerous associated psammoma bodies in a background of serous borderline tumor with micropapillary features (please see synoptic report and comment)  B: Pelvic adhesion, excision  - Involved by serous carcinoma, largest focus measures 0.7 cm  C: Omentum, omentectomy  - Involved by serous carcinoma, focus measures 0.3 cm  D: Peritoneum, pelvic nodule, excision  - No serous carcinoma identified  - Fibroadipose tissue with chronic inflammation and focal calcification  E: Lymph nodes, pelvic, right, resection  - Nine lymph nodes negative for metastatic carcinoma (0/9)  F: Peritoneum, posterior adhesion, biopsy  - No serous carcinoma identified  - Fibrovascular tissue with hemorrhage  G: Lymph node, para-aortic, excision  - Four lymph nodes negative for metastatic carcinoma (0/4)  H: Peritoneum, gutter, right, biopsy  - No serous carcinoma identified  - Fibroadipose tissue with chronic inflammation  I: Peritoneum, gutter, left, biopsy  - No serous carcinoma identified  - Fibroadipose tissue  Stage/Disposition: Shelley Vega is a 36 y.o. 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 Disposition is to carboplatin and taxol chemotherapy.  She completed all of her therapy and had a negative post treatment CT scan (12/24/14):  IMPRESSION: - No CT evidence of metastatic disease in the chest, abdomen, or pelvis. Previously described nodularity in the right adnexal region is slightly less prominent on current exam and may reflect postsurgical change.  She underwent genetic testing in [place] and is BRCA negative.  She wished to have completion surgery.  OPERATIVE REPORT  Date of Service: July 19, 2015  Findings:  1. Enlarged cystic left ovary measuring roughly 5cm  2. No nodularity noted  in the pelvis and abdomen  3. Liver edge with no evidence of disease  4. Treatment effect noted on sigmoid colon  5. Filmy adhesions noted in pelvis between small bowel, left  tube/ovary, and uterus  6. 3-4 cm umbilical hernia noted  7. Minimal adhesive disease noted anteriorly  8. Mirena IUD removed  A: Uterus, cervix, left fallopian tube and ovary, hysterectomy and left salpingo-oophorectomy  Cervix  - Squamous metaplasia and endocervical glandular dilatation  - No dysplasia or carcinoma identified  Endometrium  - Inactive endometrium with stromal decidualization, consistent with progestin effect  Myometrium  - Serosa involved by serous carcinoma  - Leiomyoma measuring 1.4 cm in greatest dimension  Left ovary  - Invasive high grade serous carcinoma with associated psammoma bodies in a background of serous borderline tumor, 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) (see comment)  - Largest invasive focus measures 0.6 cm (overall tumor is 3.9 cm)  - Surface of the ovary is involved  - Background follicular cysts, some hemorrhagic  Left fallopian tube  - No carcinoma identified  - Unremarkable fallopian tube with paratubal serous cysts  - Fallopian tube adherent to left ovary  She [facility] at [facility] on 10/21/15.  She received cycle 4 of carboplatin and taxol on 10/25/15.  Due to symptomatic anemia after, she received two units of PRBCs on 11/08/15.  She then received cycle 6 on 12/16/15.  Interval History:   CT 02/21/16: IMPRESSION: Status post hysterectomy and bilateral salpingo-oophorectomy.  Mild progressive soft tissue along the anterior abdominal wall in the midline periumbilical region, possible reflecting a small hernia containing a knuckle of small bowel, although indeterminate. Attention on follow-up is suggested to exclude a soft tissue implant in this region.  3 mm right middle lobe pulmonary nodule, likely benign. In the setting of known malignancy, consider follow-up CT chest in 6-12 months to document stability.  She and I reviewed her CT scan results. 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 She is very optimistic and has no complaints whatsoever. As we discussed her CT scan she would very much like a repeat scan in 3 months just for reassurance. She is worried about her 3 mm pulmonary nodule. I discussed with her the radiology report G recommendation was in 6-12 she would feel more comfortable if we move forward at this time. Considering that CA-125 is not a marker for her and that we also want to follow the area along the anterior abdominal wall I do not think this is unreasonable.  Review of Systems  Constitutional: Feels poorly.  Weak, fatigued, decreased appetite.  Cardiovascular: No chest pain or edema. Positive for shortness of breath with minimal exertion.  Pulmonary: Coughing that interferes with sleep.  Productive last pm.  No wheeze.  Gastrointestinal: No nausea, vomiting, or diarrhea. No bright red blood per rectum or change in bowel movement.  Genitourinary: DecreNo frequency, urgency, or dysuria. No vaginal bleeding or discharge.  Musculoskeletal: Positive for myalgias all over including joint pain. Neurologic: No numbness or change in gait. Positive for weakness. Psychology: No depression, anxiety.  Positive for insomnia related to coughing  Current Meds:  Outpatient Encounter Prescriptions as of 03/18/2016  Medication Sig  . acetaminophen (TYLENOL) 500 MG tablet Take 1,000 mg by mouth every 6 (six) hours as needed for mild pain, moderate pain, fever or headache.  . albuterol (PROVENTIL HFA;VENTOLIN  HFA) 108 (90 Base) MCG/ACT inhaler Inhale 2 puffs into the lungs every 6 (six) hours as needed for wheezing or shortness of breath.  . ALPRAZolam (XANAX) 1 MG tablet Take 1 mg by mouth 3 (three) times daily as needed for anxiety.  Marland Kitchen amphetamine-dextroamphetamine (ADDERALL XR) 30 MG 24 hr capsule Take 30 mg by mouth daily with breakfast. Only takes when she is at school  .  amphetamine-dextroamphetamine (ADDERALL) 20 MG tablet Take 20 mg by mouth daily with lunch. Only takes when she is at school  . aspirin 81 MG chewable tablet Chew 81 mg by mouth daily with breakfast.   . atorvastatin (LIPITOR) 40 MG tablet Take 40 mg by mouth at bedtime.   Marland Kitchen azithromycin (ZITHROMAX) 250 MG tablet Take 1 tablet (250 mg total) by mouth daily. Take first 2 tablets together, then 1 every day until finished.  . b complex vitamins tablet Take 1 tablet by mouth daily.  . cetirizine (ZYRTEC) 10 MG tablet Take 10 mg by mouth daily with breakfast.   . ibuprofen (ADVIL,MOTRIN) 200 MG tablet Take 400 mg by mouth every 6 (six) hours as needed for fever, headache, mild pain, moderate pain or cramping.  . lidocaine-prilocaine (EMLA) cream Apply 1 application topically as needed.  Marland Kitchen losartan (COZAAR) 50 MG tablet Take 100 mg by mouth daily with breakfast.   . magnesium oxide (MAGNESIUM-OXIDE) 400 (241.3 Mg) MG tablet Take 400 mg by mouth 2 (two) times daily.  . metoprolol succinate (TOPROL-XL) 100 MG 24 hr tablet Take 100 mg by mouth daily with breakfast. Take with or immediately following a meal.   . Multiple Vitamin (MULTIVITAMIN WITH MINERALS) TABS tablet Take 1 tablet by mouth daily with breakfast.   . Omega-3 Fatty Acids (FISH OIL PO) Take 1 capsule by mouth daily.  . prasugrel (EFFIENT) 10 MG TABS tablet Take 10 mg by mouth daily with breakfast.   . venlafaxine XR (EFFEXOR-XR) 75 MG 24 hr capsule Take 75 mg by mouth.  . nitroGLYCERIN (NITROSTAT) 0.4 MG SL tablet Place 0.4 mg under the tongue every 5 (five) minutes as needed for chest pain.  . [DISCONTINUED] ciprofloxacin (CIPRO) 250 MG tablet Take 1 tablet (250 mg total) by mouth 2 (two) times daily.  . [DISCONTINUED] ciprofloxacin (CIPRO) 250 MG tablet Take 1 tablet (250 mg total) by mouth 2 (two) times daily.  . [DISCONTINUED] fluconazole (DIFLUCAN) 150 MG tablet Take 150 mg by mouth once. And one in 72 hours  . [DISCONTINUED]  HYDROcodone-homatropine (HYDROMET) 5-1.5 MG/5ML syrup Take 5 mLs by mouth every 6 (six) hours as needed for cough.  . [DISCONTINUED] HYDROmorphone (DILAUDID) 4 MG tablet Take 1 tablet (4 mg total) by mouth every 6 (six) hours as needed for severe pain.  . [DISCONTINUED] oxyCODONE 10 MG TABS Take 1 tablet (10 mg total) by mouth every 6 (six) hours as needed for severe pain.  . [DISCONTINUED] phenazopyridine (PYRIDIUM) 200 MG tablet Take 1 tablet (200 mg total) by mouth 3 (three) times daily.  . [DISCONTINUED] pregabalin (LYRICA) 50 MG capsule Take 1 capsule (50 mg total) by mouth 2 (two) times daily.   Facility-Administered Encounter Medications as of 03/18/2016  Medication  . sodium chloride flush (NS) 0.9 % injection 10 mL    Allergy:  Allergies  Allergen Reactions  . Lisinopril Cough    Social Hx:   Social History   Social History  . Marital status: Single    Spouse name: N/A  . Number of children: N/A  .  Years of education: N/A   Occupational History  . Not on file.   Social History Main Topics  . Smoking status: Former Smoker    Packs/day: 1.00    Years: 8.00    Types: Cigarettes    Quit date: 08/19/2011  . Smokeless tobacco: Never Used  . 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 Drug use: Unknown  . Sexual activity: Not on file   Other Topics Concern  . Not on file   Social History Narrative  . No narrative on file    Past Surgical Hx:  Past Surgical History:  Procedure Laterality Date  . ABDOMINAL HYSTERECTOMY    . BREAST REDUCTION SURGERY    . CORONARY ANGIOPLASTY WITH STENT PLACEMENT    . MOUTH SURGERY      Past Medical Hx:  Past Medical History:  Diagnosis Date  . Heart attack   . Ovarian cancer ([place]) 2016   right ovary    Family Hx:  Family History  Problem Relation Age of Onset  . Colon polyps Father     about 2-3 removed following colonoscopy  . Irritable bowel syndrome Brother     1 previous colonoscopy -  no polyps found  . Breast cancer Paternal Aunt 27    "low level" breast cancer  . Prostate cancer Maternal Grandfather 57  . Lymphoma Maternal Grandfather 71    large B-cell lymphoma  . Heart Problems Paternal Grandmother   . Heart attack Paternal Grandfather   . Cancer Other 76    unknown type    Vitals:  Blood pressure 137/86, pulse 79, temperature 98.2 F (36.8 C), temperature source Oral, resp. rate 18, height 5' 3"  (1.6 m), weight 186 lb 6.4 oz (84.6 kg), last menstrual period 08/27/2014, SpO2 99 %.  Physical Exam:  General: Well developed, well nourished female In no acute distress.   Lymph node survey: No neck thyromegaly or lymphadenopathy. No supraclavicular, or inguinal adenopathy.   Cardiovascular: Regular rate and rhythm  Lungs: Clear to auscultation bilaterally. No wheezes/crackles/rhonchi noted.   Skin: No rashes or lesions present.  Abdomen: Abdomen soft, non-tender and obese. Active bowel sounds in all quadrants. No evidence of a fluid wave or abdominal masses. Well-healed vertical midline incision. No evidence of any incisional hernias but she has had laxity of at the level of the umbilicus that could either be a small developing hernia or dye stasis. There is no tenderness.  Extremities: No bilateral cyanosis, edema, or clubbing.   Pelvic: External genitalia within normal limits. Bimanual dissemination. Masses or nodularity. Rectal confirms.  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 We will follow up on the results of her CA-125 from today. I am not convinced that it will be a good marker for her as it was not elevated prior to her completion hysterectomy and she had disease at that time. Will get a CT CAP in 5/18 and this has been ordered to follow up on the pulmonary nodules as well as the abdominal wall indeterminate area. She will follow up with  me in 3 months and she is seeing Dr. Alvy Bimler 3/18.  Macklen Wilhoite A., MD 03/18/2016, 1:58 PM

## 2016-03-18 ENCOUNTER — Other Ambulatory Visit (HOSPITAL_BASED_OUTPATIENT_CLINIC_OR_DEPARTMENT_OTHER): Payer: BC Managed Care – PPO

## 2016-03-18 ENCOUNTER — Ambulatory Visit: Payer: BC Managed Care – PPO | Attending: Gynecologic Oncology | Admitting: Gynecologic Oncology

## 2016-03-18 ENCOUNTER — Other Ambulatory Visit: Payer: Self-pay | Admitting: Gynecologic Oncology

## 2016-03-18 ENCOUNTER — Telehealth: Payer: Self-pay

## 2016-03-18 ENCOUNTER — Encounter: Payer: Self-pay | Admitting: Gynecologic Oncology

## 2016-03-18 VITALS — BP 137/86 | HR 79 | Temp 98.2°F | Resp 18 | Ht 63.0 in | Wt 186.4 lb

## 2016-03-18 DIAGNOSIS — Z955 Presence of coronary angioplasty implant and graft: Secondary | ICD-10-CM | POA: Diagnosis not present

## 2016-03-18 DIAGNOSIS — Z90722 Acquired absence of ovaries, bilateral: Secondary | ICD-10-CM | POA: Diagnosis not present

## 2016-03-18 DIAGNOSIS — D649 Anemia, unspecified: Secondary | ICD-10-CM | POA: Diagnosis not present

## 2016-03-18 DIAGNOSIS — I252 Old myocardial infarction: Secondary | ICD-10-CM | POA: Insufficient documentation

## 2016-03-18 DIAGNOSIS — Z9221 Personal history of antineoplastic chemotherapy: Secondary | ICD-10-CM | POA: Diagnosis not present

## 2016-03-18 DIAGNOSIS — Z90710 Acquired absence of both cervix and uterus: Secondary | ICD-10-CM | POA: Diagnosis not present

## 2016-03-18 DIAGNOSIS — C569 Malignant neoplasm of unspecified ovary: Secondary | ICD-10-CM

## 2016-03-18 DIAGNOSIS — C561 Malignant neoplasm of right ovary: Secondary | ICD-10-CM

## 2016-03-18 DIAGNOSIS — C562 Malignant neoplasm of left ovary: Principal | ICD-10-CM

## 2016-03-18 DIAGNOSIS — Z8371 Family history of colonic polyps: Secondary | ICD-10-CM | POA: Diagnosis not present

## 2016-03-18 DIAGNOSIS — Z807 Family history of other malignant neoplasms of lymphoid, hematopoietic and related tissues: Secondary | ICD-10-CM | POA: Insufficient documentation

## 2016-03-18 DIAGNOSIS — Z803 Family history of malignant neoplasm of breast: Secondary | ICD-10-CM | POA: Diagnosis not present

## 2016-03-18 DIAGNOSIS — D61818 Other pancytopenia: Secondary | ICD-10-CM

## 2016-03-18 DIAGNOSIS — C563 Malignant neoplasm of bilateral ovaries: Secondary | ICD-10-CM

## 2016-03-18 DIAGNOSIS — Z87891 Personal history of nicotine dependence: Secondary | ICD-10-CM | POA: Insufficient documentation

## 2016-03-18 DIAGNOSIS — R918 Other nonspecific abnormal finding of lung field: Secondary | ICD-10-CM

## 2016-03-18 DIAGNOSIS — Z888 Allergy status to other drugs, medicaments and biological substances status: Secondary | ICD-10-CM | POA: Insufficient documentation

## 2016-03-18 DIAGNOSIS — Z7902 Long term (current) use of antithrombotics/antiplatelets: Secondary | ICD-10-CM | POA: Insufficient documentation

## 2016-03-18 DIAGNOSIS — Z8249 Family history of ischemic heart disease and other diseases of the circulatory system: Secondary | ICD-10-CM | POA: Diagnosis not present

## 2016-03-18 DIAGNOSIS — Z809 Family history of malignant neoplasm, unspecified: Secondary | ICD-10-CM | POA: Insufficient documentation

## 2016-03-18 DIAGNOSIS — Z7982 Long term (current) use of aspirin: Secondary | ICD-10-CM | POA: Diagnosis not present

## 2016-03-18 LAB — COMPREHENSIVE METABOLIC PANEL
ALK PHOS: 89 U/L (ref 40–150)
ALT: 13 U/L (ref 0–55)
ANION GAP: 12 meq/L — AB (ref 3–11)
AST: 14 U/L (ref 5–34)
Albumin: 4.5 g/dL (ref 3.5–5.0)
BILIRUBIN TOTAL: 0.76 mg/dL (ref 0.20–1.20)
BUN: 19 mg/dL (ref 7.0–26.0)
CALCIUM: 9.8 mg/dL (ref 8.4–10.4)
CO2: 24 mEq/L (ref 22–29)
Chloride: 106 mEq/L (ref 98–109)
Creatinine: 0.7 mg/dL (ref 0.6–1.1)
GLUCOSE: 91 mg/dL (ref 70–140)
POTASSIUM: 3.3 meq/L — AB (ref 3.5–5.1)
SODIUM: 143 meq/L (ref 136–145)
Total Protein: 7.7 g/dL (ref 6.4–8.3)

## 2016-03-18 LAB — CBC WITH DIFFERENTIAL/PLATELET
BASO%: 0.1 % (ref 0.0–2.0)
BASOS ABS: 0 10*3/uL (ref 0.0–0.1)
EOS ABS: 0.2 10*3/uL (ref 0.0–0.5)
EOS%: 2.7 % (ref 0.0–7.0)
HCT: 35.8 % (ref 34.8–46.6)
HEMOGLOBIN: 12.4 g/dL (ref 11.6–15.9)
LYMPH%: 19.1 % (ref 14.0–49.7)
MCH: 34.3 pg — AB (ref 25.1–34.0)
MCHC: 34.6 g/dL (ref 31.5–36.0)
MCV: 99.2 fL (ref 79.5–101.0)
MONO#: 0.6 10*3/uL (ref 0.1–0.9)
MONO%: 7.9 % (ref 0.0–14.0)
NEUT#: 4.9 10*3/uL (ref 1.5–6.5)
NEUT%: 70.2 % (ref 38.4–76.8)
PLATELETS: 211 10*3/uL (ref 145–400)
RBC: 3.61 10*6/uL — ABNORMAL LOW (ref 3.70–5.45)
RDW: 13.1 % (ref 11.2–14.5)
WBC: 6.9 10*3/uL (ref 3.9–10.3)
lymph#: 1.3 10*3/uL (ref 0.9–3.3)

## 2016-03-18 NOTE — Telephone Encounter (Signed)
-----   Message from Heath Lark, MD sent at 03/18/2016  3:09 PM EST ----- Regarding: labs are great Tell her labs are OK K is a bit low; recommend potassium rich diet only ----- Message ----- From: Interface, Lab In Three Zero One Sent: 03/18/2016   2:34 PM To: Heath Lark, MD

## 2016-03-18 NOTE — Telephone Encounter (Signed)
Left message regarding below note

## 2016-03-18 NOTE — Patient Instructions (Signed)
We will notify you of the results of your bloodwork from today. I will see you back on May 16. We will get a CT scan of the chest abdomen and pelvis a few days before we have those results when I see you.

## 2016-03-31 ENCOUNTER — Telehealth: Payer: Self-pay | Admitting: *Deleted

## 2016-03-31 NOTE — Telephone Encounter (Signed)
"  I called over a week ago to have lab appointment changed to after 4:00 pm.  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  I was told the appointment with Dr. Alvy Bimler would be cancelled as I will see Dr. Rogelio Seen going forward.  I need lab for Dr. Rogelio Seen that was not drawn the last time I was there."    This nurse cancelled appointment with Dr. Alvy Bimler.  Call transferred ext [phone].  [facility] scheduling has no authority of the GYN information for scheduling their appointments.

## 2016-03-31 NOTE — Telephone Encounter (Signed)
Patient moved her appts to alter in the day

## 2016-04-02 ENCOUNTER — Ambulatory Visit (HOSPITAL_BASED_OUTPATIENT_CLINIC_OR_DEPARTMENT_OTHER): Payer: BC Managed Care – PPO

## 2016-04-02 ENCOUNTER — Ambulatory Visit: Payer: BC Managed Care – PPO | Admitting: Hematology and Oncology

## 2016-04-02 ENCOUNTER — Other Ambulatory Visit: Payer: BC Managed Care – PPO

## 2016-04-02 ENCOUNTER — Other Ambulatory Visit (HOSPITAL_BASED_OUTPATIENT_CLINIC_OR_DEPARTMENT_OTHER): Payer: BC Managed Care – PPO

## 2016-04-02 ENCOUNTER — Other Ambulatory Visit: Payer: Self-pay | Admitting: *Deleted

## 2016-04-02 DIAGNOSIS — C562 Malignant neoplasm of left ovary: Principal | ICD-10-CM

## 2016-04-02 DIAGNOSIS — C569 Malignant neoplasm of unspecified ovary: Secondary | ICD-10-CM

## 2016-04-02 DIAGNOSIS — R5081 Fever presenting with conditions classified elsewhere: Secondary | ICD-10-CM

## 2016-04-02 DIAGNOSIS — C563 Malignant neoplasm of bilateral ovaries: Secondary | ICD-10-CM

## 2016-04-02 DIAGNOSIS — C561 Malignant neoplasm of right ovary: Secondary | ICD-10-CM | POA: Diagnosis not present

## 2016-04-02 DIAGNOSIS — D61818 Other pancytopenia: Secondary | ICD-10-CM

## 2016-04-02 DIAGNOSIS — Z452 Encounter for adjustment and management of vascular access device: Secondary | ICD-10-CM

## 2016-04-02 DIAGNOSIS — D709 Neutropenia, unspecified: Secondary | ICD-10-CM

## 2016-04-02 LAB — CBC WITH DIFFERENTIAL/PLATELET
BASO%: 0.2 % (ref 0.0–2.0)
Basophils Absolute: 0 10*3/uL (ref 0.0–0.1)
EOS ABS: 0.1 10*3/uL (ref 0.0–0.5)
EOS%: 1.6 % (ref 0.0–7.0)
HEMATOCRIT: 37.1 % (ref 34.8–46.6)
HGB: 12.8 g/dL (ref 11.6–15.9)
LYMPH#: 1.2 10*3/uL (ref 0.9–3.3)
LYMPH%: 18.8 % (ref 14.0–49.7)
MCH: 34.5 pg — ABNORMAL HIGH (ref 25.1–34.0)
MCHC: 34.5 g/dL (ref 31.5–36.0)
MCV: 100 fL (ref 79.5–101.0)
MONO#: 0.4 10*3/uL (ref 0.1–0.9)
MONO%: 5.9 % (ref 0.0–14.0)
NEUT%: 73.5 % (ref 38.4–76.8)
NEUTROS ABS: 4.5 10*3/uL (ref 1.5–6.5)
PLATELETS: 218 10*3/uL (ref 145–400)
RBC: 3.71 10*6/uL (ref 3.70–5.45)
RDW: 13.1 % (ref 11.2–14.5)
WBC: 6.1 10*3/uL (ref 3.9–10.3)

## 2016-04-02 LAB — COMPREHENSIVE METABOLIC PANEL
ALT: 20 U/L (ref 0–55)
ANION GAP: 13 meq/L — AB (ref 3–11)
AST: 16 U/L (ref 5–34)
Albumin: 4.4 g/dL (ref 3.5–5.0)
Alkaline Phosphatase: 82 U/L (ref 40–150)
BILIRUBIN TOTAL: 0.56 mg/dL (ref 0.20–1.20)
BUN: 18.5 mg/dL (ref 7.0–26.0)
CALCIUM: 10 mg/dL (ref 8.4–10.4)
CHLORIDE: 104 meq/L (ref 98–109)
CO2: 24 mEq/L (ref 22–29)
CREATININE: 0.7 mg/dL (ref 0.6–1.1)
EGFR: >90 mL/min/{1.73_m2} (ref >90)
Glucose: 99 mg/dl (ref 70–140)
Potassium: 4.1 mEq/L (ref 3.5–5.1)
Sodium: 142 mEq/L (ref 136–145)
TOTAL PROTEIN: 7.7 g/dL (ref 6.4–8.3)

## 2016-04-02 MED ORDER — SODIUM CHLORIDE 0.9% FLUSH
10.0000 mL | INTRAVENOUS | Status: DC | PRN
  Administered 2016-04-02: 10 mL via INTRAVENOUS
  Filled 2016-04-02: qty 10

## 2016-04-02 MED ORDER — HEPARIN SOD (PORK) LOCK FLUSH 100 UNIT/ML IV SOLN
500.0000 [IU] | Freq: Once | INTRAVENOUS | Status: AC | PRN
  Administered 2016-04-02: 500 [IU] via INTRAVENOUS
  Filled 2016-04-02: qty 5

## 2016-04-03 LAB — CA 125: Cancer Antigen (CA) 125: 7 U/mL (ref 0.0–38.1)

## 2016-04-08 ENCOUNTER — Telehealth: Payer: Self-pay

## 2016-04-08 ENCOUNTER — Telehealth: Payer: Self-pay | Admitting: *Deleted

## 2016-04-08 NOTE — Telephone Encounter (Signed)
LM in patient's vm the results of the CA-125 as noted below by Dr. Denman George.

## 2016-04-08 NOTE — Telephone Encounter (Signed)
I have scheduled a flush appt to follow the MD at on May16th. I have called and left a message for the patient

## 2016-04-08 NOTE — Telephone Encounter (Signed)
-----   Message from Everitt Amber, MD sent at 04/06/2016  9:33 AM EDT ----- Can you please let the patient know that the ca 125 is normal.

## 2016-06-01 ENCOUNTER — Ambulatory Visit (HOSPITAL_COMMUNITY): Admission: RE | Admit: 2016-06-01 | Payer: BC Managed Care – PPO | Source: Ambulatory Visit

## 2016-06-03 ENCOUNTER — Other Ambulatory Visit: Payer: Self-pay | Admitting: Gynecologic Oncology

## 2016-06-03 ENCOUNTER — Ambulatory Visit: Payer: BC Managed Care – PPO | Attending: Gynecologic Oncology | Admitting: Gynecologic Oncology

## 2016-06-03 ENCOUNTER — Encounter (HOSPITAL_COMMUNITY): Payer: Self-pay

## 2016-06-03 ENCOUNTER — Ambulatory Visit (HOSPITAL_BASED_OUTPATIENT_CLINIC_OR_DEPARTMENT_OTHER): Payer: BC Managed Care – PPO

## 2016-06-03 ENCOUNTER — Other Ambulatory Visit: Payer: Self-pay | Admitting: Hematology and Oncology

## 2016-06-03 ENCOUNTER — Ambulatory Visit (HOSPITAL_COMMUNITY)
Admission: RE | Admit: 2016-06-03 | Discharge: 2016-06-03 | Disposition: A | Discharge status: Home / Self Care | Payer: BC Managed Care – PPO | Source: Ambulatory Visit | Attending: Gynecologic Oncology | Admitting: Gynecologic Oncology

## 2016-06-03 ENCOUNTER — Encounter: Payer: Self-pay | Admitting: Gynecologic Oncology

## 2016-06-03 VITALS — BP 137/83 | HR 85 | Temp 98.6°F | Resp 20 | Wt 192.9 lb

## 2016-06-03 DIAGNOSIS — I700 Atherosclerosis of aorta: Secondary | ICD-10-CM | POA: Insufficient documentation

## 2016-06-03 DIAGNOSIS — N132 Hydronephrosis with renal and ureteral calculous obstruction: Secondary | ICD-10-CM | POA: Diagnosis not present

## 2016-06-03 DIAGNOSIS — Z8042 Family history of malignant neoplasm of prostate: Secondary | ICD-10-CM | POA: Insufficient documentation

## 2016-06-03 DIAGNOSIS — C561 Malignant neoplasm of right ovary: Secondary | ICD-10-CM

## 2016-06-03 DIAGNOSIS — Z90722 Acquired absence of ovaries, bilateral: Secondary | ICD-10-CM | POA: Diagnosis not present

## 2016-06-03 DIAGNOSIS — C569 Malignant neoplasm of unspecified ovary: Secondary | ICD-10-CM

## 2016-06-03 DIAGNOSIS — Z888 Allergy status to other drugs, medicaments and biological substances status: Secondary | ICD-10-CM | POA: Insufficient documentation

## 2016-06-03 DIAGNOSIS — K429 Umbilical hernia without obstruction or gangrene: Secondary | ICD-10-CM | POA: Diagnosis not present

## 2016-06-03 DIAGNOSIS — Z79899 Other long term (current) drug therapy: Secondary | ICD-10-CM | POA: Diagnosis not present

## 2016-06-03 DIAGNOSIS — Z90710 Acquired absence of both cervix and uterus: Secondary | ICD-10-CM | POA: Insufficient documentation

## 2016-06-03 DIAGNOSIS — I252 Old myocardial infarction: Secondary | ICD-10-CM | POA: Diagnosis not present

## 2016-06-03 DIAGNOSIS — Z8371 Family history of colonic polyps: Secondary | ICD-10-CM | POA: Diagnosis not present

## 2016-06-03 DIAGNOSIS — R911 Solitary pulmonary nodule: Secondary | ICD-10-CM | POA: Diagnosis not present

## 2016-06-03 DIAGNOSIS — Z87891 Personal history of nicotine dependence: Secondary | ICD-10-CM | POA: Insufficient documentation

## 2016-06-03 DIAGNOSIS — Z803 Family history of malignant neoplasm of breast: Secondary | ICD-10-CM | POA: Diagnosis not present

## 2016-06-03 DIAGNOSIS — Z807 Family history of other malignant neoplasms of lymphoid, hematopoietic and related tissues: Secondary | ICD-10-CM | POA: Insufficient documentation

## 2016-06-03 DIAGNOSIS — C562 Malignant neoplasm of left ovary: Principal | ICD-10-CM

## 2016-06-03 DIAGNOSIS — Z5111 Encounter for antineoplastic chemotherapy: Secondary | ICD-10-CM

## 2016-06-03 DIAGNOSIS — C563 Malignant neoplasm of bilateral ovaries: Secondary | ICD-10-CM

## 2016-06-03 DIAGNOSIS — Z8379 Family history of other diseases of the digestive system: Secondary | ICD-10-CM | POA: Diagnosis not present

## 2016-06-03 DIAGNOSIS — I2510 Atherosclerotic heart disease of native coronary artery without angina pectoris: Secondary | ICD-10-CM | POA: Diagnosis not present

## 2016-06-03 DIAGNOSIS — Z7982 Long term (current) use of aspirin: Secondary | ICD-10-CM | POA: Insufficient documentation

## 2016-06-03 DIAGNOSIS — N200 Calculus of kidney: Secondary | ICD-10-CM | POA: Insufficient documentation

## 2016-06-03 DIAGNOSIS — D709 Neutropenia, unspecified: Secondary | ICD-10-CM

## 2016-06-03 DIAGNOSIS — Z9221 Personal history of antineoplastic chemotherapy: Secondary | ICD-10-CM | POA: Diagnosis not present

## 2016-06-03 DIAGNOSIS — D61818 Other pancytopenia: Secondary | ICD-10-CM

## 2016-06-03 DIAGNOSIS — Z9889 Other specified postprocedural states: Secondary | ICD-10-CM | POA: Insufficient documentation

## 2016-06-03 DIAGNOSIS — K439 Ventral hernia without obstruction or gangrene: Secondary | ICD-10-CM | POA: Insufficient documentation

## 2016-06-03 DIAGNOSIS — Z955 Presence of coronary angioplasty implant and graft: Secondary | ICD-10-CM | POA: Insufficient documentation

## 2016-06-03 DIAGNOSIS — Z08 Encounter for follow-up examination after completed treatment for malignant neoplasm: Secondary | ICD-10-CM | POA: Diagnosis not present

## 2016-06-03 DIAGNOSIS — Z8543 Personal history of malignant neoplasm of ovary: Secondary | ICD-10-CM | POA: Insufficient documentation

## 2016-06-03 DIAGNOSIS — R5081 Fever presenting with conditions classified elsewhere: Secondary | ICD-10-CM

## 2016-06-03 LAB — CBC WITH DIFFERENTIAL/PLATELET
BASO%: 0.1 % (ref 0.0–2.0)
Basophils Absolute: 0 10*3/uL (ref 0.0–0.1)
EOS ABS: 0.2 10*3/uL (ref 0.0–0.5)
EOS%: 3.3 % (ref 0.0–7.0)
HCT: 36.3 % (ref 34.8–46.6)
HGB: 12.5 g/dL (ref 11.6–15.9)
LYMPH%: 17.8 % (ref 14.0–49.7)
MCH: 34.2 pg — ABNORMAL HIGH (ref 25.1–34.0)
MCHC: 34.4 g/dL (ref 31.5–36.0)
MCV: 99.2 fL (ref 79.5–101.0)
MONO#: 0.5 10*3/uL (ref 0.1–0.9)
MONO%: 6.5 % (ref 0.0–14.0)
NEUT%: 72.3 % (ref 38.4–76.8)
NEUTROS ABS: 5 10*3/uL (ref 1.5–6.5)
Platelets: 209 10*3/uL (ref 145–400)
RBC: 3.66 10*6/uL — AB (ref 3.70–5.45)
RDW: 13 % (ref 11.2–14.5)
WBC: 6.9 10*3/uL (ref 3.9–10.3)
lymph#: 1.2 10*3/uL (ref 0.9–3.3)

## 2016-06-03 LAB — COMPREHENSIVE METABOLIC PANEL
ALK PHOS: 81 U/L (ref 40–150)
ALT: 16 U/L (ref 0–55)
AST: 15 U/L (ref 5–34)
Albumin: 3.9 g/dL (ref 3.5–5.0)
Anion Gap: 10 mEq/L (ref 3–11)
BUN: 10.8 mg/dL (ref 7.0–26.0)
CHLORIDE: 105 meq/L (ref 98–109)
CO2: 27 meq/L (ref 22–29)
Calcium: 9 mg/dL (ref 8.4–10.4)
Creatinine: 0.7 mg/dL (ref 0.6–1.1)
GLUCOSE: 103 mg/dL (ref 70–140)
Potassium: 3.3 mEq/L — ABNORMAL LOW (ref 3.5–5.1)
SODIUM: 142 meq/L (ref 136–145)
Total Bilirubin: 0.47 mg/dL (ref 0.20–1.20)
Total Protein: 7.2 g/dL (ref 6.4–8.3)

## 2016-06-03 MED ORDER — IOPAMIDOL (ISOVUE-300) INJECTION 61%
100.0000 mL | Freq: Once | INTRAVENOUS | Status: AC | PRN
  Administered 2016-06-03: 100 mL via INTRAVENOUS

## 2016-06-03 MED ORDER — SODIUM CHLORIDE 0.9% FLUSH
10.0000 mL | INTRAVENOUS | Status: DC | PRN
  Administered 2016-06-03: 10 mL
  Filled 2016-06-03: qty 10

## 2016-06-03 MED ORDER — HEPARIN SOD (PORK) LOCK FLUSH 100 UNIT/ML IV SOLN
500.0000 [IU] | Freq: Once | INTRAVENOUS | Status: DC | PRN
  Filled 2016-06-03: qty 5

## 2016-06-03 MED ORDER — IOPAMIDOL (ISOVUE-300) INJECTION 61%
INTRAVENOUS | Status: AC
  Filled 2016-06-03: qty 100

## 2016-06-03 NOTE — Patient Instructions (Signed)
Plan to have a port flush and CA 125 today.  We will let you know the results.  Plan to have a CT scan of the abdomen and pelvis in August with an appt to meet with Dr. Alycia Rossetti after.  Please call for any questions or concerns.

## 2016-06-03 NOTE — Progress Notes (Signed)
Follow Up Note: Gyn-Onc Symptom Visit  [name] 36 y.o. female  CC:  Chief Complaint  Patient presents with  . Ovarian Cancer    HPI:  Shelley Vega is a 36 year old female initially seen in consultation at the request of Shelley Vega for evaluation of pelvic mass. She presented in May with some odor with urination, vaginal discharge and STD testing. She reports regular periods without intermenstrual bleeding that states they've become much heavier for the past couple of months. She was subsequently diagnosed with a large abdominal mass.  Patients cardiologist is Dr. Nolberto Hanlon [phone]) in [place] associated with [facility].   INTERVAL HISTORY:  Imaging:  CT A/P (06/19/14)  20cm pelvic mass arising from the right ovary- complex cystic mass complicated by internal areas of septation and large dystrophic calcifications. Favor ovarian teratoma. Trace fluid in pelvic, no peritoneal nodularity identified, no abdominal adenopathy, right pelvic lymph node measuring 1cm.  Oncology Summary:          Malignant neoplasm of right ovary (RAF-HCC)    07/13/2014  Initial Diagnosis  Malignant neoplasm of right ovary (RAF-HCC)    07/13/2014  Surgery  Exploratory laparotomy with right salpingoophorectomy, right ureterolysis, infracolic omentectomy, right pelvic and aortic lymph node dissection, and staging biopsies   Findings:  1) 20cm right adnexal mass adherent to the mesentery and right pelvic sidewall - frozen section evaluation showing at least borderline tumor with extensive calcifications  2) Retroperitoneal fibrosis requiring right ureterolysis  3) Normal abdominal survey with normal appendix, large bowel, small bowel (run from terminal ileum to ligament of Treitz), smooth diaphragms bilaterally, smooth liver edge, normal appearing omentum and stomach  4) Normal appearing left tube and ovary  5) Normal uterus      Final Diagnosis    A: Ovary, right, oophorectomy  - High grade serous  carcinoma with numerous associated psammoma bodies in a background of serous borderline tumor with micropapillary features (please see synoptic report and comment)  B: Pelvic adhesion, excision  - Involved by serous carcinoma, largest focus measures 0.7 cm  C: Omentum, omentectomy  - Involved by serous carcinoma, focus measures 0.3 cm  D: Peritoneum, pelvic nodule, excision  - No serous carcinoma identified  - Fibroadipose tissue with chronic inflammation and focal calcification  E: Lymph nodes, pelvic, right, resection  - Nine lymph nodes negative for metastatic carcinoma (0/9)  F: Peritoneum, posterior adhesion, biopsy  - No serous carcinoma identified  - Fibrovascular tissue with hemorrhage  G: Lymph node, para-aortic, excision  - Four lymph nodes negative for metastatic carcinoma (0/4)  H: Peritoneum, gutter, right, biopsy  - No serous carcinoma identified  - Fibroadipose tissue with chronic inflammation  I: Peritoneum, gutter, left, biopsy  - No serous carcinoma identified  - Fibroadipose tissue  Stage/Disposition: Shelley Vega is a 36 y.o. 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 Disposition is to carboplatin and taxol chemotherapy.  She completed all of her therapy and had a negative post treatment CT scan (12/24/14):  IMPRESSION: - No CT evidence of metastatic disease in the chest, abdomen, or pelvis. Previously described nodularity in the right adnexal region is slightly less prominent on current exam and may reflect postsurgical change.  She underwent genetic testing in [place] and is BRCA negative.  She wished to have completion surgery.  OPERATIVE REPORT  Date of Service: July 19, 2015  Findings:  1. Enlarged cystic left ovary measuring roughly 5cm  2. No nodularity noted  in the pelvis and abdomen  3. Liver edge with no evidence of disease  4. Treatment effect noted on sigmoid colon  5. Filmy adhesions noted in pelvis between small bowel, left  tube/ovary, and uterus  6. 3-4 cm umbilical hernia noted  7. Minimal adhesive disease noted anteriorly  8. Mirena IUD removed  A: Uterus, cervix, left fallopian tube and ovary, hysterectomy and left salpingo-oophorectomy  Cervix  - Squamous metaplasia and endocervical glandular dilatation  - No dysplasia or carcinoma identified  Endometrium  - Inactive endometrium with stromal decidualization, consistent with progestin effect  Myometrium  - Serosa involved by serous carcinoma  - Leiomyoma measuring 1.4 cm in greatest dimension  Left ovary  - Invasive high grade serous carcinoma with associated psammoma bodies in a background of serous borderline tumor, 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) (see comment)  - Largest invasive focus measures 0.6 cm (overall tumor is 3.9 cm)  - Surface of the ovary is involved  - Background follicular cysts, some hemorrhagic  Left fallopian tube  - No carcinoma identified  - Unremarkable fallopian tube with paratubal serous cysts  - Fallopian tube adherent to left ovary  [facility] 10[facility]15.  She received cycle 4 of carboplatin and taxol on 10/25/15.  Due to symptomatic anemia after, she received two units of PRBCs on 11/08/15.  She then received cycle 6 on 12/16/15.  CT 02/21/16: IMPRESSION: Status post hysterectomy and bilateral salpingo-oophorectomy.  Mild progressive soft tissue along the anterior abdominal wall in the midline periumbilical region, possible reflecting a small hernia containing a knuckle of small bowel, although indeterminate. Attention on follow-up is suggested to exclude a soft tissue implant in this region.  3 mm right middle lobe pulmonary nodule, likely benign. In the setting of known malignancy, consider follow-up CT chest in 6-12 months to document stability.  Interval History:   I last saw her in February. She had a CA-125 in March that was 7. She had a CT scan today that  revealed: IMPRESSION: 1. Status post total abdominal hysterectomy and bilateral salpingo-oophorectomy. No findings to suggest local recurrence of disease or metastatic disease in the chest, abdomen or pelvis. 2. Small ventral hernia containing a short segment of mid transverse colon, without evidence of bowel incarceration or obstruction at this time. This previously contained a short segment of small bowel, which is no longer evident. 3. Multiple bilateral renal calculi. 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  She is overall feeling very well. She stay she's actually had periods of time since we last saw her that she has forgotten her diagnosis. She has gone on date is really trying to enjoy a normal life and be happy. She is seeing a therapist which she believes is really helping. She is a little stress today as her mother is currently in the emergency room with a high a heart rate but somewhat low blood pressures. She is looking forward to the summer and moving into her new apartment. She really offers no complaints whatsoever.  Review of Systems  Constitutional: Feels great Cardiovascular: No chest pain or edema. No shortness of breath Pulmonary: No cough Gastrointestinal: No nausea, vomiting, or diarrhea. No bright red blood per rectum or change  in bowel movement.  Genitourinary: No frequency, urgency, or dysuria. No vaginal bleeding or discharge.  Musculoskeletal: No pain Neurologic: No numbness or change in gait. She does occasionally have some weakness in her left hand if she is holding something for a long period of time. She states occasionally happens in the right but is more common in her left. There is no neuropathy in her feet. She has to  have her port flushed today and she is wishing to know what her CA125 results are. Psychology: No depression, anxiety.  Feeling much better now that she's speaking with the therapist.  Current Meds:  Outpatient Encounter Prescriptions as of 06/03/2016  Medication Sig  . acetaminophen (TYLENOL) 500 MG tablet Take 1,000 mg by mouth every 6 (six) hours as needed for mild pain, moderate pain, fever or headache.  . albuterol (PROVENTIL HFA;VENTOLIN HFA) 108 (90 Base) MCG/ACT inhaler Inhale 2 puffs into the lungs every 6 (six) hours as needed for wheezing or shortness of breath.  . amphetamine-dextroamphetamine (ADDERALL XR) 30 MG 24 hr capsule Take 30 mg by mouth daily with breakfast. Only takes when she is at school  . amphetamine-dextroamphetamine (ADDERALL) 20 MG tablet Take 20 mg by mouth daily with lunch. Only takes when she is at school  . aspirin 81 MG chewable tablet Chew 81 mg by mouth daily with breakfast.   . atorvastatin (LIPITOR) 40 MG tablet Take 40 mg by mouth at bedtime.   Marland Kitchen azithromycin (ZITHROMAX) 250 MG tablet Take 1 tablet (250 mg total) by mouth daily. Take first 2 tablets together, then 1 every day until finished.  . b complex vitamins tablet Take 1 tablet by mouth daily.  . cetirizine (ZYRTEC) 10 MG tablet Take 10 mg by mouth daily with breakfast.   . desvenlafaxine (PRISTIQ) 100 MG 24 hr tablet Take 100 mg by mouth daily.  Marland Kitchen desvenlafaxine (PRISTIQ) 50 MG 24 hr tablet Take 50 mg by mouth daily.  Marland Kitchen ibuprofen (ADVIL,MOTRIN) 200 MG tablet Take 400 mg by mouth every 6 (six) hours as needed for fever, headache, mild pain, moderate pain or cramping.  . lidocaine-prilocaine (EMLA) cream Apply 1 application topically as needed.  Marland Kitchen losartan (COZAAR) 50 MG tablet Take 100 mg by mouth daily with breakfast.   . magnesium oxide (MAGNESIUM-OXIDE) 400 (241.3 Mg) MG tablet Take 400 mg by mouth 2 (two) times daily.  . metoprolol succinate (TOPROL-XL) 100 MG 24 hr tablet Take 100 mg by mouth  daily with breakfast. Take with or immediately following a meal.   . Multiple Vitamin (MULTIVITAMIN WITH MINERALS) TABS tablet Take 1 tablet by mouth daily with breakfast.   . nitroGLYCERIN (NITROSTAT) 0.4 MG SL tablet Place 0.4 mg under the tongue every 5 (five) minutes as needed for chest pain.  . Omega-3 Fatty Acids (FISH OIL PO) Take 1 capsule by mouth daily.  . prasugrel (EFFIENT) 10 MG TABS tablet Take 10 mg by mouth daily with breakfast.   . [DISCONTINUED] ALPRAZolam (XANAX) 1 MG tablet Take 1 mg by mouth 3 (three) times daily as needed for anxiety.  . [DISCONTINUED] venlafaxine XR (EFFEXOR-XR) 75 MG 24 hr capsule Take 75 mg by mouth.   Facility-Administered Encounter Medications as of 06/03/2016  Medication  . iopamidol (ISOVUE-300) 61 % injection  . sodium chloride flush (NS) 0.9 % injection 10 mL    Allergy:  Allergies  Allergen Reactions  . Lisinopril Cough    Social Hx:   Social History   Social History  . Marital  status: Single    Spouse name: N/A  . Number of children: N/A  . Years of education: N/A   Occupational History  . Not on file.   Social History Main Topics  . Smoking status: Former Smoker    Packs/day: 1.00    Years: 8.00    Types: Cigarettes    Quit date: 08/19/2011  . Smokeless tobacco: Never Used  . 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 Drug use: Unknown  . Sexual activity: Not on file   Other Topics Concern  . Not on file   Social History Narrative  . No narrative on file    Past Surgical Hx:  Past Surgical History:  Procedure Laterality Date  . ABDOMINAL HYSTERECTOMY    . BREAST REDUCTION SURGERY    . CORONARY ANGIOPLASTY WITH STENT PLACEMENT    . MOUTH SURGERY      Past Medical Hx:  Past Medical History:  Diagnosis Date  . Heart attack ([place])   . Ovarian cancer ([place]) 2016   right ovary    Family Hx:  Family History  Problem Relation Age of Onset  . Colon polyps Father        about 2-3  removed following colonoscopy  . Irritable bowel syndrome Brother        1 previous colonoscopy - no polyps found  . Breast cancer Paternal Aunt 51       "low level" breast cancer  . Prostate cancer Maternal Grandfather 80  . Lymphoma Maternal Grandfather 71       large B-cell lymphoma  . Heart Problems Paternal Grandmother   . Heart attack Paternal Grandfather   . Cancer Other 76       unknown type    Vitals:  Blood pressure 137/83, pulse 85, temperature 98.6 F (37 C), resp. rate 20, weight 192 lb 14.4 oz (87.5 kg), last menstrual period 08/27/2014.  Physical Exam:  General: Well developed, well nourished female In no acute distress.   Lymph node survey: No neck thyromegaly or lymphadenopathy. No supraclavicular, or inguinal adenopathy.   Cardiovascular: Regular rate and rhythm  Lungs: Clear to auscultation bilaterally. No wheezes/crackles/rhonchi noted.   Skin: No rashes or lesions present.  Abdomen: Abdomen soft, non-tender and obese. Active bowel sounds in all quadrants. No evidence of a fluid wave or abdominal masses. Well-healed vertical midline incision. No evidence of any incisional hernias but she has had laxity of at the level of the umbilicus that could either be a small developing hernia or diastasis. There is no tenderness.  Extremities: No bilateral cyanosis, edema, or clubbing.   Pelvic: External genitalia within normal limits. Bimanual dissemination. Masses or nodularity. Rectal confirms.  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 We will follow up on the results of her CA-125 from today. I am not convinced that it will be a good marker for her as it was not elevated prior to her completion hysterectomy and she had disease at that time. She will follow up with me in 3 months, she wants to have a CT scan of the abdomen and pelvis at that time. We'll.  We will order. I do not see a need for her to have a chest component as there were no nodules on her CT today.  Kellyjo Edgren A., MD 06/03/2016, 4:16 PM

## 2016-06-03 NOTE — Patient Instructions (Signed)

## 2016-06-04 ENCOUNTER — Other Ambulatory Visit: Payer: Self-pay | Admitting: Gynecologic Oncology

## 2016-06-04 ENCOUNTER — Telehealth: Payer: Self-pay | Admitting: *Deleted

## 2016-06-04 DIAGNOSIS — C569 Malignant neoplasm of unspecified ovary: Secondary | ICD-10-CM

## 2016-06-04 NOTE — Telephone Encounter (Signed)
I have called and spoke with the patient. She will come in on Monday for her CA 125.

## 2016-06-08 ENCOUNTER — Other Ambulatory Visit (HOSPITAL_BASED_OUTPATIENT_CLINIC_OR_DEPARTMENT_OTHER): Payer: BC Managed Care – PPO

## 2016-06-08 DIAGNOSIS — C569 Malignant neoplasm of unspecified ovary: Secondary | ICD-10-CM | POA: Diagnosis not present

## 2016-06-09 LAB — CA 125: CANCER ANTIGEN (CA) 125: 7.1 U/mL (ref 0.0–38.1)

## 2016-08-19 ENCOUNTER — Ambulatory Visit: Payer: BC Managed Care – PPO | Attending: Gynecologic Oncology | Admitting: Gynecologic Oncology

## 2016-08-19 ENCOUNTER — Ambulatory Visit: Payer: BC Managed Care – PPO

## 2016-08-19 ENCOUNTER — Ambulatory Visit (HOSPITAL_COMMUNITY)
Admission: RE | Admit: 2016-08-19 | Discharge: 2016-08-19 | Disposition: A | Discharge status: Home / Self Care | Payer: BC Managed Care – PPO | Source: Ambulatory Visit | Attending: Gynecologic Oncology | Admitting: Gynecologic Oncology

## 2016-08-19 ENCOUNTER — Encounter: Payer: Self-pay | Admitting: Gynecologic Oncology

## 2016-08-19 ENCOUNTER — Other Ambulatory Visit: Payer: Self-pay | Admitting: Gynecologic Oncology

## 2016-08-19 ENCOUNTER — Encounter (HOSPITAL_COMMUNITY): Payer: Self-pay

## 2016-08-19 ENCOUNTER — Other Ambulatory Visit: Payer: BC Managed Care – PPO

## 2016-08-19 VITALS — BP 148/99 | HR 98 | Temp 97.7°F | Resp 20 | Ht 63.0 in | Wt 191.6 lb

## 2016-08-19 DIAGNOSIS — E669 Obesity, unspecified: Secondary | ICD-10-CM | POA: Diagnosis not present

## 2016-08-19 DIAGNOSIS — Z8543 Personal history of malignant neoplasm of ovary: Secondary | ICD-10-CM | POA: Insufficient documentation

## 2016-08-19 DIAGNOSIS — C563 Malignant neoplasm of bilateral ovaries: Secondary | ICD-10-CM

## 2016-08-19 DIAGNOSIS — R93422 Abnormal radiologic findings on diagnostic imaging of left kidney: Secondary | ICD-10-CM | POA: Diagnosis not present

## 2016-08-19 DIAGNOSIS — C561 Malignant neoplasm of right ovary: Secondary | ICD-10-CM

## 2016-08-19 DIAGNOSIS — Z9221 Personal history of antineoplastic chemotherapy: Secondary | ICD-10-CM

## 2016-08-19 DIAGNOSIS — K439 Ventral hernia without obstruction or gangrene: Secondary | ICD-10-CM | POA: Insufficient documentation

## 2016-08-19 DIAGNOSIS — Z8371 Family history of colonic polyps: Secondary | ICD-10-CM | POA: Insufficient documentation

## 2016-08-19 DIAGNOSIS — C569 Malignant neoplasm of unspecified ovary: Secondary | ICD-10-CM

## 2016-08-19 DIAGNOSIS — D219 Benign neoplasm of connective and other soft tissue, unspecified: Secondary | ICD-10-CM | POA: Diagnosis not present

## 2016-08-19 DIAGNOSIS — N201 Calculus of ureter: Secondary | ICD-10-CM | POA: Insufficient documentation

## 2016-08-19 DIAGNOSIS — Z8249 Family history of ischemic heart disease and other diseases of the circulatory system: Secondary | ICD-10-CM | POA: Diagnosis not present

## 2016-08-19 DIAGNOSIS — I2510 Atherosclerotic heart disease of native coronary artery without angina pectoris: Secondary | ICD-10-CM | POA: Diagnosis not present

## 2016-08-19 DIAGNOSIS — Z7982 Long term (current) use of aspirin: Secondary | ICD-10-CM | POA: Diagnosis not present

## 2016-08-19 DIAGNOSIS — I700 Atherosclerosis of aorta: Secondary | ICD-10-CM | POA: Diagnosis not present

## 2016-08-19 DIAGNOSIS — Z90710 Acquired absence of both cervix and uterus: Secondary | ICD-10-CM | POA: Diagnosis not present

## 2016-08-19 DIAGNOSIS — N289 Disorder of kidney and ureter, unspecified: Secondary | ICD-10-CM | POA: Diagnosis not present

## 2016-08-19 DIAGNOSIS — Z87891 Personal history of nicotine dependence: Secondary | ICD-10-CM | POA: Insufficient documentation

## 2016-08-19 DIAGNOSIS — Z95828 Presence of other vascular implants and grafts: Secondary | ICD-10-CM

## 2016-08-19 DIAGNOSIS — Z90722 Acquired absence of ovaries, bilateral: Secondary | ICD-10-CM | POA: Insufficient documentation

## 2016-08-19 DIAGNOSIS — I252 Old myocardial infarction: Secondary | ICD-10-CM

## 2016-08-19 DIAGNOSIS — Z9079 Acquired absence of other genital organ(s): Secondary | ICD-10-CM | POA: Insufficient documentation

## 2016-08-19 DIAGNOSIS — N202 Calculus of kidney with calculus of ureter: Secondary | ICD-10-CM | POA: Insufficient documentation

## 2016-08-19 DIAGNOSIS — C562 Malignant neoplasm of left ovary: Principal | ICD-10-CM

## 2016-08-19 MED ORDER — SODIUM CHLORIDE 0.9% FLUSH
10.0000 mL | INTRAVENOUS | Status: DC | PRN
  Administered 2016-08-19: 10 mL via INTRAVENOUS
  Filled 2016-08-19: qty 10

## 2016-08-19 MED ORDER — PRASUGREL HCL 10 MG PO TABS: 10.0000 mg | ORAL_TABLET | Freq: Every day | ORAL | 3 refills | Status: DC

## 2016-08-19 MED ORDER — IOPAMIDOL (ISOVUE-300) INJECTION 61%
100.0000 mL | Freq: Once | INTRAVENOUS | Status: AC | PRN
Start: 2016-08-19 — End: 2016-08-19
  Administered 2016-08-19: 100 mL via INTRAVENOUS

## 2016-08-19 MED ORDER — IOPAMIDOL (ISOVUE-300) INJECTION 61%
INTRAVENOUS | Status: AC
  Filled 2016-08-19: qty 100

## 2016-08-19 MED ORDER — HEPARIN SOD (PORK) LOCK FLUSH 100 UNIT/ML IV SOLN
500.0000 [IU] | Freq: Once | INTRAVENOUS | Status: DC
  Filled 2016-08-19: qty 5

## 2016-08-19 MED ORDER — ATORVASTATIN CALCIUM 40 MG PO TABS: 40.0000 mg | ORAL_TABLET | Freq: Every day | ORAL | 3 refills | Status: DC

## 2016-08-19 NOTE — Progress Notes (Signed)
Follow Up Note: Gyn-Onc Symptom Visit  Shelley Vega 36 y.o. female  CC:  Chief Complaint  Patient presents with  . Ovarian Cancer    HPI:  Shelley Vega is a 36 year old female initially seen in consultation at the request of [name] for evaluation of pelvic mass. She presented in May with some odor with urination, vaginal discharge and STD testing. She reports regular periods without intermenstrual bleeding that states they've become much heavier for the past couple of months. She was subsequently diagnosed with a large abdominal mass.  Patients cardiologist is Dr. Nolberto Hanlon [phone]) in [place] associated with [place].   INTERVAL HISTORY:  Imaging:  CT A/P (06/19/14)  20cm pelvic mass arising from the right ovary- complex cystic mass complicated by internal areas of septation and large dystrophic calcifications. Favor ovarian teratoma. Trace fluid in pelvic, no peritoneal nodularity identified, no abdominal adenopathy, right pelvic lymph node measuring 1cm.  Oncology Summary:          Malignant neoplasm of right ovary (RAF-HCC)    07/13/2014  Initial Diagnosis  Malignant neoplasm of right ovary (RAF-HCC)    07/13/2014  Surgery  Exploratory laparotomy with right salpingoophorectomy, right ureterolysis, infracolic omentectomy, right pelvic and aortic lymph node dissection, and staging biopsies   Findings:  1) 20cm right adnexal mass adherent to the mesentery and right pelvic sidewall - frozen section evaluation showing at least borderline tumor with extensive calcifications  2) Retroperitoneal fibrosis requiring right ureterolysis  3) Normal abdominal survey with normal appendix, large bowel, small bowel (run from terminal ileum to ligament of Treitz), smooth diaphragms bilaterally, smooth liver edge, normal appearing omentum and stomach  4) Normal appearing left tube and ovary  5) Normal uterus      Final Diagnosis    A: Ovary, right, oophorectomy  - High grade serous  carcinoma with numerous associated psammoma bodies in a background of serous borderline tumor with micropapillary features (please see synoptic report and comment)  B: Pelvic adhesion, excision  - Involved by serous carcinoma, largest focus measures 0.7 cm  C: Omentum, omentectomy  - Involved by serous carcinoma, focus measures 0.3 cm  D: Peritoneum, pelvic nodule, excision  - No serous carcinoma identified  - Fibroadipose tissue with chronic inflammation and focal calcification  E: Lymph nodes, pelvic, right, resection  - Nine lymph nodes negative for metastatic carcinoma (0/9)  F: Peritoneum, posterior adhesion, biopsy  - No serous carcinoma identified  - Fibrovascular tissue with hemorrhage  G: Lymph node, para-aortic, excision  - Four lymph nodes negative for metastatic carcinoma (0/4)  H: Peritoneum, gutter, right, biopsy  - No serous carcinoma identified  - Fibroadipose tissue with chronic inflammation  I: Peritoneum, gutter, left, biopsy  - No serous carcinoma identified  - Fibroadipose tissue  Stage/Disposition: Shelley Vega is a 36 y.o. 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 Disposition is to carboplatin and taxol chemotherapy.  She completed all of her therapy and had a negative post treatment CT scan (12/24/14):  IMPRESSION: - No CT evidence of metastatic disease in the chest, abdomen, or pelvis. Previously described nodularity in the right adnexal region is slightly less prominent on current exam and may reflect postsurgical change.  She underwent genetic testing in [place] and is BRCA negative.  She wished to have completion surgery.  OPERATIVE REPORT  Date of Service: July 19, 2015  Findings:  1. Enlarged cystic left ovary measuring roughly 5cm  2. No nodularity noted  in the pelvis and abdomen  3. Liver edge with no evidence of disease  4. Treatment effect noted on sigmoid colon  5. Filmy adhesions noted in pelvis between small bowel, left  tube/ovary, and uterus  6. 3-4 cm umbilical hernia noted  7. Minimal adhesive disease noted anteriorly  8. Mirena IUD removed  A: Uterus, cervix, left fallopian tube and ovary, hysterectomy and left salpingo-oophorectomy  Cervix  - Squamous metaplasia and endocervical glandular dilatation  - No dysplasia or carcinoma identified  Endometrium  - Inactive endometrium with stromal decidualization, consistent with progestin effect  Myometrium  - Serosa involved by serous carcinoma  - Leiomyoma measuring 1.4 cm in greatest dimension  Left ovary  - Invasive high grade serous carcinoma with associated psammoma bodies in a background of serous borderline tumor, 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) (see comment)  - Largest invasive focus measures 0.6 cm (overall tumor is 3.9 cm)  - Surface of the ovary is involved  - Background follicular cysts, some hemorrhagic  Left fallopian tube  - No carcinoma identified  - Unremarkable fallopian tube with paratubal serous cysts  - Fallopian tube adherent to left ovary  She saw me at [facility] on 10/21/15.  She received cycle 4 of carboplatin and taxol on 10/25/15.  Due to symptomatic anemia after, she received two units of PRBCs on 11/08/15.  She then received cycle 6 on 12/16/15. Subsequent imaging has been normal  Interval History:  I last saw her in May at which time her CA-125 was 7. Imaging at that time revealed: IMPRESSION: 1. Status post total abdominal hysterectomy and bilateral salpingo-oophorectomy. No findings to suggest local recurrence of disease or metastatic disease in the chest, abdomen or pelvis. 2. Small ventral hernia containing a short segment of mid transverse colon, without evidence of bowel incarceration or obstruction at this time. This previously contained a short segment of small bowel, which is no longer evident. 3. Multiple bilateral renal calculi. 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 4. Aortic atherosclerosis, in addition to three-vessel coronary artery disease. This is age advanced atherosclerosis. Please note that although the presence of coronary artery calcium documents the presence of coronary artery disease, the severity of this disease and any potential stenosis cannot be assessed on this non-gated CT examination. Assessment for potential risk factor modification, dietary therapy or pharmacologic therapy may be warranted, if clinically indicated. 5. Additional incidental findings, as above.  She comes in today for follow-up. As her CA-125 previously was not diagnostic for her recurrent or persistent disease she has requested CT scans that each of her visits. Should a CT scan earlier today which revealed: IMPRESSION: 1. Status post total abdominal hysterectomy and bilateral salpingo-oophorectomy. No findings to suggest local recurrence of disease or metastatic disease in the abdomen or pelvis. 2. Slight migration of one of the left-sided calculi which is now in the proximal third of the ureter associated with the upper pole collecting system of left kidney. Previously noted upper pole hydronephrosis has resolved. However, in this same distribution in the upper pole of the left kidney there is a new area of subtle wedge-shaped hypoperfusion. This is of uncertain etiology and significance, but could be related to either current or recently resolved upper  pole pyelonephritis. 3. Aortic atherosclerosis. 4. Additional incidental findings, as above.  She comes in today for follow-up. She is completely asymptomatic. She is in a new relationship and is very happy. She denies any abdominal pelvic pain. She is sexually active and states that there is no  dyspareunia, postcoital bleeding, or vaginal dryness. She's been off both were atorvastatin and her effient the last 2 weeks as she is switching cardiology providers.  Review of Systems  Constitutional: Feels great Cardiovascular: No chest pain or edema. No shortness of breath Pulmonary: No cough Gastrointestinal: No nausea, vomiting, or diarrhea. No bright red blood per rectum or change in bowel movement.  Genitourinary: No frequency, urgency, or dysuria. No vaginal bleeding or discharge.  Musculoskeletal: No pain Neurologic: No numbness or change in gait. There is no neuropathy in her feet. She has to have her port flushed today and she is wishing to know what her CA125 results are. Psychology: No depression, anxiety.  "feels great, I am back".  Current Meds:  Outpatient Encounter Prescriptions as of 08/19/2016  Medication Sig  . acetaminophen (TYLENOL) 500 MG tablet Take 1,000 mg by mouth every 6 (six) hours as needed for mild pain, moderate pain, fever or headache.  . albuterol (PROVENTIL HFA;VENTOLIN HFA) 108 (90 Base) MCG/ACT inhaler Inhale 2 puffs into the lungs every 6 (six) hours as needed for wheezing or shortness of breath.  . amphetamine-dextroamphetamine (ADDERALL XR) 30 MG 24 hr capsule Take 30 mg by mouth daily with breakfast. Only takes when she is at school  . amphetamine-dextroamphetamine (ADDERALL) 20 MG tablet Take 20 mg by mouth daily with lunch. Only takes when she is at school  . aspirin 81 MG chewable tablet Chew 81 mg by mouth daily with breakfast.   . atorvastatin (LIPITOR) 40 MG tablet Take 40 mg by mouth at bedtime.   . cetirizine (ZYRTEC) 10 MG tablet Take 10 mg by mouth daily with breakfast.   . desvenlafaxine (PRISTIQ) 100 MG 24 hr tablet Take 100 mg by mouth daily.  Marland Kitchen desvenlafaxine (PRISTIQ) 50 MG 24 hr tablet Take 50 mg by mouth daily.  Marland Kitchen losartan (COZAAR) 50 MG tablet Take 100 mg by mouth daily with breakfast.   . magnesium oxide (MAGNESIUM-OXIDE) 400 (241.3  Mg) MG tablet Take 400 mg by mouth 2 (two) times daily.  . metoprolol succinate (TOPROL-XL) 100 MG 24 hr tablet Take 100 mg by mouth daily with breakfast. Take with or immediately following a meal.   . Multiple Vitamin (MULTIVITAMIN WITH MINERALS) TABS tablet Take 1 tablet by mouth daily with breakfast.   . nitroGLYCERIN (NITROSTAT) 0.4 MG SL tablet Place 0.4 mg under the tongue every 5 (five) minutes as needed for chest pain.  . Omega-3 Fatty Acids (FISH OIL PO) Take 1 capsule by mouth daily.  . prasugrel (EFFIENT) 10 MG TABS tablet Take 10 mg by mouth daily with breakfast.   . [DISCONTINUED] azithromycin (ZITHROMAX) 250 MG tablet Take 1 tablet (250 mg total) by mouth daily. Take first 2 tablets together, then 1 every day until finished.  . [DISCONTINUED] b complex vitamins tablet Take 1 tablet by mouth daily.  . [DISCONTINUED] ibuprofen (ADVIL,MOTRIN) 200 MG tablet Take 400 mg by mouth every 6 (six) hours as needed for fever, headache, mild pain, moderate pain or cramping.  . [DISCONTINUED] lidocaine-prilocaine (EMLA) cream Apply 1 application topically as needed.   Facility-Administered Encounter Medications as of 08/19/2016  Medication  . iopamidol (ISOVUE-300) 61 % injection  . sodium chloride flush (  NS) 0.9 % injection 10 mL    Allergy:  Allergies  Allergen Reactions  . Lisinopril Cough    Social Hx:   Social History   Social History  . Marital status: Single    Spouse name: N/A  . Number of children: N/A  . Years of education: N/A   Occupational History  . Not on file.   Social History Main Topics  . Smoking status: Former Smoker    Packs/day: 1.00    Years: 8.00    Types: Cigarettes    Quit date: 08/19/2011  . Smokeless tobacco: Never Used  . 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 Drug use: Unknown  . Sexual activity: Not on file   Other Topics Concern  . Not on file   Social History Narrative  . No narrative on file    Past  Surgical Hx:  Past Surgical History:  Procedure Laterality Date  . ABDOMINAL HYSTERECTOMY    . BREAST REDUCTION SURGERY    . CORONARY ANGIOPLASTY WITH STENT PLACEMENT    . MOUTH SURGERY      Past Medical Hx:  Past Medical History:  Diagnosis Date  . Heart attack ([place])   . Ovarian cancer ([place]) 2016   right ovary    Family Hx:  Family History  Problem Relation Age of Onset  . Colon polyps Father        about 2-3 removed following colonoscopy  . Irritable bowel syndrome Brother        1 previous colonoscopy - no polyps found  . Breast cancer Paternal Aunt 94       "low level" breast cancer  . Prostate cancer Maternal Grandfather 53  . Lymphoma Maternal Grandfather 71       large B-cell lymphoma  . Heart Problems Paternal Grandmother   . Heart attack Paternal Grandfather   . Cancer Other 76       unknown type    Vitals:  Blood pressure (!) 148/99, pulse 98, temperature 97.7 F (36.5 C), temperature source Oral, resp. rate 20, height 5' 3" (1.6 m), weight 191 lb 9.6 oz (86.9 kg), last menstrual period 08/27/2014.  Physical Exam:  General: Well developed, well nourished female In no acute distress.   Lymph node survey: No neck thyromegaly or lymphadenopathy. No supraclavicular, or inguinal adenopathy.   Cardiovascular: Regular rate and rhythm  Lungs: Clear to auscultation bilaterally. No wheezes/crackles/rhonchi noted.   Skin: No rashes or lesions present.  Abdomen: Abdomen soft, non-tender and obese. Active bowel sounds in all quadrants. No evidence of a fluid wave or abdominal masses. Well-healed vertical midline incision. No evidence of any incisional hernias. There is no tenderness.  Extremities: No bilateral cyanosis, edema, or clubbing.   Pelvic: External genitalia within normal limits. Bimanual exam. Masses or nodularity. Rectal confirms.  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 We will follow up on the results of her CA-125 from today. I am not convinced that it will be a good marker for her as it was not elevated prior to her completion hysterectomy and she had disease at that time. She will follow up with me in 3 months, she wants to have a CT scan of the abdomen and pelvis at that time. We'll. We will order. I do not see a need for her to have  a chest component as there were no nodules on her CT today.  We will flush her port  We discussed the CT scan findings that I would recommend that she follow up with urology.  I refilled her cardiac medications including her atorvastatin and effient with 3 month supply is been encouraged her to seek a new cardiology provider.  GEHRIG,PAOLA A., MD 08/19/2016, 1:34 PM

## 2016-08-19 NOTE — Patient Instructions (Addendum)
We will notify you of the results of your bloodwork from today. Please return to see [place] in 3 months.  We will refer you to [facility] for evaluation and management of the kidney stone seen on CT.  Please call for any questions or concerns.

## 2016-08-20 LAB — CA 125: CANCER ANTIGEN (CA) 125: 10 U/mL (ref 0.0–38.1)

## 2016-08-27 ENCOUNTER — Telehealth: Payer: Self-pay | Admitting: *Deleted

## 2016-08-27 NOTE — Telephone Encounter (Signed)
Attempted to contact the patient regarding [facility]. Left message for the patient to call the office.

## 2016-09-09 ENCOUNTER — Encounter: Payer: Self-pay | Admitting: Gynecologic Oncology

## 2016-09-09 ENCOUNTER — Other Ambulatory Visit: Payer: Self-pay | Admitting: Gynecologic Oncology

## 2016-09-09 DIAGNOSIS — I10 Essential (primary) hypertension: Secondary | ICD-10-CM

## 2016-09-09 MED ORDER — METOPROLOL SUCCINATE ER 100 MG PO TB24: 100.0000 mg | ORAL_TABLET | Freq: Every day | ORAL | 1 refills | Status: DC

## 2016-09-10 ENCOUNTER — Telehealth: Payer: Self-pay | Admitting: Gynecologic Oncology

## 2016-09-10 NOTE — Telephone Encounter (Signed)
Pam with [facility] called earlier today about patient's Effient use around upcoming procedure.  Reached out to Dr. Alycia Rossetti who stated to refer to patient's last cardiologist note.  Note found in Care Everywhere that stated her Effient did not need to be stopped for a hysterectomy she had last year.  Pam with [facility] requested note to be faxed.  Faxed to [phone]

## 2016-09-11 ENCOUNTER — Other Ambulatory Visit: Payer: Self-pay | Admitting: Urology

## 2016-09-18 ENCOUNTER — Encounter (HOSPITAL_BASED_OUTPATIENT_CLINIC_OR_DEPARTMENT_OTHER): Payer: Self-pay | Admitting: *Deleted

## 2016-09-22 ENCOUNTER — Other Ambulatory Visit: Payer: Self-pay | Admitting: Urology

## 2016-09-22 ENCOUNTER — Encounter (HOSPITAL_BASED_OUTPATIENT_CLINIC_OR_DEPARTMENT_OTHER): Payer: Self-pay | Admitting: Anesthesiology

## 2016-09-22 NOTE — Anesthesia Preprocedure Evaluation (Addendum)
Anesthesia Evaluation  Patient identified by MRN, date of birth, ID band Patient awake    Reviewed: Allergy & Precautions, NPO status , Patient's Chart, lab work & pertinent test results, reviewed documented beta blocker date and time   Airway Mallampati: III       Dental  (+) Missing, Caps, Poor Dentition   Pulmonary asthma , sleep apnea and Continuous Positive Airway Pressure Ventilation , former smoker,    Pulmonary exam normal breath sounds clear to auscultation       Cardiovascular hypertension, Pt. on medications and Pt. on home beta blockers + CAD, + Past MI, + Cardiac Stents and + Peripheral Vascular Disease  Normal cardiovascular exam Rhythm:Regular Rate:Normal  Non STEMI 05/09/2013 DES x 2 Mid Prox LAD    Neuro/Psych PSYCHIATRIC DISORDERS Anxiety Depression Chemotherapy induced peripheral neuropathy  Neuromuscular disease    GI/Hepatic negative GI ROS, Neg liver ROS,   Endo/Other  Hyperlipidemia Obesity   Renal/GU Renal diseaseLeft renal/ ureteral calculus  negative genitourinary   Musculoskeletal negative musculoskeletal ROS (+)   Abdominal (+) + obese,   Peds  Hematology negative hematology ROS (+)   Anesthesia Other Findings   Reproductive/Obstetrics Hx/o Ovarian Ca S/P Chemo                            Anesthesia Physical Anesthesia Plan  ASA: III  Anesthesia Plan: General   Post-op Pain Management:    Induction: Intravenous  PONV Risk Score and Plan: 4 or greater and Ondansetron, Dexamethasone, Midazolam, Scopolamine patch - Pre-op and Propofol infusion  Airway Management Planned: LMA  Additional Equipment:   Intra-op Plan: Utilization of Controlled Hypotension per surrgeon request  Post-operative Plan: Extubation in OR  Informed Consent: I have reviewed the patients History and Physical, chart, labs and discussed the procedure including the risks, benefits and  alternatives for the proposed anesthesia with the patient or authorized representative who has indicated his/her understanding and acceptance.   Dental advisory given  Plan Discussed with: Anesthesiologist, CRNA and Surgeon  Anesthesia Plan Comments:        Anesthesia Quick Evaluation

## 2016-09-22 NOTE — Progress Notes (Addendum)
UNABLE TO REACH PT VIA PHONE.  LEFT MESSAGE VIA PHONE TO BE NPO AFTER MN W/ EXCEPTION SIP OF WATER WITH METOPROLOL (AS PER CURRENT MED. LIST IN EPIC).  ARRIVE AT 0700.  NEEDS HX DONE IN EPIC,  ISTAT 8 AND EKG.  ASKED PT TO BRING ANY COPY OF CARDIOLOGIST NOTE, EKG, OR TESTING WITH DOS SINCE NOTHING AVAILABLE IN EPIC.  PER ONCOLOGIST NOTE , DR Alycia Rossetti, [place], [phone] ([place] [facility]). SPOKE W/ PAM , OR SCHEDULER FOR DR BELL, SHE STATED THAT PT DID NOT NEED TO STOP EFFIENT PER DR BELL.  ALSO, VERIFIED PHONE # AND WHAT WE HAVE IS CORRECT.

## 2016-09-23 ENCOUNTER — Ambulatory Visit (HOSPITAL_BASED_OUTPATIENT_CLINIC_OR_DEPARTMENT_OTHER): Payer: BC Managed Care – PPO | Admitting: Anesthesiology

## 2016-09-23 ENCOUNTER — Encounter (HOSPITAL_BASED_OUTPATIENT_CLINIC_OR_DEPARTMENT_OTHER)
Admission: RE | Disposition: A | Discharge status: Home / Self Care | Payer: Self-pay | Source: Ambulatory Visit | Attending: Urology

## 2016-09-23 ENCOUNTER — Encounter (HOSPITAL_BASED_OUTPATIENT_CLINIC_OR_DEPARTMENT_OTHER): Payer: Self-pay | Admitting: *Deleted

## 2016-09-23 ENCOUNTER — Other Ambulatory Visit: Payer: Self-pay

## 2016-09-23 ENCOUNTER — Ambulatory Visit (HOSPITAL_BASED_OUTPATIENT_CLINIC_OR_DEPARTMENT_OTHER)
Admission: RE | Admit: 2016-09-23 | Discharge: 2016-09-23 | Disposition: A | Discharge status: Home / Self Care | Payer: BC Managed Care – PPO | Source: Ambulatory Visit | Attending: Urology | Admitting: Urology

## 2016-09-23 DIAGNOSIS — N202 Calculus of kidney with calculus of ureter: Secondary | ICD-10-CM | POA: Diagnosis present

## 2016-09-23 DIAGNOSIS — Q638 Other specified congenital malformations of kidney: Secondary | ICD-10-CM | POA: Insufficient documentation

## 2016-09-23 DIAGNOSIS — Z79899 Other long term (current) drug therapy: Secondary | ICD-10-CM | POA: Diagnosis not present

## 2016-09-23 HISTORY — DX: Calculus of ureter: N20.1

## 2016-09-23 HISTORY — DX: Presence of coronary angioplasty implant and graft: Z95.5

## 2016-09-23 HISTORY — DX: Malignant neoplasm of unspecified ovary: C56.9

## 2016-09-23 HISTORY — DX: Atherosclerosis of aorta: I70.0

## 2016-09-23 HISTORY — DX: Ventral hernia without obstruction or gangrene: K43.9

## 2016-09-23 HISTORY — PX: CYSTOSCOPY WITH RETROGRADE PYELOGRAM, URETEROSCOPY AND STENT PLACEMENT: SHX5789

## 2016-09-23 HISTORY — DX: Old myocardial infarction: I25.2

## 2016-09-23 HISTORY — DX: Calculus of kidney: N20.0

## 2016-09-23 HISTORY — DX: Anxiety disorder, unspecified: F41.9

## 2016-09-23 HISTORY — PX: HOLMIUM LASER APPLICATION: SHX5852

## 2016-09-23 HISTORY — DX: Depression, unspecified: F32.A

## 2016-09-23 HISTORY — DX: Essential (primary) hypertension: I10

## 2016-09-23 HISTORY — DX: Attention-deficit hyperactivity disorder, unspecified type: F90.9

## 2016-09-23 HISTORY — DX: Major depressive disorder, single episode, unspecified: F32.9

## 2016-09-23 LAB — POCT I-STAT, CHEM 8
BUN: 21 mg/dL — ABNORMAL HIGH (ref 6–20)
CALCIUM ION: 1.16 mmol/L (ref 1.15–1.40)
CREATININE: 0.6 mg/dL (ref 0.44–1.00)
Chloride: 103 mmol/L (ref 101–111)
Glucose, Bld: 97 mg/dL (ref 65–99)
HEMATOCRIT: 36 % (ref 36.0–46.0)
Hemoglobin: 12.2 g/dL (ref 12.0–15.0)
Potassium: 3.5 mmol/L (ref 3.5–5.1)
SODIUM: 142 mmol/L (ref 135–145)
TCO2: 25 mmol/L (ref 22–32)

## 2016-09-23 SURGERY — CYSTOURETEROSCOPY, WITH RETROGRADE PYELOGRAM AND STENT INSERTION
Anesthesia: General | Laterality: Left

## 2016-09-23 MED ORDER — PHENYLEPHRINE HCL 10 MG/ML IJ SOLN
INTRAVENOUS | Status: DC | PRN
  Administered 2016-09-23: 50 ug/min via INTRAVENOUS

## 2016-09-23 MED ORDER — HYDROMORPHONE HCL-NACL 0.5-0.9 MG/ML-% IV SOSY
PREFILLED_SYRINGE | INTRAVENOUS | Status: AC
  Filled 2016-09-23: qty 1

## 2016-09-23 MED ORDER — CEFAZOLIN SODIUM-DEXTROSE 2-4 GM/100ML-% IV SOLN
2.0000 g | INTRAVENOUS | Status: AC
  Administered 2016-09-23: 2 g via INTRAVENOUS
  Filled 2016-09-23: qty 100

## 2016-09-23 MED ORDER — PROPOFOL 10 MG/ML IV BOLUS
INTRAVENOUS | Status: DC | PRN
  Administered 2016-09-23: 200 mg via INTRAVENOUS
  Administered 2016-09-23 (×2): 50 mg via INTRAVENOUS

## 2016-09-23 MED ORDER — LIDOCAINE 2% (20 MG/ML) 5 ML SYRINGE
INTRAMUSCULAR | Status: DC | PRN
  Administered 2016-09-23: 100 mg via INTRAVENOUS

## 2016-09-23 MED ORDER — DEXAMETHASONE SODIUM PHOSPHATE 10 MG/ML IJ SOLN
INTRAMUSCULAR | Status: AC
  Filled 2016-09-23: qty 1

## 2016-09-23 MED ORDER — METOCLOPRAMIDE HCL 5 MG/ML IJ SOLN
10.0000 mg | Freq: Once | INTRAMUSCULAR | Status: DC | PRN
  Filled 2016-09-23: qty 2

## 2016-09-23 MED ORDER — SCOPOLAMINE 1 MG/3DAYS TD PT72
1.0000 | MEDICATED_PATCH | TRANSDERMAL | Status: DC
  Administered 2016-09-23: 1.5 mg via TRANSDERMAL
  Filled 2016-09-23: qty 1

## 2016-09-23 MED ORDER — FENTANYL CITRATE (PF) 100 MCG/2ML IJ SOLN
INTRAMUSCULAR | Status: AC
  Filled 2016-09-23: qty 2

## 2016-09-23 MED ORDER — HYDROCODONE-ACETAMINOPHEN 7.5-325 MG PO TABS
1.0000 | ORAL_TABLET | Freq: Once | ORAL | Status: AC | PRN
  Administered 2016-09-23: 1 via ORAL
  Filled 2016-09-23: qty 1

## 2016-09-23 MED ORDER — ONDANSETRON HCL 4 MG/2ML IJ SOLN
INTRAMUSCULAR | Status: DC | PRN
  Administered 2016-09-23: 4 mg via INTRAVENOUS

## 2016-09-23 MED ORDER — PHENYLEPHRINE 40 MCG/ML (10ML) SYRINGE FOR IV PUSH (FOR BLOOD PRESSURE SUPPORT)
PREFILLED_SYRINGE | INTRAVENOUS | Status: DC | PRN
  Administered 2016-09-23: 80 ug via INTRAVENOUS
  Administered 2016-09-23 (×2): 40 ug via INTRAVENOUS
  Administered 2016-09-23 (×3): 80 ug via INTRAVENOUS

## 2016-09-23 MED ORDER — SCOPOLAMINE 1 MG/3DAYS TD PT72
MEDICATED_PATCH | TRANSDERMAL | Status: AC
  Filled 2016-09-23: qty 1

## 2016-09-23 MED ORDER — PHENYLEPHRINE HCL 10 MG/ML IJ SOLN
INTRAMUSCULAR | Status: AC
  Filled 2016-09-23: qty 1

## 2016-09-23 MED ORDER — FENTANYL CITRATE (PF) 100 MCG/2ML IJ SOLN
INTRAMUSCULAR | Status: DC | PRN
  Administered 2016-09-23: 25 ug via INTRAVENOUS
  Administered 2016-09-23: 50 ug via INTRAVENOUS
  Administered 2016-09-23: 25 ug via INTRAVENOUS

## 2016-09-23 MED ORDER — HYDROCODONE-ACETAMINOPHEN 7.5-325 MG PO TABS
ORAL_TABLET | ORAL | Status: AC
  Filled 2016-09-23: qty 1

## 2016-09-23 MED ORDER — MEPERIDINE HCL 25 MG/ML IJ SOLN
6.2500 mg | INTRAMUSCULAR | Status: DC | PRN
  Filled 2016-09-23: qty 1

## 2016-09-23 MED ORDER — MIDAZOLAM HCL 2 MG/2ML IJ SOLN
INTRAMUSCULAR | Status: AC
  Filled 2016-09-23: qty 2

## 2016-09-23 MED ORDER — HYDROMORPHONE HCL 1 MG/ML IJ SOLN
0.2500 mg | INTRAMUSCULAR | Status: DC | PRN
  Administered 2016-09-23 (×2): 0.25 mg via INTRAVENOUS
  Filled 2016-09-23: qty 0.5

## 2016-09-23 MED ORDER — ONDANSETRON HCL 4 MG/2ML IJ SOLN
INTRAMUSCULAR | Status: AC
  Filled 2016-09-23: qty 2

## 2016-09-23 MED ORDER — LACTATED RINGERS IV SOLN
INTRAVENOUS | Status: DC
  Administered 2016-09-23 (×2): via INTRAVENOUS
  Filled 2016-09-23: qty 1000

## 2016-09-23 MED ORDER — DEXAMETHASONE SODIUM PHOSPHATE 10 MG/ML IJ SOLN
INTRAMUSCULAR | Status: DC | PRN
  Administered 2016-09-23: 10 mg via INTRAVENOUS

## 2016-09-23 MED ORDER — PROPOFOL 10 MG/ML IV BOLUS
INTRAVENOUS | Status: AC
  Filled 2016-09-23: qty 20

## 2016-09-23 MED ORDER — LIDOCAINE 2% (20 MG/ML) 5 ML SYRINGE
INTRAMUSCULAR | Status: AC
  Filled 2016-09-23: qty 5

## 2016-09-23 MED ORDER — KETOROLAC TROMETHAMINE 30 MG/ML IJ SOLN
INTRAMUSCULAR | Status: AC
  Filled 2016-09-23: qty 1

## 2016-09-23 MED ORDER — PHENYLEPHRINE 40 MCG/ML (10ML) SYRINGE FOR IV PUSH (FOR BLOOD PRESSURE SUPPORT)
PREFILLED_SYRINGE | INTRAVENOUS | Status: AC
  Filled 2016-09-23: qty 10

## 2016-09-23 MED ORDER — MIDAZOLAM HCL 5 MG/5ML IJ SOLN
INTRAMUSCULAR | Status: DC | PRN
  Administered 2016-09-23: 2 mg via INTRAVENOUS

## 2016-09-23 MED ORDER — KETOROLAC TROMETHAMINE 30 MG/ML IJ SOLN
INTRAMUSCULAR | Status: DC | PRN
  Administered 2016-09-23: 30 mg via INTRAVENOUS

## 2016-09-23 MED ORDER — CEFAZOLIN SODIUM-DEXTROSE 2-4 GM/100ML-% IV SOLN
INTRAVENOUS | Status: AC
  Filled 2016-09-23: qty 100

## 2016-09-23 SURGICAL SUPPLY — 26 items
BAG DRAIN URO-CYSTO SKYTR STRL (DRAIN) ×2 IMPLANT
BAG DRN UROCATH (DRAIN) ×1
BASKET LASER NITINOL 1.9FR (BASKET) ×2 IMPLANT
BSKT STON RTRVL 120 1.9FR (BASKET) ×1
CATH INTERMIT  6FR 70CM (CATHETERS) IMPLANT
CLOTH BEACON ORANGE TIMEOUT ST (SAFETY) ×2 IMPLANT
FIBER LASER FLEXIVA 200 (UROLOGICAL SUPPLIES) ×2 IMPLANT
FIBER LASER FLEXIVA 365 (UROLOGICAL SUPPLIES) IMPLANT
FIBER LASER TRAC TIP (UROLOGICAL SUPPLIES) IMPLANT
GOWN STRL REUS W/TWL LRG LVL3 (GOWN DISPOSABLE) ×2 IMPLANT
GOWN STRL REUS W/TWL XL LVL3 (GOWN DISPOSABLE) IMPLANT
GUIDEWIRE ANG ZIPWIRE 038X150 (WIRE) ×2 IMPLANT
GUIDEWIRE STR DUAL SENSOR (WIRE) ×6 IMPLANT
INFUSOR MANOMETER BAG 3000ML (MISCELLANEOUS) ×2 IMPLANT
IV NS 1000ML (IV SOLUTION) ×2
IV NS 1000ML BAXH (IV SOLUTION) ×1 IMPLANT
IV NS IRRIG 3000ML ARTHROMATIC (IV SOLUTION) ×2 IMPLANT
KIT RM TURNOVER CYSTO AR (KITS) ×2 IMPLANT
MANIFOLD NEPTUNE II (INSTRUMENTS) ×2 IMPLANT
NS IRRIG 500ML POUR BTL (IV SOLUTION) ×2 IMPLANT
PACK CYSTO (CUSTOM PROCEDURE TRAY) ×2 IMPLANT
SHEATH ACCESS URETERAL 38CM (SHEATH) ×2 IMPLANT
STENT URET 6FRX24 CONTOUR (STENTS) ×4 IMPLANT
SYRINGE 10CC LL (SYRINGE) ×2 IMPLANT
TUBE CONNECTING 12X1/4 (SUCTIONS) IMPLANT
TUBE FEEDING 8FR 16IN STR KANG (MISCELLANEOUS) ×2 IMPLANT

## 2016-09-23 NOTE — H&P (Signed)
The following is a note that was transcribed at her listed office visit.  CC: I have kidney stones.  HPI: Shelley Vega is a 36 year-old female patient who was referred by Dr. Heath Lark, MD who is here for renal calculi.  The problem is on the left side. This is her first kidney stone. She is not currently having flank pain, back pain, groin pain, nausea, vomiting, fever or chills. She has not caught a stone in her urine strainer since her symptoms began.   She has never had surgical treatment for calculi in the past.   Patient is a 36 year old female with a history of right-sided ovarian cancer, status post salpingo-oophorectomy and hysterectomy as well as chemotherapy, which was completed in December of last year. She underwent a staging CT scan, which revealed a left-sided ureteral calculus proximal 9 mm. 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 She also has multiple smaller left renal calculi. She has some punctate right renal calculi. The patient is asymptomatic. She has never had stones before.   ALLERGIES: None   MEDICATIONS: Metoprolol Succinate 100 mg tablet, extended release 24 hr  Zyrtec  Adderall 30 mg tablet  Atorvastatin Calcium 40 mg tablet  Klonopin 0.5 mg tablet  Losartan Potassium 50 mg tablet    GU PSH: Hysterectomy   NON-GU PSH: Breast Reduction Cardiac Stent Placement - 2015   GU PMH: Ovarian Cancer, Right   NON-GU PMH: Anxiety Asthma Heart disease, unspecified Hypercholesterolemia Hypertension Myocardial Infarction   FAMILY HISTORY: Kidney Stones - Mother   SOCIAL HISTORY: Marital Status: Single Preferred Language: English; Race: White Current Smoking Status: Patient does not smoke anymore.   Tobacco Use Assessment Completed:  Used Tobacco in last 30 days?  Drinks 3 caffeinated drinks per day.   REVIEW OF SYSTEMS:    GU Review Female:   Patient reports get up at night to urinate, leakage of urine, and trouble starting your stream. Patient denies  frequent urination, hard to postpone urination, burning /pain with urination, stream starts and stops, have to strain to urinate, and being pregnant.  Gastrointestinal (Upper):   Patient denies nausea, vomiting, and indigestion/ heartburn.  Gastrointestinal (Lower):   Patient denies diarrhea and constipation.  Constitutional:   Patient denies fever, night sweats, weight loss, and fatigue.  Skin:   Patient denies skin rash/ lesion and itching.  Eyes:   Patient denies blurred vision and double vision.  Ears/ Nose/ Throat:   Patient denies sore throat and sinus problems.  Hematologic/Lymphatic:   Patient reports easy bruising. Patient denies swollen glands.  Cardiovascular:   Patient denies leg swelling and chest pains.  Respiratory:   Patient denies cough and shortness of breath.  Endocrine:   Patient denies excessive thirst.  Musculoskeletal:   Patient denies back pain and joint pain.  Neurological:   Patient denies headaches and dizziness.  Psychologic:   Patient reports anxiety. Patient denies depression.   VITAL SIGNS:      09/09/2016 02:36 PM  Weight 191 lb / 86.64 kg  Height 62 in / 157.48 cm  BP 148/116 mmHg  Heart Rate 120 /min  BMI 34.9 kg/m   MULTI-SYSTEM PHYSICAL EXAMINATION:    Constitutional: Well-nourished. No physical deformities. Normally developed. Good grooming.  Neck: Neck symmetrical, not swollen. Normal tracheal position.  Respiratory: No labored breathing, no use of accessory muscles.   Cardiovascular: Normal temperature, adequate perfusion  Skin: No paleness, no jaundice,   Neurologic / Psychiatric: Oriented to time, oriented to place, oriented  to person. No depression, no anxiety, no agitation.  Gastrointestinal: No mass, no tenderness, no rigidity, non obese abdomen.  Musculoskeletal: Normal gait and station of head and neck.    PAST DATA REVIEWED:  Source Of History:  Patient  Records Review:   Previous Doctor Records  X-Ray Review: C.T. Abdomen/Pelvis:  Reviewed Films. Reviewed Report. Discussed With Patient.    PROCEDURES:         KUB - [id number]  A single view of the abdomen is obtained.  KUB Study: Abdominal Anterior-Posterior Radiograph  Date of Service: 09/09/16  Indication: Left renal and ureteral calculi  Bony Structures: Visualized bony structures of the ribs, spine, and pelvis demonstrate no obvious large lytic or blastic lesions.  Bowel Gas: Visualized bowel gas is non-obstructive appearing.   Rt Kidney: No radiodense structures are identified in the expected location of the ureter or kidney . Lt Kidney: Multiple radiopaque areas overlying the area of the left kidney. No obvious radiopaque calculus along the projected trajectory of the ureter.  Bladder Area / Pelvis: No radiodense structures are identified in the expected location of the urinary bladder.   Multiple bilateral densities consistent with phleboliths are visualized.   IMPRESSION: 1. Left renal calculi without any obvious ureteral calculi.              Urinalysis w/Scope Dipstick Dipstick Cont'd Micro  Color: Yellow Bilirubin: Neg WBC/hpf: 10 - 20/hpf  Appearance: Cloudy Ketones: Neg RBC/hpf: 0 - 2/hpf  Specific Gravity: 1.025 Blood: Neg Bacteria: Mod (26-50/hpf)  pH: 6.0 Protein: Trace Cystals: NS (Not Seen)  Glucose: Neg Urobilinogen: 0.2 Casts: NS (Not Seen)    Nitrites: Neg Trichomonas: Not Present    Leukocyte Esterase: 1+ Mucous: Present      Epithelial Cells: 0 - 5/hpf      Yeast: NS (Not Seen)      Sperm: Not Present        Notes:   He had cardiac stents and thank you   ASSESSMENT:      ICD-10 Details  1 GU:   Renal and ureteral calculus - N20.2    PLAN:          Orders Labs Urine Culture  X-Rays: KUB         Schedule Return Visit/Planned Activity: Next Available Appointment - Schedule Surgery         Document Letter(s):  Created for Patient: Clinical Summary        Notes:   We discussed the management of urinary stones. These  options include observation, ureteroscopy, and shockwave lithotripsy. We discussed which options are relevant to these particular stones. We discussed the natural history of stones as well as the complications of untreated stones and the impact on quality of life without treatment as well as with each of the above listed treatments. We also discussed the efficacy of each treatment in its ability to clear the stone burden. With any of these management options I discussed the signs and symptoms of infection and the need for emergent treatment should these be experienced. For each option we discussed the ability of each procedure to clear the patient of their stone burden.   For observation I described the risks which include but are not limited to silent renal damage, life-threatening infection, need for emergent surgery, failure to pass stone, and pain.   For ureteroscopy I described the risks which include heart attack, stroke, pulmonary embolus, death, bleeding, infection, damage to contiguous structures, positioning injury, ureteral stricture, ureteral avulsion,  ureteral injury, need for ureteral stent, inability to perform ureteroscopy, need for an interval procedure, inability to clear stone burden, stent discomfort and pain.   For shockwave lithotripsy I described the risks which include arrhythmia, kidney contusion, kidney hemorrhage, need for transfusion, pain, inability to break up stone, inability to pass stone fragments, Steinstrasse, infection associated with obstructing stones, need for different surgical procedure, need for repeat shockwave lithotripsy, and death. She is currently taking blood thinners so she is not eligible for ESWL.   After discussing different options. She would like to proceed with left ureteroscopy, laser lithotripsy and ureteral stent placement.      Signed by Link Snuffer, III, M.D. on 09/10/16 at 8:21 AM (EDT)

## 2016-09-23 NOTE — Transfer of Care (Signed)
  Last Vitals:  Vitals:   09/23/16 0653  BP: 111/79  Pulse: 87  Resp: 17  Temp: 36.7 C    Last Pain:  Vitals:   09/23/16 0653  TempSrc: Oral      Patients Stated Pain Goal: 4 (09/23/16 8469)  Immediate Anesthesia Transfer of Care Note  Patient: [name]  Procedure(s) Performed: Procedure(s) (LRB): CYSTOSCOPY WITH LEFT  RETROGRADE PYELOGRAM, LEFT URETEROSCOPY LASER LITHOREIPSY AND STENT PLACEMENT (Left) HOLMIUM LASER APPLICATION (Left)  Patient Location: PACU  Anesthesia Type: General  Level of Consciousness: awake, alert  and oriented  Airway & Oxygen Therapy: Patient Spontanous Breathing and Patient connected to nasal cannula oxygen  Post-op Assessment: Report given to PACU RN and Post -op Vital signs reviewed and stable  Post vital signs: Reviewed and stable  Complications: No apparent anesthesia complications

## 2016-09-23 NOTE — Discharge Instructions (Addendum)
Post Anesthesia Home Care Instructions  Activity: Get plenty of rest for the remainder of the day. A responsible individual must stay with you for 24 hours following the procedure.  For the next 24 hours, DO NOT: -Drive a car -Paediatric nurse -Drink alcoholic beverages -Take any medication unless instructed by your physician -Make any legal decisions or sign important papers.  Meals: Start with liquid foods such as gelatin or soup. Progress to regular foods as tolerated. Avoid greasy, spicy, heavy foods. If nausea and/or vomiting occur, drink only clear liquids until the nausea and/or vomiting subsides. Call your physician if vomiting continues.  Special Instructions/Symptoms: Your throat may feel dry or sore from the anesthesia or the breathing tube placed in your throat during surgery. If this causes discomfort, gargle with warm salt water. The discomfort should disappear within 24 hours.  If you had a scopolamine patch placed behind your ear for the management of post- operative nausea and/or vomiting:  1. The medication in the patch is effective for 72 hours, after which it should be removed.  Wrap patch in a tissue and discard in the trash. Wash hands thoroughly with soap and water. 2. You may remove the patch earlier than 72 hours if you experience unpleasant side effects which may include dry mouth, dizziness or visual disturbances. 3. Avoid touching the patch. Wash your hands with soap and water after contact with the patch.   You had a ureteroscopy with laser lithotripsy and ureteral stent placement.  You may notice some blood in the urine.  This is okay as long as it is not severe and youre able to urinate (or if your catheter is draining if you have a Foley catheter).  Some people also experienced discomfort and a great sense of urinary urgency with a stent in place.  To help with this, make sure to take Flomax and oxybutynin as directed. You may also take pain medication as  needed for discomfort from the stent. Call or go to the emergency department should you experience severe pain or fever greater than 101.  You have Klonopin listed as an outpatient medication.please use caution when combining this with Vicodin.  It can cause respiratory depression.  You may use ibuprofen first for pain and then only use the Vicodin for severe pain refractory to the ibuprofen.   [facility] [phone] Post Ureteroscopy With or Without Stent Instructions  Definitions:  Ureter: The duct that transports urine from the kidney to the bladder. Stent:   A plastic hollow tube that is placed into the ureter, from the kidney to the                 bladder to prevent the ureter from swelling shut.  GENERAL INSTRUCTIONS:  Despite the fact that no skin incisions were used, the area around the ureter and bladder is raw and irritated. The stent is a foreign body which will further irritate the bladder wall. This irritation is manifested by increased frequency of urination, both day and night, and by an increase in the urge to urinate. In some, the urge to urinate is present almost always. Sometimes the urge is strong enough that you may not be able to stop yourself from urinating. The only real cure is to remove the stent and then give time for the bladder wall to heal which can't be done until the danger of the ureter swelling shut has passed, which varies.  You may see some blood in your urine while the  stent is in place and a few days afterwards. Do not be alarmed, even if the urine was clear for a while. Get off your feet and drink lots of fluids until clearing occurs. If you start to pass clots or don't improve, call us.  DIET: You may return to your normal diet immediately. Because of the raw surface of your bladder, alcohol, spicy foods, acid type foods and drinks with caffeine may cause irritation or frequency and should be used in moderation. To keep your urine  flowing freely and to avoid constipation, drink plenty of fluids during the day ( 8-10 glasses ). Tip: Avoid cranberry juice because it is very acidic.  ACTIVITY: Your physical activity doesn't need to be restricted. However, if you are very active, you may see some blood in your urine. We suggest that you reduce your activity under these circumstances until the bleeding has stopped.  BOWELS: It is important to keep your bowels regular during the postoperative period. Straining with bowel movements can cause bleeding. A bowel movement every other day is reasonable. Use a mild laxative if needed, such as Milk of Magnesia 2-3 tablespoons, or 2 Dulcolax tablets. Call if you continue to have problems. If you have been taking narcotics for pain, before, during or after your surgery, you may be constipated. Take a laxative if necessary.   MEDICATION: You should resume your pre-surgery medications unless told not to. In addition you will often be given an antibiotic to prevent infection. These should be taken as prescribed until the bottles are finished unless you are having an unusual reaction to one of the drugs. Do not take any nonssteroidal anti inflammatories ( Ibuprofen, advil, motrin, aleve) until after 3:30pm today  PROBLEMS YOU SHOULD REPORT TO [place]:  Fevers over 100.5 Fahrenheit.  Heavy bleeding, or clots ( See above notes about blood in urine ).  Inability to urinate.  Drug reactions ( hives, rash, nausea, vomiting, diarrhea ).  Severe burning or pain with urination that is not improving.  FOLLOW-UP: You will need a follow-up appointment to monitor your progress. Call for this appointment at the number listed above. Usually the first appointment will be about three to fourteen days after your surgery.

## 2016-09-23 NOTE — Op Note (Signed)
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  Estimated blood loss minimal  Complications none immediate  Condition following the operation stable  IV fluids: Please see anesthesia records  Drains and tubes: Left ureteral stent 6 x 24  Findings:  1. Cystourethroscopy revealed a normal urethra and no bladder lesions. 2. The patient has a completely duplicated collecting system. There were no filling defects on retrograde pyelogram. There was a left proximal ureteral calculus in the upper system. There were no renal calculi seen bilaterally however there was a Randall's plaque in the lower pole system. 3. Placement of 2 left ureteral stents  Indications for the operation: This patient was found to have a left ureteral calculus on a staging CT scan for her cancer. Given this finding she elected to undergo the above operation. There is a known duplicated system on CT scan.  Description of operation: The patient was identified, consent was obtained. The patient was taken back to the operating room and placed in the supine position. She was placed under general anesthesia and converted to dorsolithotomy position. She was prepped and draped in standard sterile fashion. Timeout was performed. A 21 French rigid cystoscope was advanced into the urethra and into the bladder and complete cystoscopy was performed. There was noted to be 2 left ureteral orifices.Each ureter was cannulated with a sensor wire and advanced to the kidney under fluoroscopic guidance. A retrograde  pyelogram was performed via a open-ended ureteral catheter with the findings noted above. I then advanced asemirigid ureteroscope into the lower pole system alongside the wire and no ureteral calculi were seen. The semirigid ureteroscope was then passed into the upper system and a proximal ureteral calculus was found. This was laser fragmented. The stones were then basket extracted. The ureter was reinspected and no ureteral calculi were seen. There was mild edema surrounding the area where the stone was impacted. A second wire was then passed into the lower pole system under fluoroscopic guidance. A 12 x 14 ureteral access sheath was advanced over the wire under fluoroscopic guidance however this met resistance and could not be passed up the ureter. Therefore a flexible ureteroscope was passed over the existing wire and this passed easily up the ureter and into the kidney. Complete pyeloscopy of the lower pole system revealed no renal calculi.there was a Randall's plaque in the lower pole.  The flexible ureteroscope was then carefully passed up the upper system where completed pyeloscopy was performed which revealed no renal calculi. The scope was withdrawn and there was no injury other than mild irritation and edema. I backloaded the wire onto the rigid cystoscope and placed a 6 x 24 double-J ureteral stent into each systemunder fluoroscopic guidance. Fluoroscopy confirmed a proximal and distal coil. Direct visualization also confirmed the distal coils within the bladder.The bladder was drained and the scope withdrawn and this concluded the operation.  Disposition: The patient will return in about one week at which point the ureteral stents will be removed. She does not need a KUB prior. She'll return in about 4-6 weeks for KUB and renal ultrasound.

## 2016-09-23 NOTE — Anesthesia Postprocedure Evaluation (Signed)
Anesthesia Post Note  Patient: HAVAH AMMON  Procedure(s) Performed: Procedure(s) (LRB): CYSTOSCOPY WITH LEFT  RETROGRADE PYELOGRAM, LEFT URETEROSCOPY LASER LITHOREIPSY AND STENT PLACEMENT (Left) HOLMIUM LASER APPLICATION (Left)     Patient location during evaluation: PACU Anesthesia Type: General Level of consciousness: awake and alert and oriented Pain management: pain level controlled Vital Signs Assessment: post-procedure vital signs reviewed and stable Respiratory status: spontaneous breathing, nonlabored ventilation and respiratory function stable Cardiovascular status: blood pressure returned to baseline and stable Postop Assessment: no signs of nausea or vomiting Anesthetic complications: no    Last Vitals:  Vitals:   09/23/16 1021 09/23/16 1025  BP:    Pulse: 80 86  Resp: 19 16  Temp:    SpO2: 99% 98%    Last Pain:  Vitals:   09/23/16 1015  TempSrc:   PainSc: 3                  Laddie Naeem A.

## 2016-09-23 NOTE — Anesthesia Procedure Notes (Signed)
Procedure Name: LMA Insertion Date/Time: 09/23/2016 8:28 AM Performed by: Bethena Roys T Pre-anesthesia Checklist: Patient identified, Emergency Drugs available, Suction available and Patient being monitored Patient Re-evaluated:Patient Re-evaluated prior to induction Oxygen Delivery Method: Circle system utilized Preoxygenation: Pre-oxygenation with 100% oxygen Induction Type: IV induction Ventilation: Mask ventilation without difficulty LMA: LMA inserted LMA Size: 4.0 Number of attempts: 1 Airway Equipment and Method: Bite block Placement Confirmation: positive ETCO2 Tube secured with: Tape Dental Injury: Teeth and Oropharynx as per pre-operative assessment

## 2016-09-24 ENCOUNTER — Encounter (HOSPITAL_BASED_OUTPATIENT_CLINIC_OR_DEPARTMENT_OTHER): Payer: Self-pay | Admitting: Urology

## 2016-10-02 ENCOUNTER — Other Ambulatory Visit: Payer: Self-pay | Admitting: General Surgery

## 2016-10-02 NOTE — Progress Notes (Signed)
Spoke with pt who complained of flank pain refractory to NSAIDS after stent removal 2 days ago. No f/c/n/v. Reassured. Gave return instructions and sent Flomax and Oxybutynin to pharmacy.

## 2016-10-14 ENCOUNTER — Ambulatory Visit (HOSPITAL_BASED_OUTPATIENT_CLINIC_OR_DEPARTMENT_OTHER): Payer: BC Managed Care – PPO

## 2016-10-14 DIAGNOSIS — C562 Malignant neoplasm of left ovary: Principal | ICD-10-CM

## 2016-10-14 DIAGNOSIS — Z452 Encounter for adjustment and management of vascular access device: Secondary | ICD-10-CM | POA: Diagnosis not present

## 2016-10-14 DIAGNOSIS — Z8543 Personal history of malignant neoplasm of ovary: Secondary | ICD-10-CM

## 2016-10-14 DIAGNOSIS — C561 Malignant neoplasm of right ovary: Secondary | ICD-10-CM

## 2016-10-14 DIAGNOSIS — D709 Neutropenia, unspecified: Secondary | ICD-10-CM

## 2016-10-14 DIAGNOSIS — C563 Malignant neoplasm of bilateral ovaries: Secondary | ICD-10-CM

## 2016-10-14 DIAGNOSIS — R5081 Fever presenting with conditions classified elsewhere: Secondary | ICD-10-CM

## 2016-10-14 MED ORDER — HEPARIN SOD (PORK) LOCK FLUSH 100 UNIT/ML IV SOLN
500.0000 [IU] | Freq: Once | INTRAVENOUS | Status: AC | PRN
Start: 2016-10-14 — End: 2016-10-14
  Administered 2016-10-14: 500 [IU] via INTRAVENOUS
  Filled 2016-10-14: qty 5

## 2016-10-14 MED ORDER — SODIUM CHLORIDE 0.9% FLUSH
10.0000 mL | INTRAVENOUS | Status: DC | PRN
  Administered 2016-10-14: 10 mL via INTRAVENOUS
  Filled 2016-10-14: qty 10

## 2016-10-22 ENCOUNTER — Telehealth: Payer: Self-pay | Admitting: *Deleted

## 2016-10-22 NOTE — Telephone Encounter (Signed)
Left message 10/21/16 and  10/22/16 for patient to call and schedule.

## 2016-10-27 ENCOUNTER — Telehealth: Payer: Self-pay | Admitting: Cardiology

## 2016-10-27 NOTE — Telephone Encounter (Signed)
Received records from [facility] for appointment on 11/02/16 with Dr Percival Spanish.  Records put with Dr Hochrein's schedule for 11/02/16. lp

## 2016-10-31 ENCOUNTER — Encounter: Payer: Self-pay | Admitting: Cardiology

## 2016-10-31 NOTE — Progress Notes (Signed)
Cardiology Office Note   Date:  11/03/2016   ID:  Shelley Vega, DOB March 15, 1980, MRN [id number]  PCP:  System, Provider Not In  Cardiologist:   Minus Breeding, MD  Referring:  Self  Chief Complaint  Patient presents with  . Coronary Artery Disease      History of Present Illness: Shelley Vega is a 36 y.o. female who presents for follow up of CAD.  She had a NSTEMI in 2015 at was managed at [facility].  I was able to review these records.  She had a 99% thrombotic occlusion of a LAD at a bifurcation and required to overlapping Premier stents with aspiration thrombectomy.  OM had minor irregularities.    The RCA had 60% mid stenosis.  She had a normal EF.  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    She reports that she does well despite all of her medical issues.  The patient denies any new symptoms such as chest discomfort, neck or arm discomfort. There has been no new shortness of breath, PND or orthopnea. There have been no reported palpitations, presyncope or syncope.  She works full time.  She does not exercise as much as I would like.     Past Medical History:  Diagnosis Date  . ADHD   . Anxiety   . Aortic atherosclerosis ([place])   . Depression   . History of non-ST elevation myocardial infarction (NSTEMI) 05/09/2013   s/p  PCI and DES x2  . Hypertension   . Left ureteral stone   . Malignant neoplasm of unspecified ovary ([place]) onologist-  dr Alycia Rossetti ([facility] [place])--- per lov note 08-19-2016  no recurrence   dx 07-13-2014  right ovarian high grade serous carcinoma, Stage IIIA2,  chemotherapy 08-13-2014 to 09-26-2014;  07-19-2015 recurrence w/ left ovarian high grade serous carcinoma, chemotherpary 08-12-2015 to 12-16-2015  . Nephrolithiasis    bilateral per dr bell note (urologist)  . S/P drug eluting coronary stent placement 05-09-2013   [facility]   DES to prox. and mid LAD  overlapping 2 stents  . Ventral  hernia     Past Surgical History:  Procedure Laterality Date  . BREAST REDUCTION SURGERY    . CORONARY ANGIOPLASTY WITH STENT PLACEMENT  05-09-2013   dr Gwynneth Albright [facility] [place] [facility] [place])   PCI and DES x2 to prox. and mid LAD, overlapping (PREMIER);  60% mRCA treated medically  . CYSTOSCOPY WITH RETROGRADE PYELOGRAM, URETEROSCOPY AND STENT PLACEMENT Left 09/23/2016   Procedure: CYSTOSCOPY WITH LEFT  RETROGRADE PYELOGRAM, LEFT URETEROSCOPY LASER LITHOREIPSY AND STENT PLACEMENT;  Surgeon: Lucas Mallow, MD;  [facility];  Service: Urology;  Laterality: Left;  . DILATION AND CURETTAGE OF UTERUS  11-09-2014  dr Alycia Rossetti [facility]   and placement IUD  . EXPLORATORY LAPAROTOMY/  RIGHT SALPINGOOPHORECTOMY/ RIGHT URETEROLYSIS/ INFRACOLIC OMENTECTOMY/ RIGHT PELVIC AND AORTIC LYMPH NODE DISSECTION'S/ STAGING BX'S  07-13-2014   dr Alycia Rossetti  [facility])  . HOLMIUM LASER APPLICATION Left 09/23/6385   Procedure: HOLMIUM LASER APPLICATION;  Surgeon: Lucas Mallow, MD;  Location: [facility];  Service: Urology;  Laterality: Left;  . MOUTH SURGERY    . ROBOTIC ASSISTED TOTAL HYSTERECTOMY  07-19-2015   dr Alycia Rossetti  [facility]   Left Salpinoophorectomy  . TRANSTHORACIC ECHOCARDIOGRAM     ef 55-60%/  mild MR and TR/  trivial PR  Current Outpatient Prescriptions  Medication Sig Dispense Refill  . acetaminophen (TYLENOL) 500 MG tablet Take 1,000 mg by mouth every 6 (six) hours as needed for mild pain, moderate pain, fever or headache.    . albuterol (PROVENTIL HFA;VENTOLIN HFA) 108 (90 Base) MCG/ACT inhaler Inhale 2 puffs into the lungs every 6 (six) hours as needed for wheezing or shortness of breath. 1 Inhaler 11  . amphetamine-dextroamphetamine (ADDERALL XR) 30 MG 24 hr capsule Take 30 mg by mouth daily with breakfast. Only takes when she is at school    . amphetamine-dextroamphetamine (ADDERALL) 20 MG tablet Take 20 mg by mouth daily with  lunch. Only takes when she is at school    . aspirin 81 MG chewable tablet Chew 81 mg by mouth daily with breakfast.     . cetirizine (ZYRTEC) 10 MG tablet Take 10 mg by mouth daily with breakfast.     . desvenlafaxine (PRISTIQ) 100 MG 24 hr tablet Take 100 mg by mouth daily.    Marland Kitchen desvenlafaxine (PRISTIQ) 50 MG 24 hr tablet Take 50 mg by mouth daily.    . Multiple Vitamin (MULTIVITAMIN WITH MINERALS) TABS tablet Take 1 tablet by mouth daily with breakfast.     . nitroGLYCERIN (NITROSTAT) 0.4 MG SL tablet Place 0.4 mg under the tongue every 5 (five) minutes as needed for chest pain.    Marland Kitchen atorvastatin (LIPITOR) 40 MG tablet Take 1 tablet (40 mg total) by mouth at bedtime. 90 tablet 3  . losartan (COZAAR) 50 MG tablet Take 2 tablets (100 mg total) by mouth daily with breakfast. 90 tablet 3  . metoprolol succinate (TOPROL-XL) 100 MG 24 hr tablet Take 1 tablet (100 mg total) by mouth daily with breakfast. Take with or immediately following a meal. 90 tablet 3  . prasugrel (EFFIENT) 10 MG TABS tablet Take 1 tablet (10 mg total) by mouth daily with breakfast. 90 tablet 3   No current facility-administered medications for this visit.    Facility-Administered Medications Ordered in Other Visits  Medication Dose Route Frequency Provider Last Rate Last Dose  . sodium chloride flush (NS) 0.9 % injection 10 mL  10 mL Intravenous PRN Nicholas Lose, MD   10 mL at 12/30/15 1022    Allergies:   Lisinopril    Social History:  The patient  reports that she quit smoking about 5 years ago. Her smoking use included Cigarettes. She has a 8.00 pack-year smoking history. She has never used smokeless tobacco. She reports that she drinks alcohol.   Family History:  The patient's family history includes Breast cancer (age of onset: 44) in her paternal aunt; Cancer (age of onset: 73) in her other; Colon polyps in her father; Heart Problems in her paternal grandmother; Heart attack in her paternal grandfather;  Hyperlipidemia in her father; Irritable bowel syndrome in her brother; Lymphoma (age of onset: 43) in her maternal grandfather; Prostate cancer (age of onset: 43) in her maternal grandfather.    ROS:  Please see the history of present illness.   Otherwise, review of systems are positive for none.   All other systems are reviewed and negative.    PHYSICAL EXAM: VS:  BP 122/82 (BP Location: Left Arm, Patient Position: Sitting, Cuff Size: Large)   Pulse 70   Ht 5\' 3"  (1.6 m)   Wt 199 lb (90.3 kg)   LMP 08/27/2014   BMI 35.25 kg/m  , BMI Body mass index is 35.25 kg/m. GENERAL:  Well appearing HEENT:  Pupils  equal round and reactive, fundi not visualized, oral mucosa unremarkable NECK:  No jugular venous distention, waveform within normal limits, carotid upstroke brisk and symmetric, no bruits, no thyromegaly LYMPHATICS:  No cervical, inguinal adenopathy LUNGS:  Clear to auscultation bilaterally BACK:  No CVA tenderness CHEST:  Unremarkable HEART:  PMI not displaced or sustained,S1 and S2 within normal limits, no S3, no S4, no clicks, no rubs, no murmurs ABD:  Flat, positive bowel sounds normal in frequency in pitch, no bruits, no rebound, no guarding, no midline pulsatile mass, no hepatomegaly, no splenomegaly EXT:  2 plus pulses throughout, no edema, no cyanosis no clubbing SKIN:  No rashes no nodules NEURO:  Cranial nerves II through XII grossly intact, motor grossly intact throughout PSYCH:  Cognitively intact, oriented to person place and time    EKG:  EKG is not ordered today. The ekg ordered 09/23/16 demonstrates sinus rhythm, rate 82, axis within normal limits, intervals within normal limits, no acute ST-T wave changes.   Recent Labs: 12/02/2015: Magnesium 1.0 06/03/2016: ALT 16; Platelets 209 09/23/2016: BUN 21; Creatinine, Ser 0.60; Hemoglobin 12.2; Potassium 3.5; Sodium 142      Wt Readings from Last 3 Encounters:  11/02/16 199 lb (90.3 kg)  09/23/16 195 lb 8 oz (88.7  kg)  08/19/16 191 lb 9.6 oz (86.9 kg)      Other studies Reviewed: Additional studies/ records that were reviewed today include: Extensive records from [place]. Review of the above records demonstrates:  Please see elsewhere in the note.     ASSESSMENT AND PLAN:  CAD:  She has no active symptoms.  We are going to work aggressively on secondary risk reduction.   No testing is needed at this time.  I think that she needs lifelong DAPT.   HTN:  The blood pressure is at target. No change in medications is indicated. We will continue with therapeutic lifestyle changes (TLC).  DYSLIPIDEMIA:  I will check a lipid profile.     Current medicines are reviewed at length with the patient today.  The patient does not have concerns regarding medicines.  The following changes have been made:  no change  Labs/ tests ordered today include:   Orders Placed This Encounter  Procedures  . Lipid panel  . Hepatic function panel     Disposition:   FU with me in six months.     Signed, Minus Breeding, MD  11/03/2016 12:48 PM    [facility]

## 2016-11-02 ENCOUNTER — Other Ambulatory Visit: Payer: Self-pay | Admitting: *Deleted

## 2016-11-02 ENCOUNTER — Ambulatory Visit (INDEPENDENT_AMBULATORY_CARE_PROVIDER_SITE_OTHER): Payer: BC Managed Care – PPO | Admitting: Cardiology

## 2016-11-02 ENCOUNTER — Encounter: Payer: Self-pay | Admitting: Cardiology

## 2016-11-02 VITALS — BP 122/82 | HR 70 | Ht 63.0 in | Wt 199.0 lb

## 2016-11-02 DIAGNOSIS — I10 Essential (primary) hypertension: Secondary | ICD-10-CM | POA: Diagnosis not present

## 2016-11-02 DIAGNOSIS — I2510 Atherosclerotic heart disease of native coronary artery without angina pectoris: Secondary | ICD-10-CM

## 2016-11-02 DIAGNOSIS — Z79899 Other long term (current) drug therapy: Secondary | ICD-10-CM | POA: Diagnosis not present

## 2016-11-02 DIAGNOSIS — E785 Hyperlipidemia, unspecified: Secondary | ICD-10-CM

## 2016-11-02 MED ORDER — ATORVASTATIN CALCIUM 40 MG PO TABS: 40.0000 mg | ORAL_TABLET | Freq: Every day | ORAL | 3 refills | Status: DC

## 2016-11-02 MED ORDER — METOPROLOL SUCCINATE ER 100 MG PO TB24: 100.0000 mg | ORAL_TABLET | Freq: Every day | ORAL | 3 refills | Status: DC

## 2016-11-02 MED ORDER — LOSARTAN POTASSIUM 50 MG PO TABS: 100.0000 mg | ORAL_TABLET | Freq: Every day | ORAL | 3 refills | Status: DC

## 2016-11-02 MED ORDER — PRASUGREL HCL 10 MG PO TABS: 10.0000 mg | ORAL_TABLET | Freq: Every day | ORAL | 3 refills | Status: DC

## 2016-11-02 NOTE — Telephone Encounter (Signed)
Rx has been sent to the pharmacy electronically. ° °

## 2016-11-02 NOTE — Patient Instructions (Addendum)
Medication Instructions:  Continue current medication  If you need a refill on your cardiac medications before your next appointment, please call your pharmacy.  Labwork: Fasting Lipids Liver HERE IN OUR OFFICE AT [facility]  Testing/Procedures: None Ordered  Follow-Up: Your physician wants you to follow-up in: 6 Months. You should receive a reminder letter in the mail two months in advance. If you do not receive a letter, please call our office [phone].   Thank you for choosing [facility] at [facility]!!

## 2016-11-03 ENCOUNTER — Encounter: Payer: Self-pay | Admitting: Cardiology

## 2016-11-23 ENCOUNTER — Ambulatory Visit (INDEPENDENT_AMBULATORY_CARE_PROVIDER_SITE_OTHER): Payer: BC Managed Care – PPO

## 2016-11-23 ENCOUNTER — Encounter (HOSPITAL_COMMUNITY): Payer: Self-pay | Admitting: Emergency Medicine

## 2016-11-23 ENCOUNTER — Ambulatory Visit (HOSPITAL_COMMUNITY)
Admission: EM | Admit: 2016-11-23 | Discharge: 2016-11-23 | Disposition: A | Discharge status: Home / Self Care | Payer: BC Managed Care – PPO | Attending: Family Medicine | Admitting: Family Medicine

## 2016-11-23 DIAGNOSIS — R69 Illness, unspecified: Secondary | ICD-10-CM

## 2016-11-23 DIAGNOSIS — R062 Wheezing: Secondary | ICD-10-CM

## 2016-11-23 DIAGNOSIS — R05 Cough: Secondary | ICD-10-CM | POA: Diagnosis not present

## 2016-11-23 DIAGNOSIS — J111 Influenza due to unidentified influenza virus with other respiratory manifestations: Secondary | ICD-10-CM

## 2016-11-23 DIAGNOSIS — R059 Cough, unspecified: Secondary | ICD-10-CM

## 2016-11-23 MED ORDER — PREDNISONE 10 MG (21) PO TBPK: ORAL_TABLET | Freq: Every day | ORAL | 0 refills | Status: DC

## 2016-11-23 MED ORDER — HYDROCODONE-HOMATROPINE 5-1.5 MG/5ML PO SYRP: 5.0000 mL | ORAL_SOLUTION | Freq: Four times a day (QID) | ORAL | 0 refills | Status: DC | PRN

## 2016-11-23 NOTE — ED Triage Notes (Signed)
Pt c/o fever cough since Wednesday.

## 2016-11-25 NOTE — ED Provider Notes (Signed)
[place]   [id number] 11/23/16 Arrival Time: 6213  ASSESSMENT & PLAN:  1. Cough   2. Wheezing   3. Influenza-like illness     Meds ordered this encounter  Medications  . HYDROcodone-homatropine (HYCODAN) 5-1.5 MG/5ML syrup    Sig: Take 5 mLs every 6 (six) hours as needed by mouth for cough.    Dispense:  90 mL    Refill:  0  . predniSONE (STERAPRED UNI-PAK 21 TAB) 10 MG (21) TBPK tablet    Sig: Take daily by mouth. Take as directed.    Dispense:  21 tablet    Refill:  0   Medication sedation precautions. OTC symptom care as needed. May f/u here as needed.  Reviewed expectations re: course of current medical issues. Questions answered. Outlined signs and symptoms indicating need for more acute intervention. Patient verbalized understanding. After Visit Summary given.   SUBJECTIVE:  Shelley Vega is a 36 y.o. female who presents with complaint of nasal congestion, post-nasal drainage, and a persistent cough. Onset abrupt approximately several days ago. Overall fatigued. SOB: none. Wheezing: questions mild and occasional. No n/v. Normal PO intake. No specific aggravating or alleviating factors reported. OTC treatment: OTC cough medication without relief. Cough affecting sleep.  ROS: As per HPI.   OBJECTIVE:  Vitals:   11/23/16 1205  BP: (!) 141/99  Pulse: (!) 111  Resp: 18  Temp: 99 F (37.2 C)  SpO2: 100%     General appearance: alert; no distress HEENT: nasal congestion; clear runny nose; throat irritation secondary to post-nasal drainage Neck: supple without LAD Lungs: mild wheezing bilaterally; no distress; active cough Skin: warm and dry Psychological: alert and cooperative; normal mood and affect  Dg Chest 2 View  Result Date: 11/23/2016 CLINICAL DATA:  Cough and wheezing starting Wednesday.  Body. EXAM: CHEST  2 VIEW COMPARISON:  12/23/2015 FINDINGS: The heart size and mediastinal contours are within normal limits. The right  costophrenic angle is excluded. Port catheter from right IJ approach is noted with tip in the distal SVC. Both lungs are clear. The visualized skeletal structures are unremarkable. IMPRESSION: No active cardiopulmonary disease. Electronically Signed   By: Ashley Royalty M.D.   On: 11/23/2016 13:27    Allergies  Allergen Reactions  . Lisinopril Cough    Past Medical History:  Diagnosis Date  . ADHD   . Anxiety   . Aortic atherosclerosis ([place])   . Depression   . History of non-ST elevation myocardial infarction (NSTEMI) 05/09/2013   s/p  PCI and DES x2  . Hypertension   . Left ureteral stone   . Malignant neoplasm of unspecified ovary ([place]) onologist-  dr Alycia Rossetti ([facility] [place])--- per lov note 08-19-2016  no recurrence   dx 07-13-2014  right ovarian high grade serous carcinoma, Stage IIIA2,  chemotherapy 08-13-2014 to 09-26-2014;  07-19-2015 recurrence w/ left ovarian high grade serous carcinoma, chemotherpary 08-12-2015 to 12-16-2015  . Nephrolithiasis    bilateral per dr bell note (urologist)  . S/P drug eluting coronary stent placement 05-09-2013   [facility]   DES to prox. and mid LAD  overlapping 2 stents  . Ventral hernia    Social History   Socioeconomic History  . Marital status: Single    Spouse name: Not on file  . Number of children: Not on file  . Years of education: Not on file  . Highest education level: Not on file  Social Needs  . Financial resource strain: Not on  file  . Food insecurity - worry: Not on file  . Food insecurity - inability: Not on file  . Transportation needs - medical: Not on file  . Transportation needs - non-medical: Not on file  Occupational History  . Not on file  Tobacco Use  . Smoking status: Former Smoker    Packs/day: 1.00    Years: 8.00    Pack years: 8.00    Types: Cigarettes    Last attempt to quit: 08/19/2011    Years since quitting: 5.2  . Smokeless tobacco: Never Used  Substance and Sexual Activity  .  Alcohol use: Yes    Alcohol/week: 0.0 oz    Comment: soc - maybe 3 drinks per month  . Drug use: Not on file  . Sexual activity: Not on file  Other Topics Concern  . Not on file  Social History Narrative   Environmental education officer.   Family History  Problem Relation Age of Onset  . Colon polyps Father        about 2-3 removed following colonoscopy  . Hyperlipidemia Father   . Irritable bowel syndrome Brother        1 previous colonoscopy - no polyps found  . Breast cancer Paternal Aunt 6       "low level" breast cancer  . Prostate cancer Maternal Grandfather 75  . Lymphoma Maternal Grandfather 71       large B-cell lymphoma  . Heart Problems Paternal Grandmother   . Heart attack Paternal Grandfather   . Cancer Other 26       unknown type           Vanessa Kick, MD 11/25/16 [phone]

## 2016-12-01 ENCOUNTER — Other Ambulatory Visit: Payer: Self-pay | Admitting: Gynecologic Oncology

## 2016-12-01 DIAGNOSIS — C569 Malignant neoplasm of unspecified ovary: Secondary | ICD-10-CM

## 2016-12-02 ENCOUNTER — Ambulatory Visit (HOSPITAL_BASED_OUTPATIENT_CLINIC_OR_DEPARTMENT_OTHER): Payer: BC Managed Care – PPO

## 2016-12-02 ENCOUNTER — Other Ambulatory Visit: Payer: BC Managed Care – PPO

## 2016-12-02 DIAGNOSIS — Z452 Encounter for adjustment and management of vascular access device: Secondary | ICD-10-CM | POA: Diagnosis not present

## 2016-12-02 DIAGNOSIS — D709 Neutropenia, unspecified: Secondary | ICD-10-CM

## 2016-12-02 DIAGNOSIS — C569 Malignant neoplasm of unspecified ovary: Secondary | ICD-10-CM | POA: Diagnosis not present

## 2016-12-02 DIAGNOSIS — C562 Malignant neoplasm of left ovary: Principal | ICD-10-CM

## 2016-12-02 DIAGNOSIS — C563 Malignant neoplasm of bilateral ovaries: Secondary | ICD-10-CM

## 2016-12-02 DIAGNOSIS — C561 Malignant neoplasm of right ovary: Secondary | ICD-10-CM

## 2016-12-02 DIAGNOSIS — R5081 Fever presenting with conditions classified elsewhere: Secondary | ICD-10-CM

## 2016-12-02 MED ORDER — SODIUM CHLORIDE 0.9% FLUSH
10.0000 mL | INTRAVENOUS | Status: DC | PRN
  Administered 2016-12-02: 10 mL
  Filled 2016-12-02: qty 10

## 2016-12-02 MED ORDER — HEPARIN SOD (PORK) LOCK FLUSH 100 UNIT/ML IV SOLN
500.0000 [IU] | Freq: Once | INTRAVENOUS | Status: AC | PRN
  Administered 2016-12-02: 500 [IU]
  Filled 2016-12-02: qty 5

## 2016-12-03 LAB — CA 125: CANCER ANTIGEN (CA) 125: 11.5 U/mL (ref 0.0–38.1)

## 2016-12-15 NOTE — Progress Notes (Signed)
Follow Up Note: Gyn-Onc Symptom Visit  Shelley Vega 36 y.o. female  CC:  Chief Complaint  Patient presents with  . Malignant neoplasm of both ovaries [facility])    HPI:  Shelley Vega is a 36 year old female initially seen in consultation at the request of [name] for evaluation of pelvic mass. She presented in May with some odor with urination, vaginal discharge and STD testing. She reports regular periods without intermenstrual bleeding that states they've become much heavier for the past couple of months. She was subsequently diagnosed with a large abdominal mass.  Patients cardiologist is Dr. Nolberto Hanlon [phone]) in [place] associated with [place].   INTERVAL HISTORY:  Imaging:  CT A/P (06/19/14)  20cm pelvic mass arising from the right ovary- complex cystic mass complicated by internal areas of septation and large dystrophic calcifications. Favor ovarian teratoma. Trace fluid in pelvic, no peritoneal nodularity identified, no abdominal adenopathy, right pelvic lymph node measuring 1cm.  Oncology Summary:          Malignant neoplasm of right ovary (RAF-HCC)    07/13/2014  Initial Diagnosis  Malignant neoplasm of right ovary (RAF-HCC)    07/13/2014  Surgery  Exploratory laparotomy with right salpingoophorectomy, right ureterolysis, infracolic omentectomy, right pelvic and aortic lymph node dissection, and staging biopsies   Findings:  1) 20cm right adnexal mass adherent to the mesentery and right pelvic sidewall - frozen section evaluation showing at least borderline tumor with extensive calcifications  2) Retroperitoneal fibrosis requiring right ureterolysis  3) Normal abdominal survey with normal appendix, large bowel, small bowel (run from terminal ileum to ligament of Treitz), smooth diaphragms bilaterally, smooth liver edge, normal appearing omentum and stomach  4) Normal appearing left tube and ovary  5) Normal uterus      Final Diagnosis    A: Ovary, right, oophorectomy   - 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 (please see synoptic report and comment)  B: Pelvic adhesion, excision  - Involved by serous carcinoma, largest focus measures 0.7 cm  C: Omentum, omentectomy  - Involved by serous carcinoma, focus measures 0.3 cm  D: Peritoneum, pelvic nodule, excision  - No serous carcinoma identified  - Fibroadipose tissue with chronic inflammation and focal calcification  E: Lymph nodes, pelvic, right, resection  - Nine lymph nodes negative for metastatic carcinoma (0/9)  F: Peritoneum, posterior adhesion, biopsy  - No serous carcinoma identified  - Fibrovascular tissue with hemorrhage  G: Lymph node, para-aortic, excision  - Four lymph nodes negative for metastatic carcinoma (0/4)  H: Peritoneum, gutter, right, biopsy  - No serous carcinoma identified  - Fibroadipose tissue with chronic inflammation  I: Peritoneum, gutter, left, biopsy  - No serous carcinoma identified  - Fibroadipose tissue  Stage/Disposition: Shelley Vega is a 36 y.o. 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 Disposition is to carboplatin and taxol chemotherapy.  She completed all of her therapy and had a negative post treatment CT scan (12/24/14):  IMPRESSION: - No CT evidence of metastatic disease in the chest, abdomen, or pelvis. Previously described nodularity in the right adnexal region is slightly less prominent on current exam and may reflect postsurgical change.  She underwent genetic testing in [place] and is BRCA negative.  She wished to have completion surgery.  OPERATIVE REPORT  Date of Service: July 19, 2015  Findings:  1. Enlarged cystic left ovary measuring roughly 5cm  2. No nodularity noted in the pelvis and abdomen  3. Liver edge with no evidence of disease  4. Treatment effect noted on sigmoid colon  5. Filmy adhesions noted in pelvis  between small bowel, left tube/ovary, and uterus  6. 3-4 cm umbilical hernia noted  7. Minimal adhesive disease noted anteriorly  8. Mirena IUD removed  A: Uterus, cervix, left fallopian tube and ovary, hysterectomy and left salpingo-oophorectomy  Cervix  - Squamous metaplasia and endocervical glandular dilatation  - No dysplasia or carcinoma identified  Endometrium  - Inactive endometrium with stromal decidualization, consistent with progestin effect  Myometrium  - Serosa involved by serous carcinoma  - Leiomyoma measuring 1.4 cm in greatest dimension  Left ovary  - Invasive high grade serous carcinoma with associated psammoma bodies in a background of serous borderline tumor, 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) (see comment)  - Largest invasive focus measures 0.6 cm (overall tumor is 3.9 cm)  - Surface of the ovary is involved  - Background follicular cysts, some hemorrhagic  Left fallopian tube  - No carcinoma identified  - Unremarkable fallopian tube with paratubal serous cysts  - Fallopian tube adherent to left ovary  She saw me at [facility] on 10/21/15.  She received cycle 4 of carboplatin and taxol on 10/25/15.  Due to symptomatic anemia after, she received two units of PRBCs on 11/08/15.  She then received cycle 6 on 12/16/15. Subsequent imaging has been normal  I last saw her in May 2018 at which time her CA-125 was 7. Imaging at that time revealed: IMPRESSION: 1. Status post total abdominal hysterectomy and bilateral salpingo-oophorectomy. No findings to suggest local recurrence of disease or metastatic disease in the chest, abdomen or pelvis. 2. Small ventral hernia containing a short segment of mid transverse colon, without evidence of bowel incarceration or obstruction at this time. This previously contained a short segment of small bowel, which is no longer evident. 3. Multiple bilateral renal calculi. 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 4. Aortic atherosclerosis, in addition to three-vessel coronary artery disease. This is age advanced atherosclerosis. Please note that although the presence of coronary artery calcium documents the presence of coronary artery disease, the severity of this disease and any potential stenosis cannot be assessed on this non-gated CT examination. Assessment for potential risk factor modification, dietary therapy or pharmacologic therapy may be warranted, if clinically indicated. 5. Additional incidental findings, as above.  I saw her 8/18:  As her CA-125 previously was not diagnostic for her recurrent or persistent disease she has requested CT scans that each of her visits. Should a CT scan earlier today which revealed: IMPRESSION: 1. Status post total abdominal hysterectomy and bilateral salpingo-oophorectomy. No findings to suggest local recurrence of disease or metastatic disease in the abdomen or pelvis. 2. Slight migration of one of the left-sided calculi which is now in the proximal third of the ureter associated with the upper pole collecting system of left kidney. Previously noted upper pole hydronephrosis has resolved. However, in this same distribution in the upper pole of the left kidney there is a new area of subtle wedge-shaped hypoperfusion. This is of uncertain etiology and significance, but could be related to either current or recently resolved  upper pole pyelonephritis. 3. Aortic atherosclerosis. 4. Additional incidental findings, as above. CA-125 at that time was normal.   Interval History: She comes in today for follow-up. Her CA-125 11/14 was 11.5. She is completely asymptomatic. She is in a new relationship and is very happy. She denies any abdominal  pelvic pain. She is sexually active and states that there is no dyspareunia, postcoital bleeding, or vaginal dryness. She's been off both were atorvastatin and her effient the last 2 weeks as she is switching cardiology providers.  CT today: IMPRESSION: 1. No evidence of metastatic disease. 2. Bilateral renal stones. 3. Paramidline umbilical hernia contains unobstructed small bowel.  She's overall doing very well and really denies any complaints. She is a new cardiologist who stated that she probably be on the same medications for the rest of her life. She does not remember his name at this time but is very happy. She and her significant other are continuing to do well and she anticipates that they'll get married at some point. She is very pleased with the results of her CT scan.  Review of Systems  Constitutional: Feels great Cardiovascular: No chest pain or edema. No shortness of breath Pulmonary: No cough Gastrointestinal: No nausea, vomiting, or diarrhea. No bright red blood per rectum or change in bowel movement.  Genitourinary: No frequency, urgency, or dysuria. No vaginal bleeding or discharge.  Musculoskeletal: No pain Neurologic: No numbness or change in gait. There is no neuropathy in her feet. She has to have her port flushed today and she is wishing to know what her CA125 results are. Psychology: No depression, anxiety.  "feels great, I am back".  Current Meds:  Outpatient Encounter Medications as of 12/16/2016  Medication Sig  . acetaminophen (TYLENOL) 500 MG tablet Take 1,000 mg by mouth every 6 (six) hours as needed for mild pain, moderate pain, fever or headache.  . albuterol (PROVENTIL HFA;VENTOLIN HFA) 108 (90 Base) MCG/ACT inhaler Inhale 2 puffs into the lungs every 6 (six) hours as needed for wheezing or shortness of breath.  . amphetamine-dextroamphetamine (ADDERALL XR) 30 MG 24 hr capsule Take 30 mg by mouth daily with breakfast. Only takes when she is at school  .  amphetamine-dextroamphetamine (ADDERALL) 20 MG tablet Take 20 mg by mouth daily with lunch. Only takes when she is at school  . aspirin 81 MG chewable tablet Chew 81 mg by mouth daily with breakfast.   . atorvastatin (LIPITOR) 40 MG tablet Take 1 tablet (40 mg total) by mouth at bedtime.  . cetirizine (ZYRTEC) 10 MG tablet Take 10 mg by mouth daily with breakfast.   . desvenlafaxine (PRISTIQ) 100 MG 24 hr tablet Take 100 mg by mouth daily.  Marland Kitchen desvenlafaxine (PRISTIQ) 50 MG 24 hr tablet Take 50 mg by mouth daily.  Marland Kitchen HYDROcodone-homatropine (HYCODAN) 5-1.5 MG/5ML syrup Take 5 mLs every 6 (six) hours as needed by mouth for cough.  . losartan (COZAAR) 50 MG tablet Take 2 tablets (100 mg total) by mouth daily with breakfast.  . metoprolol succinate (TOPROL-XL) 100 MG 24 hr tablet Take 1 tablet (100 mg total) by mouth daily with breakfast. Take with or immediately following a meal.  . Multiple Vitamin (MULTIVITAMIN WITH MINERALS) TABS tablet Take 1 tablet by mouth daily with breakfast.   . nitroGLYCERIN (NITROSTAT) 0.4 MG SL tablet Place 0.4 mg under the tongue every 5 (five) minutes as needed for chest pain.  . prasugrel (EFFIENT) 10 MG TABS tablet Take 1 tablet (10 mg total)  by mouth daily with breakfast.  . predniSONE (STERAPRED UNI-PAK 21 TAB) 10 MG (21) TBPK tablet Take daily by mouth. Take as directed.   Facility-Administered Encounter Medications as of 12/16/2016  Medication  . sodium chloride flush (NS) 0.9 % injection 10 mL    Allergy:  Allergies  Allergen Reactions  . Lisinopril Cough    Social Hx:   Social History   Socioeconomic History  . Marital status: Single    Spouse name: Not on file  . Number of children: Not on file  . Years of education: Not on file  . Highest education level: Not on file  Social Needs  . Financial resource strain: Not on file  . Food insecurity - worry: Not on file  . Food insecurity - inability: Not on file  . Transportation needs - medical: Not  on file  . Transportation needs - non-medical: Not on file  Occupational History  . Not on file  Tobacco Use  . Smoking status: Former Smoker    Packs/day: 1.00    Years: 8.00    Pack years: 8.00    Types: Cigarettes    Last attempt to quit: 08/19/2011    Years since quitting: 5.3  . Smokeless tobacco: Never Used  Substance and Sexual Activity  . Alcohol use: Yes    Alcohol/week: 0.0 oz    Comment: soc - maybe 3 drinks per month  . Drug use: Not on file  . Sexual activity: Not on file  Other Topics Concern  . Not on file  Social History Narrative   Environmental education officer.    Past Surgical Hx:  Past Surgical History:  Procedure Laterality Date  . BREAST REDUCTION SURGERY    . CORONARY ANGIOPLASTY WITH STENT PLACEMENT  05-09-2013   dr Gwynneth Albright [facility] [place])   PCI and DES x2 to prox. and mid LAD, overlapping (PREMIER);  60% mRCA treated medically  . CYSTOSCOPY WITH RETROGRADE PYELOGRAM, URETEROSCOPY AND STENT PLACEMENT Left 09/23/2016   Procedure: CYSTOSCOPY WITH LEFT  RETROGRADE PYELOGRAM, LEFT URETEROSCOPY LASER LITHOREIPSY AND STENT PLACEMENT;  Surgeon: Lucas Mallow, MD;  Location: [facility];  Service: Urology;  Laterality: Left;  . DILATION AND CURETTAGE OF UTERUS  11-09-2014  dr Alycia Rossetti [facility]   and placement IUD  . EXPLORATORY LAPAROTOMY/  RIGHT SALPINGOOPHORECTOMY/ RIGHT URETEROLYSIS/ INFRACOLIC OMENTECTOMY/ RIGHT PELVIC AND AORTIC LYMPH NODE DISSECTION'S/ STAGING BX'S  07-13-2014   dr Alycia Rossetti  [facility])  . HOLMIUM LASER APPLICATION Left 07/25/4140   Procedure: HOLMIUM LASER APPLICATION;  Surgeon: Lucas Mallow, MD;  Location: [facility];  Service: Urology;  Laterality: Left;  . MOUTH SURGERY    . ROBOTIC ASSISTED TOTAL HYSTERECTOMY  07-19-2015   dr Alycia Rossetti  [facility]   Left Salpinoophorectomy  . TRANSTHORACIC ECHOCARDIOGRAM     ef 55-60%/  mild MR and TR/  trivial PR    Past Medical Hx:   Past Medical History:  Diagnosis Date  . ADHD   . Anxiety   . Aortic atherosclerosis ([place])   . Depression   . History of non-ST elevation myocardial infarction (NSTEMI) 05/09/2013   s/p  PCI and DES x2  . Hypertension   . Left ureteral stone   . Malignant neoplasm of unspecified ovary ([place]) onologist-  dr Alycia Rossetti ([facility] [place])--- per lov note 08-19-2016  no recurrence   dx 07-13-2014  right ovarian high grade serous carcinoma, Stage IIIA2,  chemotherapy 08-13-2014 to 09-26-2014;  07-19-2015 recurrence w/ left ovarian high grade serous carcinoma, chemotherpary 08-12-2015 to 12-16-2015  . Nephrolithiasis    bilateral per dr bell note (urologist)  . S/P drug eluting coronary stent placement 05-09-2013   [facility]   DES to prox. and mid LAD  overlapping 2 stents  . Ventral hernia     Family Hx:  Family History  Problem Relation Age of Onset  . Colon polyps Father        about 2-3 removed following colonoscopy  . Hyperlipidemia Father   . Irritable bowel syndrome Brother        1 previous colonoscopy - no polyps found  . Breast cancer Paternal Aunt 15       "low level" breast cancer  . Prostate cancer Maternal Grandfather 29  . Lymphoma Maternal Grandfather 71       large B-cell lymphoma  . Heart Problems Paternal Grandmother   . Heart attack Paternal Grandfather   . Cancer Other 76       unknown type    Vitals:  Blood pressure (!) 144/95, pulse (!) 104, temperature 97.8 F (36.6 C), temperature source Oral, resp. rate 20, height 5' 3"  (1.6 m), weight 201 lb (91.2 kg), last menstrual period 08/27/2014, SpO2 99 %.  Physical Exam:  General: Well developed, well nourished female In no acute distress.   Lymph node survey: No neck thyromegaly or lymphadenopathy. No supraclavicular, or inguinal adenopathy.   Cardiovascular: Regular rate and rhythm  Lungs: Clear to auscultation bilaterally. No wheezes/crackles/rhonchi noted.   Skin: No rashes or lesions  present.  Abdomen: Abdomen soft, non-tender and obese. Active bowel sounds in all quadrants. No evidence of a fluid wave or abdominal masses. Well-healed vertical midline incision. No evidence of any incisional hernias. There is no tenderness.  Extremities: No bilateral cyanosis, edema, or clubbing.   Pelvic: External genitalia within normal limits. Bimanual exam. Masses or nodularity. Rectal confirms.  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 Her surgery was fertility sparing. About a year later she decided that she wanted completion surgery which she underwent and was found have persistent/recurrent disease. She underwent additional chemotherapy which she completed 1 year ago yesterday.  We will follow up on the results of her CA-125 from today. I am not convinced that it will be a good marker for her as it was not elevated prior to her completion hysterectomy and she had disease at that time. She will follow up with me in 3 months with a exam and port flush and CA-125. We will plan on repeating his CT scan in 6 months. She states that she's now her 1 year anniversary from her recurrence.    We will flush her port  Milena Liggett A., MD 12/16/2016, 2:01 PM

## 2016-12-16 ENCOUNTER — Encounter: Payer: Self-pay | Admitting: Gynecologic Oncology

## 2016-12-16 ENCOUNTER — Ambulatory Visit (HOSPITAL_COMMUNITY)
Admission: RE | Admit: 2016-12-16 | Discharge: 2016-12-16 | Disposition: A | Discharge status: Home / Self Care | Payer: BC Managed Care – PPO | Source: Ambulatory Visit | Attending: Gynecologic Oncology | Admitting: Gynecologic Oncology

## 2016-12-16 ENCOUNTER — Ambulatory Visit: Payer: BC Managed Care – PPO | Attending: Gynecologic Oncology | Admitting: Gynecologic Oncology

## 2016-12-16 VITALS — BP 144/95 | HR 104 | Temp 97.8°F | Resp 20 | Ht 63.0 in | Wt 201.0 lb

## 2016-12-16 DIAGNOSIS — Z79899 Other long term (current) drug therapy: Secondary | ICD-10-CM | POA: Diagnosis not present

## 2016-12-16 DIAGNOSIS — Z08 Encounter for follow-up examination after completed treatment for malignant neoplasm: Secondary | ICD-10-CM | POA: Insufficient documentation

## 2016-12-16 DIAGNOSIS — I10 Essential (primary) hypertension: Secondary | ICD-10-CM | POA: Diagnosis not present

## 2016-12-16 DIAGNOSIS — Z8379 Family history of other diseases of the digestive system: Secondary | ICD-10-CM | POA: Insufficient documentation

## 2016-12-16 DIAGNOSIS — C569 Malignant neoplasm of unspecified ovary: Secondary | ICD-10-CM | POA: Diagnosis present

## 2016-12-16 DIAGNOSIS — Z955 Presence of coronary angioplasty implant and graft: Secondary | ICD-10-CM | POA: Insufficient documentation

## 2016-12-16 DIAGNOSIS — N200 Calculus of kidney: Secondary | ICD-10-CM | POA: Insufficient documentation

## 2016-12-16 DIAGNOSIS — Z8543 Personal history of malignant neoplasm of ovary: Secondary | ICD-10-CM

## 2016-12-16 DIAGNOSIS — Z90722 Acquired absence of ovaries, bilateral: Secondary | ICD-10-CM | POA: Diagnosis not present

## 2016-12-16 DIAGNOSIS — Z87442 Personal history of urinary calculi: Secondary | ICD-10-CM | POA: Diagnosis not present

## 2016-12-16 DIAGNOSIS — Z90710 Acquired absence of both cervix and uterus: Secondary | ICD-10-CM | POA: Insufficient documentation

## 2016-12-16 DIAGNOSIS — I252 Old myocardial infarction: Secondary | ICD-10-CM | POA: Diagnosis not present

## 2016-12-16 DIAGNOSIS — Z7982 Long term (current) use of aspirin: Secondary | ICD-10-CM | POA: Insufficient documentation

## 2016-12-16 DIAGNOSIS — Z87891 Personal history of nicotine dependence: Secondary | ICD-10-CM | POA: Diagnosis not present

## 2016-12-16 DIAGNOSIS — C562 Malignant neoplasm of left ovary: Secondary | ICD-10-CM

## 2016-12-16 DIAGNOSIS — F329 Major depressive disorder, single episode, unspecified: Secondary | ICD-10-CM | POA: Insufficient documentation

## 2016-12-16 DIAGNOSIS — Z9221 Personal history of antineoplastic chemotherapy: Secondary | ICD-10-CM | POA: Insufficient documentation

## 2016-12-16 DIAGNOSIS — F419 Anxiety disorder, unspecified: Secondary | ICD-10-CM | POA: Insufficient documentation

## 2016-12-16 DIAGNOSIS — K429 Umbilical hernia without obstruction or gangrene: Secondary | ICD-10-CM | POA: Insufficient documentation

## 2016-12-16 DIAGNOSIS — Z8371 Family history of colonic polyps: Secondary | ICD-10-CM | POA: Diagnosis not present

## 2016-12-16 DIAGNOSIS — Z888 Allergy status to other drugs, medicaments and biological substances status: Secondary | ICD-10-CM | POA: Diagnosis not present

## 2016-12-16 DIAGNOSIS — C561 Malignant neoplasm of right ovary: Secondary | ICD-10-CM

## 2016-12-16 DIAGNOSIS — C563 Malignant neoplasm of bilateral ovaries: Secondary | ICD-10-CM

## 2016-12-16 MED ORDER — IOPAMIDOL (ISOVUE-300) INJECTION 61%
100.0000 mL | Freq: Once | INTRAVENOUS | Status: AC | PRN
  Administered 2016-12-16: 100 mL via INTRAVENOUS

## 2016-12-16 MED ORDER — IOPAMIDOL (ISOVUE-300) INJECTION 61%
INTRAVENOUS | Status: AC
  Administered 2016-12-16: 100 mL via INTRAVENOUS
  Filled 2016-12-16: qty 100

## 2016-12-16 NOTE — Patient Instructions (Signed)
Plan to follow up in three months or sooner if needed.  Please call us at the end of December to schedule.

## 2017-01-26 ENCOUNTER — Inpatient Hospital Stay: Payer: BC Managed Care – PPO | Attending: Gynecologic Oncology

## 2017-01-26 DIAGNOSIS — C563 Malignant neoplasm of bilateral ovaries: Secondary | ICD-10-CM

## 2017-01-26 DIAGNOSIS — C561 Malignant neoplasm of right ovary: Secondary | ICD-10-CM | POA: Diagnosis not present

## 2017-01-26 DIAGNOSIS — D709 Neutropenia, unspecified: Secondary | ICD-10-CM

## 2017-01-26 DIAGNOSIS — Z452 Encounter for adjustment and management of vascular access device: Secondary | ICD-10-CM | POA: Diagnosis not present

## 2017-01-26 DIAGNOSIS — C562 Malignant neoplasm of left ovary: Secondary | ICD-10-CM | POA: Diagnosis not present

## 2017-01-26 DIAGNOSIS — R5081 Fever presenting with conditions classified elsewhere: Secondary | ICD-10-CM

## 2017-01-26 MED ORDER — SODIUM CHLORIDE 0.9% FLUSH
10.0000 mL | INTRAVENOUS | Status: DC | PRN
  Administered 2017-01-26: 10 mL
  Filled 2017-01-26: qty 10

## 2017-01-26 MED ORDER — HEPARIN SOD (PORK) LOCK FLUSH 100 UNIT/ML IV SOLN
500.0000 [IU] | Freq: Once | INTRAVENOUS | Status: AC | PRN
  Administered 2017-01-26: 500 [IU]
  Filled 2017-01-26: qty 5

## 2017-03-15 ENCOUNTER — Other Ambulatory Visit: Payer: Self-pay | Admitting: Hematology and Oncology

## 2017-03-17 ENCOUNTER — Other Ambulatory Visit: Payer: Self-pay | Admitting: Hematology and Oncology

## 2017-03-31 ENCOUNTER — Telehealth: Payer: Self-pay | Admitting: *Deleted

## 2017-03-31 NOTE — Telephone Encounter (Signed)
Patient called and left message that she needed to schedule a flush appt. Returned patient call left message to call the office if she needed anything. Patient called scheduling and had appt scheduled.

## 2017-04-06 ENCOUNTER — Other Ambulatory Visit: Payer: BC Managed Care – PPO

## 2017-04-07 ENCOUNTER — Other Ambulatory Visit: Payer: Self-pay | Admitting: Gynecologic Oncology

## 2017-04-07 ENCOUNTER — Inpatient Hospital Stay: Payer: BC Managed Care – PPO

## 2017-04-07 ENCOUNTER — Inpatient Hospital Stay: Payer: BC Managed Care – PPO | Attending: Gynecologic Oncology

## 2017-04-07 DIAGNOSIS — C561 Malignant neoplasm of right ovary: Secondary | ICD-10-CM

## 2017-04-07 DIAGNOSIS — Z452 Encounter for adjustment and management of vascular access device: Secondary | ICD-10-CM | POA: Insufficient documentation

## 2017-04-07 DIAGNOSIS — C569 Malignant neoplasm of unspecified ovary: Secondary | ICD-10-CM

## 2017-04-07 DIAGNOSIS — R5081 Fever presenting with conditions classified elsewhere: Secondary | ICD-10-CM

## 2017-04-07 DIAGNOSIS — C563 Malignant neoplasm of bilateral ovaries: Secondary | ICD-10-CM

## 2017-04-07 DIAGNOSIS — C562 Malignant neoplasm of left ovary: Secondary | ICD-10-CM | POA: Diagnosis present

## 2017-04-07 DIAGNOSIS — D709 Neutropenia, unspecified: Secondary | ICD-10-CM

## 2017-04-07 MED ORDER — HEPARIN SOD (PORK) LOCK FLUSH 100 UNIT/ML IV SOLN
500.0000 [IU] | Freq: Once | INTRAVENOUS | Status: AC | PRN
  Administered 2017-04-07: 500 [IU] via INTRAVENOUS
  Filled 2017-04-07: qty 5

## 2017-04-07 MED ORDER — SODIUM CHLORIDE 0.9% FLUSH
10.0000 mL | INTRAVENOUS | Status: DC | PRN
  Administered 2017-04-07: 10 mL via INTRAVENOUS
  Filled 2017-04-07: qty 10

## 2017-04-07 NOTE — Progress Notes (Signed)
Ct abdomen and pelvis ordered per Dr. Alycia Rossetti to evaluate for recurrence

## 2017-04-08 LAB — CA 125: Cancer Antigen (CA) 125: 12.6 U/mL (ref 0.0–38.1)

## 2017-04-12 ENCOUNTER — Telehealth: Payer: Self-pay | Admitting: Gynecologic Oncology

## 2017-04-12 ENCOUNTER — Telehealth: Payer: Self-pay | Admitting: *Deleted

## 2017-04-12 NOTE — Telephone Encounter (Signed)
Returned call to patient she LMVM requesting phone number for Radiology.  Infoi given to pateint.

## 2017-04-12 NOTE — Telephone Encounter (Signed)
Called and gave the patient the appt for April 29th with Dr. Gerarda Fraction for CT results

## 2017-05-17 ENCOUNTER — Inpatient Hospital Stay: Payer: BC Managed Care – PPO | Attending: Gynecologic Oncology | Admitting: Obstetrics

## 2017-05-17 ENCOUNTER — Encounter (HOSPITAL_COMMUNITY): Payer: Self-pay

## 2017-05-17 ENCOUNTER — Encounter: Payer: Self-pay | Admitting: Obstetrics

## 2017-05-17 ENCOUNTER — Ambulatory Visit (HOSPITAL_COMMUNITY)
Admission: RE | Admit: 2017-05-17 | Discharge: 2017-05-17 | Disposition: A | Discharge status: Home / Self Care | Payer: BC Managed Care – PPO | Source: Ambulatory Visit | Attending: Gynecologic Oncology | Admitting: Gynecologic Oncology

## 2017-05-17 VITALS — BP 138/96 | HR 98 | Temp 98.4°F | Resp 20 | Ht 63.0 in | Wt 205.4 lb

## 2017-05-17 DIAGNOSIS — Z90710 Acquired absence of both cervix and uterus: Secondary | ICD-10-CM

## 2017-05-17 DIAGNOSIS — Z87891 Personal history of nicotine dependence: Secondary | ICD-10-CM | POA: Diagnosis not present

## 2017-05-17 DIAGNOSIS — C569 Malignant neoplasm of unspecified ovary: Secondary | ICD-10-CM | POA: Diagnosis not present

## 2017-05-17 DIAGNOSIS — R935 Abnormal findings on diagnostic imaging of other abdominal regions, including retroperitoneum: Secondary | ICD-10-CM | POA: Insufficient documentation

## 2017-05-17 DIAGNOSIS — K439 Ventral hernia without obstruction or gangrene: Secondary | ICD-10-CM | POA: Diagnosis not present

## 2017-05-17 DIAGNOSIS — Z9221 Personal history of antineoplastic chemotherapy: Secondary | ICD-10-CM | POA: Diagnosis not present

## 2017-05-17 DIAGNOSIS — C562 Malignant neoplasm of left ovary: Secondary | ICD-10-CM | POA: Insufficient documentation

## 2017-05-17 DIAGNOSIS — C561 Malignant neoplasm of right ovary: Secondary | ICD-10-CM | POA: Diagnosis not present

## 2017-05-17 DIAGNOSIS — C563 Malignant neoplasm of bilateral ovaries: Secondary | ICD-10-CM

## 2017-05-17 MED ORDER — IOHEXOL 300 MG/ML  SOLN
100.0000 mL | Freq: Once | INTRAMUSCULAR | Status: AC | PRN
  Administered 2017-05-17: 100 mL via INTRAVENOUS

## 2017-05-17 NOTE — Progress Notes (Addendum)
Progress Note: Gyn-Onc  Initially seen in consultation at the request of Ms. Vicente Serene  CC:  Chief Complaint  Patient presents with  . Ovarian cancer, bilateral [facility])    HPI: Ms. SHAKENDRA GRIFFETH  is a very nice 37 y.o.  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 Nolberto Hanlon [phone]) in [place] associated with [place].   . Interval History:  Patient is meeting me for the first time today as Dr. Alycia Rossetti has left the [place] office.  Since her last visit with Dr. Alycia Rossetti November 2018 imaging was performed and unfortunately shows a lesion on the sigmoid colon concerning for recurrent disease.  She feels well other than being anxious about the imaging report and has no bowel complaints she has a good appetite and she denies pain.  . Initial Presentation: She was referred in 2016 for evaluation of pelvic mass. She presented in May 2016 with some odor with urination, vaginal discharge and STD testing. She reported regular periods without intermenstrual bleeding but they had become much heavier for a couple of months. She was subsequently diagnosed with a large abdominal mass on CT 06/19/14 measuring 20cm. She was taken to surgery.  She underwent ExLap/RSO/Staging (right nodal dissection) 07/13/14 with Dr. Alycia Rossetti with findings as follows: 1) 20cm right adnexal mass adherent to the mesentery and right pelvic sidewall - frozen section evaluation showing at least borderline tumor with extensive calcifications  2) Retroperitoneal fibrosis requiring right ureterolysis  3) Normal abdominal survey with normal appendix, large bowel, small bowel (run from terminal ileum to ligament of Treitz), smooth diaphragms bilaterally, smooth liver edge, normal appearing omentum and stomach  4) Normal appearing left tube and ovary  5) Normal uterus  Final pathology showed :     Final Diagnosis    A: Ovary, right, oophorectomy  - High grade serous  carcinoma with numerous associated psammoma bodies in a background of serous borderline tumor with micropapillary features (please see synoptic report and comment)  B: Pelvic adhesion, excision  - Involved by serous carcinoma, largest focus measures 0.7 cm  C: Omentum, omentectomy  - Involved by serous carcinoma, focus measures 0.3 cm  D: Peritoneum, pelvic nodule, excision  - No serous carcinoma identified  - Fibroadipose tissue with chronic inflammation and focal calcification  E: Lymph nodes, pelvic, right, resection  - Nine lymph nodes negative for metastatic carcinoma (0/9)  F: Peritoneum, posterior adhesion, biopsy  - No serous carcinoma identified  - Fibrovascular tissue with hemorrhage  G: Lymph node, para-aortic, excision  - Four lymph nodes negative for metastatic carcinoma (0/4)  H: Peritoneum, gutter, right, biopsy  - No serous carcinoma identified  - Fibroadipose tissue with chronic inflammation  I: Peritoneum, gutter, left, biopsy  - No serous carcinoma identified  - Fibroadipose tissue    She completed all of her therapy and had a negative post treatment CT scan (12/24/14)  She underwent genetic testing in [place] and is BRCA negative. (21Gene Panel with GeneDx) 08/02/14  She wished to have completion surgery which was performed July 19, 2015 via robotic assistance to include hysterectomy and LSO Findings:  1. Enlarged cystic left ovary measuring roughly 5cm  2. No nodularity noted in the pelvis and abdomen  3. Liver edge with no evidence of disease  4. Treatment effect noted on sigmoid colon  5. Filmy adhesions noted in pelvis between small bowel, left tube/ovary, and uterus  6. 3-4 cm umbilical hernia noted  7. Minimal adhesive disease noted anteriorly  8. Mirena IUD removed  Final pathology showing:  A: Uterus, cervix, left fallopian tube and ovary, hysterectomy and left salpingo-oophorectomy  Cervix  - Squamous metaplasia and endocervical glandular  dilatation  - No dysplasia or carcinoma identified  Endometrium  - Inactive endometrium with stromal decidualization, consistent with progestin effect  Myometrium  - Serosa involved by serous carcinoma  - Leiomyoma measuring 1.4 cm in greatest dimension  Left ovary  - Invasive high grade serous carcinoma with associated psammoma bodies in a background of serous borderline tumor, 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) (see comment)  - Largest invasive focus measures 0.6 cm (overall tumor is 3.9 cm)  - Surface of the ovary is involved  - Background follicular cysts, some hemorrhagic  Left fallopian tube  - No carcinoma identified  - Unremarkable fallopian tube with paratubal serous cysts  - Fallopian tube adherent to left ovary  She completed a second round of chemo (Carbo/Taxol) at [facility] on completing 6 cycles on 12/16/15.  Given a normal preoperative CA125 and higher risk for recurrence she was followed with imaging.    Measurement of disease:  Recent Labs    06/08/16 1558 08/19/16 1410 12/02/16 1525 04/07/17 1144  CAN125 7.1 10.0 11.5 12.6    . Her measurement of disease is her imaging   Radiology: . 05/17/17 - CT ABDOMEN AND PELVIS WITH CONTRAST - [facility] - notable findings; Multiple left renal calculi the largest is in the inferior pole measuring 6 mm. Mild bilateral renal cortical scarring noted. Right kidney cysts are again noted. No abnormal dilatation of the small or large bowel loops. There is a ventral abdominal wall hernia which contains nonobstructed loops of small and large bowel. There is new wall thickening and enhancing nodularity along the serosal surface of the sigmoid colon, image 82/2. On the coronal images there is a measurable area of enhancing soft tissue measuring 3.0 x 2.5 cm, image 81/5. No enlarged lymph nodes within the abdomen or pelvis. Status post hysterectomy. No ascites within the abdomen or pelvis. IMPRESSION: 1. Imaging  findings within the pelvis are suspicious for tumor recurrence. There is wall thickening and enhancing nodularity along the serosal surface of the sigmoid colon, worrisome for peritoneal disease. 2. No evidence for metastatic disease within the abdomen. 3. Ventral abdominal wall hernia contains nonobstructed loops of small and large bowel.  I personally reviewed these images  Current Meds:  Outpatient Encounter Medications as of 05/17/2017  Medication Sig  . albuterol (PROVENTIL HFA;VENTOLIN HFA) 108 (90 Base) MCG/ACT inhaler Inhale 2 puffs into the lungs every 6 (six) hours as needed for wheezing or shortness of breath.  . amphetamine-dextroamphetamine (ADDERALL XR) 30 MG 24 hr capsule Take 30 mg by mouth daily with breakfast. Only takes when she is at school  . amphetamine-dextroamphetamine (ADDERALL) 20 MG tablet Take 20 mg by mouth daily with lunch. Only takes when she is at school  . aspirin 81 MG chewable tablet Chew 81 mg by mouth daily with breakfast.   . atorvastatin (LIPITOR) 40 MG tablet Take 1 tablet (40 mg total) by mouth at bedtime.  . cetirizine (ZYRTEC) 10 MG tablet Take 10 mg by mouth daily with breakfast.   . desvenlafaxine (PRISTIQ) 100 MG 24 hr tablet Take 100 mg by mouth daily.  Marland Kitchen losartan (COZAAR) 50 MG tablet Take 2 tablets (100 mg total) by mouth daily with breakfast.  . metoprolol succinate (TOPROL-XL) 100 MG 24 hr tablet Take 1  tablet (100 mg total) by mouth daily with breakfast. Take with or immediately following a meal.  . Multiple Vitamin (MULTIVITAMIN WITH MINERALS) TABS tablet Take 1 tablet by mouth daily with breakfast.   . nitroGLYCERIN (NITROSTAT) 0.4 MG SL tablet Place 0.4 mg under the tongue every 5 (five) minutes as needed for chest pain.  . prasugrel (EFFIENT) 10 MG TABS tablet Take 1 tablet (10 mg total) by mouth daily with breakfast.  . [DISCONTINUED] acetaminophen (TYLENOL) 500 MG tablet Take 1,000 mg by mouth every 6 (six) hours as needed for mild pain,  moderate pain, fever or headache.  . [DISCONTINUED] desvenlafaxine (PRISTIQ) 50 MG 24 hr tablet Take 50 mg by mouth daily.  . [DISCONTINUED] HYDROcodone-homatropine (HYCODAN) 5-1.5 MG/5ML syrup Take 5 mLs every 6 (six) hours as needed by mouth for cough. (Patient not taking: Reported on 05/17/2017)  . [DISCONTINUED] predniSONE (STERAPRED UNI-PAK 21 TAB) 10 MG (21) TBPK tablet Take daily by mouth. Take as directed. (Patient not taking: Reported on 05/17/2017)   Facility-Administered Encounter Medications as of 05/17/2017  Medication  . sodium chloride flush (NS) 0.9 % injection 10 mL    Allergy:  Allergies  Allergen Reactions  . Lisinopril Cough    Social Hx:   Social History   Socioeconomic History  . Marital status: Single    Spouse name: Not on file  . Number of children: Not on file  . Years of education: Not on file  . Highest education level: Not on file  Occupational History  . Not on file  Social Needs  . Financial resource strain: Not on file  . Food insecurity:    Worry: Not on file    Inability: Not on file  . Transportation needs:    Medical: Not on file    Non-medical: Not on file  Tobacco Use  . Smoking status: Former Smoker    Packs/day: 1.00    Years: 8.00    Pack years: 8.00    Types: Cigarettes    Last attempt to quit: 08/19/2011    Years since quitting: 5.7  . Smokeless tobacco: Never Used  Substance and Sexual Activity  . Alcohol use: Yes    Alcohol/week: 0.0 oz    Comment: soc - maybe 3 drinks per month  . Drug use: Not on file  . Sexual activity: Not on file  Lifestyle  . Physical activity:    Days per week: Not on file    Minutes per session: Not on file  . Stress: Not on file  Relationships  . Social connections:    Talks on phone: Not on file    Gets together: Not on file    Attends religious service: Not on file    Active member of club or organization: Not on file    Attends meetings of clubs or organizations: Not on file     Relationship status: Not on file  . Intimate partner violence:    Fear of current or ex partner: Not on file    Emotionally abused: Not on file    Physically abused: Not on file    Forced sexual activity: Not on file  Other Topics Concern  . Not on file  Social History Narrative   Environmental education officer.    Past Surgical Hx:  Past Surgical History:  Procedure Laterality Date  . BREAST REDUCTION SURGERY    . CORONARY ANGIOPLASTY WITH STENT PLACEMENT  05-09-2013   dr Gwynneth Albright [facility], [place])   PCI  and DES x2 to prox. and mid LAD, overlapping (PREMIER);  60% mRCA treated medically  . CYSTOSCOPY WITH RETROGRADE PYELOGRAM, URETEROSCOPY AND STENT PLACEMENT Left 09/23/2016   Procedure: CYSTOSCOPY WITH LEFT  RETROGRADE PYELOGRAM, LEFT URETEROSCOPY LASER LITHOREIPSY AND STENT PLACEMENT;  Surgeon: Lucas Mallow, MD;  Location: [facility];  Service: Urology;  Laterality: Left;  . DILATION AND CURETTAGE OF UTERUS  11-09-2014  dr Alycia Rossetti [facility]   and placement IUD  . EXPLORATORY LAPAROTOMY/  RIGHT SALPINGOOPHORECTOMY/ RIGHT URETEROLYSIS/ INFRACOLIC OMENTECTOMY/ RIGHT PELVIC AND AORTIC LYMPH NODE DISSECTION'S/ STAGING BX'S  07-13-2014   dr Alycia Rossetti  [facility])  . HOLMIUM LASER APPLICATION Left 07/27/2954   Procedure: HOLMIUM LASER APPLICATION;  Surgeon: Lucas Mallow, MD;  Location: [facility];  Service: Urology;  Laterality: Left;  . MOUTH SURGERY    . ROBOTIC ASSISTED TOTAL HYSTERECTOMY  07-19-2015   dr Alycia Rossetti  [facility]   Left Salpinoophorectomy  . TRANSTHORACIC ECHOCARDIOGRAM     ef 55-60%/  mild MR and TR/  trivial PR    Past Medical Hx:  Past Medical History:  Diagnosis Date  . ADHD   . Anxiety   . Aortic atherosclerosis ([place])   . Depression   . History of non-ST elevation myocardial infarction (NSTEMI) 05/09/2013   s/p  PCI and DES x2  . Hypertension   . Left ureteral stone   . Malignant neoplasm of  unspecified ovary ([place]) onologist-  dr Alycia Rossetti ([facility] [place])--- per lov note 08-19-2016  no recurrence   dx 07-13-2014  right ovarian high grade serous carcinoma, Stage IIIA2,  chemotherapy 08-13-2014 to 09-26-2014;  07-19-2015 recurrence w/ left ovarian high grade serous carcinoma, chemotherpary 08-12-2015 to 12-16-2015  . Nephrolithiasis    bilateral per dr bell note (urologist)  . S/P drug eluting coronary stent placement 05-09-2013   [facility]   DES to prox. and mid LAD  overlapping 2 stents  . Ventral hernia     Past Gynecological History:   GYNECOLOGIC HISTORY:  Patient's last menstrual period was 08/27/2014.   Family Hx:  Family History  Problem Relation Age of Onset  . Colon polyps Father        about 2-3 removed following colonoscopy  . Hyperlipidemia Father   . Irritable bowel syndrome Brother        1 previous colonoscopy - no polyps found  . Breast cancer Paternal Aunt 40       "low level" breast cancer  . Prostate cancer Maternal Grandfather 14  . Lymphoma Maternal Grandfather 71       large B-cell lymphoma  . Heart Problems Paternal Grandmother   . Heart attack Paternal Grandfather   . Cancer Other 55       unknown type    Review of Systems:  Review of Systems  Constitutional: Negative.   Eyes: Negative.   Respiratory: Positive for shortness of breath.   Cardiovascular: Negative.   Gastrointestinal: Negative.   Endocrine: Positive for hot flashes.  Genitourinary: Negative.    Musculoskeletal: Negative.   Skin: Negative.   Neurological: Negative.   Hematological: Negative.   Psychiatric/Behavioral: Positive for depression. The patient is nervous/anxious.   Nose - "stopped up"   Vitals:  Blood pressure (!) 138/96, pulse 98, temperature 98.4 F (36.9 C), temperature source Oral, resp. rate 20, height 5' 3"  (1.6 m), weight 205 lb 6.4 oz (93.2 kg), last menstrual period 08/27/2014, SpO2 99 %. Body mass  index is 36.38 kg/m.  Physical  Exam: ECOG PERFORMANCE STATUS: 0 - Asymptomatic   General :  Well developed, 37 y.o., female in no apparent distress HEENT:  Normocephalic/atraumatic, symmetric, EOMI, eyelids normal Neck:   Supple, no masses.  Lymphatics:  No cervical/ submandibular/ supraclavicular/ infraclavicular/ inguinal adenopathy Respiratory:  Respirations unlabored, no use of accessory muscles CV:   Deferred Breast:  Deferred Musculoskeletal: No CVA tenderness, normal muscle strength. Abdomen:  Overweight,  soft, non-tender and nondistended. No evidence of hernia. No masses. Extremities:  No lymphedema, no erythema, non-tender. Skin:   Normal inspection Neuro/Psych:  No focal motor deficit, no abnormal mental status. Normal gait. Normal affect. Alert and oriented to person, place, and time  Genito Urinary: Vulva: Normal external female genitalia.  Bladder/urethra: Urethral meatus normal in size and location. No lesions or   masses, well supported bladder Speculum exam: Vagina: No lesion, no discharge, no bleeding. Cervix: Surgically absent Bimanual exam:  Uterus: Surgically absent  Adnexa: No masses. Rectovaginal:  Good tone, question mass on the anterior rectal wall versus scarring.  This feels to be over 7 cm from the anal verge.  No cul de sac nodularity, no parametrial involvement or nodularity.   Oncologic Summary: 1. 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 Efrain Sella x 6 completing 12/24/14 [facility])  She wished to have completion surgery which was performed 07/19/15 via robotic assistance to include hysterectomy and LSO and was found to have "recurrent" disease in the left ovary.   second round of chemo (Carbo/Taxol) at [facility] on completing 6 cycles on 12/16/15 2. BRCA negative. (21Gene Panel with GeneDx) 08/02/14     Assessment/Plan: 1. Her CT images were reviewed by me  personally.  The patient and I reviewed the findings in the report and I explained the concern for recurrence o My recommendation at this time is to follow-up with a PET scan to determine the likelihood of malignancy in this sigmoidal lesion since it does not appear to be accessible for interventional biopsy and to determine if there is any disease outside of this area that may be considered unresectable 2. We discussed return to surgery for debulking given the presumed local nature of the recurrence versus proceeding directly to chemotherapy 3. We touched on chemotherapy and the possible options. She does have some neuropathy and so a platinum doublet with consideration for alternative to Taxol this time around may be best.  o Briefly discussed PARP/avastin o I wonder with the pathology mentioning a borderline/low grade serous, although "noninvasive" if adding maintenance letrozole would be reasonable.  o I suspect she may wish to follow-up with Dr. Alycia Rossetti in the future since all of her active ovarian cancer care has been in [place] up to this point and will defer these maintenance discussions to her in that case. 4. For now we will schedule follow-up here in the office in the next 2 to 3 weeks with a PET scan prior.  If I do obtain the results before that follow-up visit I will determine whether she wants to keep an appointment with me or have a schedule her directly to [facility]. 5. She will likely need medical clearance given her past medical history although clinically she appears to be a surgical candidate. 6. We did discuss her Ca125 being is mildly elevated from her baseline but certainly not in the abnormal range 7. Her mother was  present during a large portion of the discussion and both the patient and mother expressed an understanding of the discussion and are anxious to move on to the next step. 8. She does get port flushes here with her lab draw.   Isabel Caprice, MD  05/19/2017,  4:23 PM

## 2017-05-17 NOTE — Patient Instructions (Signed)
1. We will get a PET scan to assess the area seen near your colon 2. RTC next week to review above 3. I will order a BRCA test on your original cancer tissue

## 2017-05-19 ENCOUNTER — Encounter: Payer: Self-pay | Admitting: Obstetrics

## 2017-05-20 ENCOUNTER — Encounter: Payer: Self-pay | Admitting: Gynecologic Oncology

## 2017-05-20 ENCOUNTER — Other Ambulatory Visit: Payer: Self-pay | Admitting: Gynecologic Oncology

## 2017-05-20 ENCOUNTER — Telehealth: Payer: Self-pay | Admitting: Gynecologic Oncology

## 2017-05-20 ENCOUNTER — Ambulatory Visit (HOSPITAL_COMMUNITY)
Admission: RE | Admit: 2017-05-20 | Discharge: 2017-05-20 | Disposition: A | Discharge status: Home / Self Care | Payer: BC Managed Care – PPO | Source: Ambulatory Visit | Attending: Obstetrics | Admitting: Obstetrics

## 2017-05-20 DIAGNOSIS — N200 Calculus of kidney: Secondary | ICD-10-CM | POA: Insufficient documentation

## 2017-05-20 DIAGNOSIS — K429 Umbilical hernia without obstruction or gangrene: Secondary | ICD-10-CM | POA: Insufficient documentation

## 2017-05-20 DIAGNOSIS — C563 Malignant neoplasm of bilateral ovaries: Secondary | ICD-10-CM

## 2017-05-20 DIAGNOSIS — C562 Malignant neoplasm of left ovary: Secondary | ICD-10-CM | POA: Insufficient documentation

## 2017-05-20 DIAGNOSIS — J320 Chronic maxillary sinusitis: Secondary | ICD-10-CM | POA: Insufficient documentation

## 2017-05-20 DIAGNOSIS — I700 Atherosclerosis of aorta: Secondary | ICD-10-CM | POA: Insufficient documentation

## 2017-05-20 DIAGNOSIS — C561 Malignant neoplasm of right ovary: Secondary | ICD-10-CM | POA: Diagnosis not present

## 2017-05-20 DIAGNOSIS — K381 Appendicular concretions: Secondary | ICD-10-CM | POA: Diagnosis not present

## 2017-05-20 DIAGNOSIS — J45909 Unspecified asthma, uncomplicated: Secondary | ICD-10-CM

## 2017-05-20 LAB — GLUCOSE, CAPILLARY: GLUCOSE-CAPILLARY: 113 mg/dL — AB (ref 65–99)

## 2017-05-20 MED ORDER — FLUDEOXYGLUCOSE F - 18 (FDG) INJECTION
10.1800 | Freq: Once | INTRAVENOUS | Status: AC | PRN
  Administered 2017-05-20: 10.18 via INTRAVENOUS

## 2017-05-20 MED ORDER — ALBUTEROL SULFATE HFA 108 (90 BASE) MCG/ACT IN AERS: 2.0000 | INHALATION_SPRAY | Freq: Four times a day (QID) | RESPIRATORY_TRACT | 6 refills | Status: DC | PRN

## 2017-05-20 NOTE — Telephone Encounter (Signed)
Called patient with PET scan results per her request.  Advised the results needed to be reviewed by Dr. Gerarda Fraction.  Discussed results.  Advised she would be contacted with further recommendations.

## 2017-05-20 NOTE — Progress Notes (Signed)
PET shows likely recurrence, but local. I recommend resection. She just needs to decide if she wants [place] to set her up with [facility]. Check with Alycia Rossetti if reasonable.

## 2017-05-21 ENCOUNTER — Encounter: Payer: Self-pay | Admitting: Gynecologic Oncology

## 2017-05-21 ENCOUNTER — Encounter: Payer: Self-pay | Admitting: Obstetrics

## 2017-05-21 ENCOUNTER — Telehealth: Payer: Self-pay | Admitting: Cardiology

## 2017-05-21 NOTE — Telephone Encounter (Signed)
Spoke to Dr. Alycia Rossetti: she request message be sent to Dr. Percival Spanish  Patient has hx of CAD and is followed by Dr. Percival Spanish.   She has unfortanately had a recocurrence with cancer and needs a procedure soon.  Dr. Alycia Rossetti did her surgery 2 years ago.  She is wondering if Dr. Percival Spanish needs to see patient prior or if he thinks it is okay to proceed without further cardiac workup.  She needs to have a laparoscopic biopsy.   Dr. Alycia Rossetti would like a call back on Monday to discuss.   Cell # [phone].

## 2017-05-23 NOTE — Telephone Encounter (Signed)
Yes.  I would like to see her this week.  I can see her on Tuesday afternoon.

## 2017-05-24 ENCOUNTER — Encounter: Payer: Self-pay | Admitting: Gynecologic Oncology

## 2017-05-24 ENCOUNTER — Inpatient Hospital Stay: Payer: BC Managed Care – PPO | Admitting: Obstetrics

## 2017-05-24 NOTE — Telephone Encounter (Signed)
Spoke to patient who is scheduled to see Dr. Percival Spanish tomorrow at 2pm.

## 2017-05-24 NOTE — Progress Notes (Signed)
Cardiology Office Note   Date:  05/25/2017   ID:  Shelley Vega, DOB 1980/03/11, MRN [id number]  PCP:  Dorothyann Gibbs, NP  Cardiologist:   Minus Breeding, MD    Chief Complaint  Patient presents with  . Pre-op Exam      History of Present Illness: Shelley Vega is a 37 y.o. female who presents for follow up of CAD.  She had a NSTEMI in 2015 at was managed at [facility]. She had a 99% thrombotic occlusion of a LAD at a bifurcation and required to overlapping Premier stents with aspiration thrombectomy.  OM had minor irregularities.    The RCA had 60% mid stenosis.  She had a normal EF.  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  She is here for pre procedure clearance.    He is going to have laparoscopic resection of tumor followed by Taxol and carboplatin therapy.  Prior to this she was feeling very well.  She is been exercising routinely at the [facility] 2-3 times per week since October.  She gets her heart rate up in the 150s and she has no symptoms. The patient denies any new symptoms such as chest discomfort, neck or arm discomfort. There has been no new shortness of breath, PND or orthopnea. There have been no reported palpitations, presyncope or syncope.    Past Medical History:  Diagnosis Date  . ADHD   . Anxiety   . Aortic atherosclerosis ([place])   . Depression   . History of non-ST elevation myocardial infarction (NSTEMI) 05/09/2013   s/p  PCI and DES x2  . Hypertension   . Left ureteral stone   . Malignant neoplasm of unspecified ovary ([place]) onologist-  dr Alycia Rossetti ([facility] [place])--- per lov note 08-19-2016  no recurrence   dx 07-13-2014  right ovarian high grade serous carcinoma, Stage IIIA2,  chemotherapy 08-13-2014 to 09-26-2014;  07-19-2015 recurrence w/ left ovarian high grade serous carcinoma, chemotherpary 08-12-2015 to 12-16-2015  . Nephrolithiasis    bilateral per dr bell note (urologist)  . S/P drug  eluting coronary stent placement 05-09-2013   [facility]   DES to prox. and mid LAD  overlapping 2 stents  . Ventral hernia     Past Surgical History:  Procedure Laterality Date  . BREAST REDUCTION SURGERY    . CORONARY ANGIOPLASTY WITH STENT PLACEMENT  05-09-2013   dr Gwynneth Albright [facility] [place])   PCI and DES x2 to prox. and mid LAD, overlapping (PREMIER);  60% mRCA treated medically  . CYSTOSCOPY WITH RETROGRADE PYELOGRAM, URETEROSCOPY AND STENT PLACEMENT Left 09/23/2016   Procedure: CYSTOSCOPY WITH LEFT  RETROGRADE PYELOGRAM, LEFT URETEROSCOPY LASER LITHOREIPSY AND STENT PLACEMENT;  Surgeon: Lucas Mallow, MD;  Location: [facility];  Service: Urology;  Laterality: Left;  . DILATION AND CURETTAGE OF UTERUS  11-09-2014  dr Alycia Rossetti [facility]   and placement IUD  . EXPLORATORY LAPAROTOMY/  RIGHT SALPINGOOPHORECTOMY/ RIGHT URETEROLYSIS/ INFRACOLIC OMENTECTOMY/ RIGHT PELVIC AND AORTIC LYMPH NODE DISSECTION'S/ STAGING BX'S  07-13-2014   dr Alycia Rossetti  [facility])  . HOLMIUM LASER APPLICATION Left 05/26/8500   Procedure: HOLMIUM LASER APPLICATION;  Surgeon: Lucas Mallow, MD;  Location: [facility];  Service: Urology;  Laterality: Left;  . MOUTH SURGERY    . ROBOTIC ASSISTED TOTAL HYSTERECTOMY  07-19-2015   dr Alycia Rossetti  [facility]   Left Salpinoophorectomy  .  TRANSTHORACIC ECHOCARDIOGRAM     ef 55-60%/  mild MR and TR/  trivial PR     Current Outpatient Medications  Medication Sig Dispense Refill  . albuterol (PROVENTIL HFA;VENTOLIN HFA) 108 (90 Base) MCG/ACT inhaler Inhale 2 puffs into the lungs every 6 (six) hours as needed for wheezing or shortness of breath. 1 Inhaler 6  . amphetamine-dextroamphetamine (ADDERALL XR) 30 MG 24 hr capsule Take 30 mg by mouth daily with breakfast. Only takes when she is at school    . amphetamine-dextroamphetamine (ADDERALL) 20 MG tablet Take 20 mg by mouth daily with lunch. Only takes when she is at  school    . aspirin 81 MG chewable tablet Chew 81 mg by mouth daily with breakfast.     . atorvastatin (LIPITOR) 40 MG tablet Take 1 tablet (40 mg total) by mouth at bedtime. 90 tablet 3  . cetirizine (ZYRTEC) 10 MG tablet Take 10 mg by mouth daily with breakfast.     . desvenlafaxine (PRISTIQ) 100 MG 24 hr tablet Take 100 mg by mouth daily.    Marland Kitchen losartan (COZAAR) 50 MG tablet Take 2 tablets (100 mg total) by mouth daily with breakfast. 90 tablet 3  . metoprolol succinate (TOPROL-XL) 100 MG 24 hr tablet Take 1 tablet (100 mg total) by mouth daily with breakfast. Take with or immediately following a meal. 90 tablet 3  . Multiple Vitamin (MULTIVITAMIN WITH MINERALS) TABS tablet Take 1 tablet by mouth daily with breakfast.     . nitroGLYCERIN (NITROSTAT) 0.4 MG SL tablet Place 0.4 mg under the tongue every 5 (five) minutes as needed for chest pain.    . prasugrel (EFFIENT) 10 MG TABS tablet Take 1 tablet (10 mg total) by mouth daily with breakfast. 90 tablet 3  . ALPRAZolam (XANAX) 0.5 MG tablet TK 1 T PO  TID PRN  3   No current facility-administered medications for this visit.    Facility-Administered Medications Ordered in Other Visits  Medication Dose Route Frequency Provider Last Rate Last Dose  . sodium chloride flush (NS) 0.9 % injection 10 mL  10 mL Intravenous PRN Nicholas Lose, MD   10 mL at 12/30/15 1022    Allergies:   Lisinopril    ROS:  Please see the history of present illness.   Otherwise, review of systems are positive for none.   All other systems are reviewed and negative.    PHYSICAL EXAM: VS:  BP (!) 138/95   Pulse (!) 113   Ht 5\' 3"  (1.6 m)   Wt 206 lb (93.4 kg)   LMP 08/27/2014   BMI 36.49 kg/m  , BMI Body mass index is 36.49 kg/m.  GENERAL:  Well appearing NECK:  No jugular venous distention, waveform within normal limits, carotid upstroke brisk and symmetric, no bruits, no thyromegaly LUNGS:  Clear to auscultation bilaterally CHEST:  Unremarkable HEART:   PMI not displaced or sustained,S1 and S2 within normal limits, no S3, no S4, no clicks, no rubs, no murmurs ABD:  Flat, positive bowel sounds normal in frequency in pitch, no bruits, no rebound, no guarding, no midline pulsatile mass, no hepatomegaly, no splenomegaly EXT:  2 plus pulses throughout, no edema, no cyanosis no clubbing    EKG:  EKG  ordered today. The ekg ordered and demonstrates sinus tachycardia, rate 113, poor R wave progression, QTC slightly prolonged, no acute ST-T wave changes.   Recent Labs: 06/03/2016: ALT 16; Platelets 209 09/23/2016: BUN 21; Creatinine, Ser 0.60; Hemoglobin 12.2; Potassium  3.5; Sodium 142      Wt Readings from Last 3 Encounters:  05/25/17 206 lb (93.4 kg)  05/17/17 205 lb 6.4 oz (93.2 kg)  12/16/16 201 lb (91.2 kg)      Other studies Reviewed: Additional studies/ records that were reviewed today include: None Review of the above records demonstrates:     ASSESSMENT AND PLAN:  CAD:   The patient has no new sypmtoms.  No further cardiovascular testing is indicated.  We will continue with aggressive risk reduction and meds as listed.  PREOP: The patient is a very high functional level.  She is not going for a high risk surgery.  She has no high risk symptoms or findings.  Therefore, according to ACC/AHA guidelines the patient is acceptable risk for the planned procedure.  She can hold her Effient for 7 days prior to procedure but I would like for her to continue aspirin throughout.  She should be back on DAPT after the procedure.  HTN:  The blood pressure is at target. No change in medications is indicated. We will continue with therapeutic lifestyle changes (TLC).  DYSLIPIDEMIA:    She needs a fasting lipid profile.  TACHYCARDIA:   She reports she has had some sinus trouble and she is been taking albuterol which caused her shakiness when she took it this afternoon and she says that is why her heart rate is up.  She will keep an eye on her  heart rate to make sure that it is not sustaining at this rate.  Current medicines are reviewed at length with the patient today.  The patient does not have concerns regarding medicines.  The following changes have been made:  None  Labs/ tests ordered today include: None  Orders Placed This Encounter  Procedures  . Lipid panel  . Hepatic function panel  . EKG 12-Lead     Disposition:   FU with me in 6 months.     Signed, Minus Breeding, MD  05/25/2017 3:08 PM    [facility]

## 2017-05-24 NOTE — Telephone Encounter (Signed)
Follow Up:    Pt  calling back about her surgery.

## 2017-05-25 ENCOUNTER — Ambulatory Visit: Payer: BC Managed Care – PPO | Admitting: Cardiology

## 2017-05-25 ENCOUNTER — Encounter: Payer: Self-pay | Admitting: Gynecologic Oncology

## 2017-05-25 ENCOUNTER — Encounter: Payer: Self-pay | Admitting: Cardiology

## 2017-05-25 VITALS — BP 138/95 | HR 113 | Ht 63.0 in | Wt 206.0 lb

## 2017-05-25 DIAGNOSIS — E785 Hyperlipidemia, unspecified: Secondary | ICD-10-CM | POA: Diagnosis not present

## 2017-05-25 DIAGNOSIS — Z01810 Encounter for preprocedural cardiovascular examination: Secondary | ICD-10-CM

## 2017-05-25 DIAGNOSIS — I2510 Atherosclerotic heart disease of native coronary artery without angina pectoris: Secondary | ICD-10-CM

## 2017-05-25 DIAGNOSIS — Z79899 Other long term (current) drug therapy: Secondary | ICD-10-CM | POA: Diagnosis not present

## 2017-05-25 NOTE — Patient Instructions (Signed)
Medication Instructions:  Continue current medications  If you need a refill on your cardiac medications before your next appointment, please call your pharmacy.  Labwork: Fasting Lipid Liver HERE IN OUR OFFICE AT [facility]  Take the provided lab slips with you to the lab for your blood draw.   You will need to fast. DO NOT EAT OR DRINK PAST MIDNIGHT.   Testing/Procedures: None Ordered  Follow-Up: Your physician wants you to follow-up in: 6 Months. You should receive a reminder letter in the mail two months in advance. If you do not receive a letter, please call our office [phone].     Thank you for choosing [facility] at [facility]!!

## 2017-05-31 ENCOUNTER — Encounter
Admit: 2017-05-31 | Discharge: 2017-05-31 | Payer: BLUE CROSS/BLUE SHIELD | Attending: Gynecologic Oncology | Primary: Gynecologic Oncology

## 2017-05-31 ENCOUNTER — Encounter: Admit: 2017-05-31 | Discharge: 2017-05-31 | Payer: BLUE CROSS/BLUE SHIELD

## 2017-05-31 DIAGNOSIS — N9489 Other specified conditions associated with female genital organs and menstrual cycle: Principal | ICD-10-CM

## 2017-05-31 DIAGNOSIS — J45909 Unspecified asthma, uncomplicated: Secondary | ICD-10-CM

## 2017-05-31 DIAGNOSIS — G4730 Sleep apnea, unspecified: Secondary | ICD-10-CM

## 2017-05-31 DIAGNOSIS — I10 Essential (primary) hypertension: Secondary | ICD-10-CM

## 2017-05-31 DIAGNOSIS — I2510 Atherosclerotic heart disease of native coronary artery without angina pectoris: Secondary | ICD-10-CM

## 2017-05-31 DIAGNOSIS — C561 Malignant neoplasm of right ovary: Secondary | ICD-10-CM

## 2017-05-31 DIAGNOSIS — Z01818 Encounter for other preprocedural examination: Principal | ICD-10-CM

## 2017-05-31 DIAGNOSIS — E669 Obesity, unspecified: Secondary | ICD-10-CM

## 2017-06-03 DIAGNOSIS — C561 Malignant neoplasm of right ovary: Principal | ICD-10-CM

## 2017-06-04 ENCOUNTER — Encounter: Admit: 2017-06-04 | Discharge: 2017-06-04 | Payer: BLUE CROSS/BLUE SHIELD

## 2017-06-04 ENCOUNTER — Encounter
Admit: 2017-06-04 | Discharge: 2017-06-04 | Payer: BLUE CROSS/BLUE SHIELD | Attending: Anesthesiology | Primary: Anesthesiology

## 2017-06-04 DIAGNOSIS — C561 Malignant neoplasm of right ovary: Principal | ICD-10-CM

## 2017-06-04 MED ORDER — OXYCODONE 5 MG CAPSULE
ORAL_CAPSULE | ORAL | 0 refills | 0.00000 days | Status: CP | PRN
Start: 2017-06-04 — End: 2017-06-09

## 2017-06-04 MED ORDER — IBUPROFEN 600 MG TABLET
ORAL_TABLET | Freq: Four times a day (QID) | ORAL | 0 refills | 0.00000 days | Status: CP | PRN
Start: 2017-06-04 — End: 2017-07-05

## 2017-06-07 ENCOUNTER — Encounter: Payer: Self-pay | Admitting: Gynecologic Oncology

## 2017-07-05 ENCOUNTER — Ambulatory Visit
Admit: 2017-07-05 | Discharge: 2017-07-05 | Payer: BLUE CROSS/BLUE SHIELD | Attending: Gynecologic Oncology | Primary: Gynecologic Oncology

## 2017-07-05 DIAGNOSIS — C569 Malignant neoplasm of unspecified ovary: Principal | ICD-10-CM

## 2017-07-05 MED ORDER — NYSTATIN 100,000 UNIT/GRAM TOPICAL CREAM
Freq: Two times a day (BID) | TOPICAL | 6 refills | 0 days | Status: CP
Start: 2017-07-05 — End: 2017-09-06

## 2017-07-05 MED ORDER — LETROZOLE 2.5 MG TABLET
ORAL_TABLET | Freq: Every day | ORAL | 12 refills | 0 days | Status: CP
Start: 2017-07-05 — End: 2018-01-18

## 2017-09-06 ENCOUNTER — Encounter: Admit: 2017-09-06 | Discharge: 2017-09-06 | Payer: BLUE CROSS/BLUE SHIELD

## 2017-09-06 ENCOUNTER — Encounter
Admit: 2017-09-06 | Discharge: 2017-09-06 | Payer: BLUE CROSS/BLUE SHIELD | Attending: Gynecologic Oncology | Primary: Gynecologic Oncology

## 2017-09-06 DIAGNOSIS — Z1371 Encounter for nonprocreative screening for genetic disease carrier status: Secondary | ICD-10-CM

## 2017-09-06 DIAGNOSIS — C561 Malignant neoplasm of right ovary: Principal | ICD-10-CM

## 2017-09-06 DIAGNOSIS — C569 Malignant neoplasm of unspecified ovary: Secondary | ICD-10-CM

## 2017-09-11 ENCOUNTER — Ambulatory Visit: Admit: 2017-09-11 | Discharge: 2017-09-12 | Payer: BLUE CROSS/BLUE SHIELD

## 2017-09-11 DIAGNOSIS — C561 Malignant neoplasm of right ovary: Principal | ICD-10-CM

## 2017-11-08 ENCOUNTER — Encounter
Admit: 2017-11-08 | Discharge: 2017-11-08 | Payer: BLUE CROSS/BLUE SHIELD | Attending: Gynecologic Oncology | Primary: Gynecologic Oncology

## 2017-11-08 ENCOUNTER — Encounter: Admit: 2017-11-08 | Discharge: 2017-11-08 | Payer: BLUE CROSS/BLUE SHIELD

## 2017-11-08 DIAGNOSIS — C561 Malignant neoplasm of right ovary: Principal | ICD-10-CM

## 2017-11-18 ENCOUNTER — Encounter (HOSPITAL_COMMUNITY): Payer: Self-pay | Admitting: *Deleted

## 2017-11-18 ENCOUNTER — Ambulatory Visit (HOSPITAL_COMMUNITY)
Admission: EM | Admit: 2017-11-18 | Discharge: 2017-11-18 | Disposition: A | Discharge status: Home / Self Care | Payer: BC Managed Care – PPO | Attending: Family Medicine | Admitting: Family Medicine

## 2017-11-18 ENCOUNTER — Other Ambulatory Visit: Payer: Self-pay

## 2017-11-18 DIAGNOSIS — J069 Acute upper respiratory infection, unspecified: Secondary | ICD-10-CM

## 2017-11-18 DIAGNOSIS — B9789 Other viral agents as the cause of diseases classified elsewhere: Secondary | ICD-10-CM | POA: Diagnosis not present

## 2017-11-18 MED ORDER — ALBUTEROL SULFATE HFA 108 (90 BASE) MCG/ACT IN AERS: 1.0000 | INHALATION_SPRAY | Freq: Four times a day (QID) | RESPIRATORY_TRACT | 0 refills | Status: DC | PRN

## 2017-11-18 MED ORDER — BENZONATATE 100 MG PO CAPS: 100.0000 mg | ORAL_CAPSULE | Freq: Three times a day (TID) | ORAL | 0 refills | Status: DC

## 2017-11-18 MED ORDER — GUAIFENESIN ER 600 MG PO TB12: 600.0000 mg | ORAL_TABLET | Freq: Two times a day (BID) | ORAL | 0 refills | Status: AC

## 2017-11-18 NOTE — Discharge Instructions (Addendum)
Continue using the Zyrtec and Flonase that you have at home Mucinex and Tessalon Perles sent to pharmacy for cough and congestion Follow up as needed for continued or worsening symptoms Use inhaler as needed

## 2017-11-18 NOTE — ED Triage Notes (Signed)
C/o tickle in her throat several days ago, c/o seh is now having sharp pain in her chest and cough and generalized bodayaches.

## 2017-11-18 NOTE — ED Provider Notes (Signed)
[place]    CSN: [id number] Arrival date & time: 11/18/17  1009     History   Chief Complaint Chief Complaint  Patient presents with  . Cough    HPI Shelley Vega is a 37 y.o. female.    URI  Presenting symptoms: congestion, cough, rhinorrhea and sore throat   Severity:  Mild Duration:  4 days Timing:  Constant Progression:  Waxing and waning Chronicity:  New Relieved by:  Nothing Worsened by:  Nothing Ineffective treatments:  OTC medications Associated symptoms: headaches, sinus pain and wheezing   Associated symptoms: no arthralgias, no myalgias and no neck pain   Risk factors: immunosuppression and sick contacts   Risk factors: no recent illness and no recent travel     Past Medical History:  Diagnosis Date  . ADHD   . Anxiety   . Aortic atherosclerosis ([place])   . Depression   . History of non-ST elevation myocardial infarction (NSTEMI) 05/09/2013   s/p  PCI and DES x2  . Hypertension   . Left ureteral stone   . Malignant neoplasm of unspecified ovary ([place]) onologist-  dr Alycia Rossetti ([facility] [place])--- per lov note 08-19-2016  no recurrence   dx 07-13-2014  right ovarian high grade serous carcinoma, Stage IIIA2,  chemotherapy 08-13-2014 to 09-26-2014;  07-19-2015 recurrence w/ left ovarian high grade serous carcinoma, chemotherpary 08-12-2015 to 12-16-2015  . Nephrolithiasis    bilateral per dr bell note (urologist)  . S/P drug eluting coronary stent placement 05-09-2013   [facility]   DES to prox. and mid LAD  overlapping 2 stents  . Ventral hernia     Patient Active Problem List   Diagnosis Date Noted  . Kidney stone on left side 02/21/2016  . Cough in adult 01/06/2016  . Hypokalemia 12/23/2015  . 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 Ovarian cancer, bilateral ([place]) 12/09/2015  . Neutropenia with fever ([place]) 11/28/2015  . Genetic testing 08/21/2014  . Family history of breast cancer in female  08/21/2014  . Family history of cancer 08/21/2014  . Coronary artery disease involving native coronary artery of native heart without angina pectoris 07/25/2014  . Dyslipidemia 07/25/2014  . Ovarian cancer ([place]) 07/23/2014  . Apnea, sleep 06/13/2013  . Acute non-ST segment elevation myocardial infarction ([place]) 05/09/2013  . ADD (attention deficit disorder) 04/25/2013  . Airway hyperreactivity 04/25/2013  . BP (high blood pressure) 04/25/2013  . Class 1 obesity 04/25/2013    Past Surgical History:  Procedure Laterality Date  . ABDOMINAL HYSTERECTOMY    . BREAST REDUCTION SURGERY    . CORONARY ANGIOPLASTY WITH STENT PLACEMENT  05-09-2013   dr Gwynneth Albright [facility] [place])   PCI and DES x2 to prox. and mid LAD, overlapping (PREMIER);  60% mRCA treated medically  . CYSTOSCOPY WITH RETROGRADE PYELOGRAM, URETEROSCOPY AND STENT PLACEMENT Left 09/23/2016   Procedure: CYSTOSCOPY WITH LEFT  RETROGRADE PYELOGRAM, LEFT URETEROSCOPY LASER LITHOREIPSY AND STENT PLACEMENT;  Surgeon: Lucas Mallow, MD;  Location: [facility];  Service: Urology;  Laterality: Left;  . DILATION AND CURETTAGE OF UTERUS  11-09-2014  dr Alycia Rossetti [facility]   and placement IUD  . EXPLORATORY LAPAROTOMY/  RIGHT SALPINGOOPHORECTOMY/ RIGHT URETEROLYSIS/ INFRACOLIC OMENTECTOMY/ RIGHT PELVIC AND AORTIC LYMPH NODE DISSECTION'S/ STAGING BX'S  07-13-2014   dr Alycia Rossetti  [facility])  . HOLMIUM LASER APPLICATION Left 02/24/4268   Procedure: HOLMIUM LASER APPLICATION;  Surgeon: Lucas Mallow, MD;  Location: [place];  Service: Urology;  Laterality: Left;  . MOUTH SURGERY    . ROBOTIC ASSISTED TOTAL HYSTERECTOMY  07-19-2015   dr Alycia Rossetti  [facility]   Left Salpinoophorectomy  . TRANSTHORACIC ECHOCARDIOGRAM     ef 55-60%/  mild MR and TR/  trivial PR    OB History   None      Home Medications    Prior to Admission medications   Medication Sig Start Date End Date  Taking? Authorizing Provider  albuterol (PROVENTIL HFA;VENTOLIN HFA) 108 (90 Base) MCG/ACT inhaler Inhale 1-2 puffs into the lungs every 6 (six) hours as needed for wheezing or shortness of breath. 11/18/17   Orvan July, NP  ALPRAZolam (XANAX) 0.5 MG tablet TK 1 T PO  TID PRN 04/24/17   [provider]  amphetamine-dextroamphetamine (ADDERALL XR) 30 MG 24 hr capsule Take 30 mg by mouth daily with breakfast. Only takes when she is at school    [provider]  amphetamine-dextroamphetamine (ADDERALL) 20 MG tablet Take 20 mg by mouth daily with lunch. Only takes when she is at school    [provider]  aspirin 81 MG chewable tablet Chew 81 mg by mouth daily with breakfast.     [provider]  atorvastatin (LIPITOR) 40 MG tablet Take 1 tablet (40 mg total) by mouth at bedtime. 11/02/16   Minus Breeding, MD  benzonatate (TESSALON) 100 MG capsule Take 1 capsule (100 mg total) by mouth every 8 (eight) hours. 11/18/17   Loura Halt A, NP  cetirizine (ZYRTEC) 10 MG tablet Take 10 mg by mouth daily with breakfast.     [provider]  desvenlafaxine (PRISTIQ) 100 MG 24 hr tablet Take 100 mg by mouth daily.    [provider]  guaiFENesin (MUCINEX) 600 MG 12 hr tablet Take 1 tablet (600 mg total) by mouth 2 (two) times daily for 7 days. 11/18/17 11/25/17  Orvan July, NP  losartan (COZAAR) 50 MG tablet Take 2 tablets (100 mg total) by mouth daily with breakfast. 11/02/16   Minus Breeding, MD  metoprolol succinate (TOPROL-XL) 100 MG 24 hr tablet Take 1 tablet (100 mg total) by mouth daily with breakfast. Take with or immediately following a meal. 11/02/16   Minus Breeding, MD  Multiple Vitamin (MULTIVITAMIN WITH MINERALS) TABS tablet Take 1 tablet by mouth daily with breakfast.     [provider]  nitroGLYCERIN (NITROSTAT) 0.4 MG SL tablet Place 0.4 mg under the tongue every 5 (five) minutes as needed for chest pain.    [provider]  prasugrel (EFFIENT) 10 MG TABS tablet Take 1 tablet (10 mg total) by mouth daily with breakfast. 11/02/16   Minus Breeding, MD    Family History Family History  Problem Relation Age of Onset  . Colon polyps Father        about 2-3 removed following colonoscopy  . Hyperlipidemia Father   . Irritable bowel syndrome Brother        1 previous colonoscopy - no polyps found  . Breast cancer Paternal Aunt 83       "low level" breast cancer  . Prostate cancer Maternal Grandfather 33  . Lymphoma Maternal Grandfather 71       large B-cell lymphoma  . Heart Problems Paternal Grandmother   . Heart attack Paternal Grandfather   . Cancer Other 40       unknown type    Social History Social History  Tobacco Use  . Smoking status: Former Smoker    Packs/day: 1.00    Years: 8.00    Pack years: 8.00    Types: Cigarettes    Last attempt to quit: 08/19/2011    Years since quitting: 6.2  . Smokeless tobacco: Never Used  Substance Use Topics  . Alcohol use: Yes    Alcohol/week: 0.0 standard drinks    Comment: soc - maybe 3 drinks per month  . Drug use: Never     Allergies   Lisinopril   Review of Systems Review of Systems  HENT: Positive for congestion, rhinorrhea, sinus pain and sore throat.   Respiratory: Positive for cough and wheezing.   Musculoskeletal: Negative for arthralgias, myalgias and neck pain.  Neurological: Positive for headaches.     Physical Exam Triage Vital Signs ED Triage Vitals  Enc Vitals Group     BP 11/18/17 1036 134/84     Pulse Rate 11/18/17 1036 (!) 105     Resp 11/18/17 1036 16     Temp 11/18/17 1036 98 F (36.7 C)     Temp Source 11/18/17 1036 Oral     SpO2 11/18/17 1036 93 %     Weight --      Height --      Head Circumference --      Peak Flow --      Pain Score 11/18/17 1038 4     Pain Loc --      Pain Edu? --      Excl. in [place]? --    No data found.  Updated Vital Signs BP 134/84 (BP Location: Right Arm)   Pulse (!) 105    Temp 98 F (36.7 C) (Oral)   Resp 16   LMP 08/27/2014   SpO2 93%   Visual Acuity Right Eye Distance:   Left Eye Distance:   Bilateral Distance:    Right Eye Near:   Left Eye Near:    Bilateral Near:     Physical Exam  Constitutional: She appears well-developed.  Very pleasant. Non toxic or ill appearing.   HENT:  Head: Normocephalic and atraumatic.  Bilateral TMs normal.  External ears normal.  Without posterior oropharyngeal erythema, tonsillar swelling or exudates. No lesions.  Mucous in nares.   No lymphadenopathy.    Eyes: Conjunctivae are normal.  Neck: Normal range of motion.  Cardiovascular: Normal rate, regular rhythm and normal heart sounds.  Pulmonary/Chest: Effort normal and breath sounds normal.  Lungs clear in all fields. No dyspnea or distress. No retractions or nasal flaring.   Musculoskeletal: Normal range of motion.  Lymphadenopathy:    She has no cervical adenopathy.  Neurological: She is alert.  Skin: Skin is warm and dry. No rash noted. No erythema. No pallor.  Psychiatric: She has a normal mood and affect.  Nursing note and vitals reviewed.    UC Treatments / Results  Labs (all labs ordered are listed, but only abnormal results are displayed) Labs Reviewed - No data to display  EKG None  Radiology No results found.  Procedures Procedures (including critical care time)  Medications Ordered in UC Medications - No data to display  Initial Impression / Assessment and Plan / UC Course  I have reviewed the triage vital signs and the nursing notes.  Pertinent labs & imaging results that were available during my care of the patient were reviewed by me and considered in my medical decision making (see chart for details).  Viral URI Symptomatic treatment Follow up as needed for continued or worsening symptoms  Final Clinical Impressions(s) / UC Diagnoses   Final diagnoses:  Viral URI with cough     Discharge Instructions       Continue using the Zyrtec and Flonase that you have at home Mucinex and Tessalon Perles sent to pharmacy for cough and congestion Follow up as needed for continued or worsening symptoms Use inhaler as needed     ED Prescriptions    Medication Sig Dispense Auth. Provider   guaiFENesin (MUCINEX) 600 MG 12 hr tablet Take 1 tablet (600 mg total) by mouth 2 (two) times daily for 7 days. 14 tablet Kana Reimann A, NP   benzonatate (TESSALON) 100 MG capsule Take 1 capsule (100 mg total) by mouth every 8 (eight) hours. 21 capsule Helen Winterhalter A, NP   albuterol (PROVENTIL HFA;VENTOLIN HFA) 108 (90 Base) MCG/ACT inhaler Inhale 1-2 puffs into the lungs every 6 (six) hours as needed for wheezing or shortness of breath. 1 Inhaler Loura Halt A, NP     Controlled Substance Prescriptions [place] Controlled Substance Registry consulted? Not Applicable   Orvan July, NP 11/18/17 1122

## 2017-11-28 ENCOUNTER — Other Ambulatory Visit: Payer: Self-pay | Admitting: Cardiology

## 2017-11-28 DIAGNOSIS — I10 Essential (primary) hypertension: Secondary | ICD-10-CM

## 2017-11-29 NOTE — Telephone Encounter (Signed)
Rx has been sent to the pharmacy electronically. ° °

## 2017-12-18 ENCOUNTER — Other Ambulatory Visit: Payer: Self-pay | Admitting: Cardiology

## 2017-12-27 NOTE — Unmapped (Signed)
GYNECOLOGIC ONCOLOGY RETURN VISIT        ASSESSMENT AND PLAN:  Tracy Trujillo 37 y.o. 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  She had persistent disease at the time of completion hysterectomy and USO and was treated with additional chemotherapy.  Most recently she had recurrent disease biopsies all confirm low-grade tumor.  She started with letrozole therapy at 2.5 mg/day.  However, today she has progressive disease and we discussed making a change to trametinib. The most usual risks and side effects were discussed with her by Hermelinda Medicus and me. I also placed a call to Dr. Jenene Slicker office ([phone]) and while he was out I was able to speak with one of his partners. They will contact patient to arrange an echocardiogram and EKG prior to starting therapy. She will call me once she has these tests done so that I can follow up with them.    Move forward with getting the prescription for trametinib written and verbal processes started.    Greater than 40 minutes face-to-face time was spent with the patient of which greater than 50% of it was in counseling and in coordinating care while she was here as I also spoke with her cardiologist's office..    CC:   Chief Complaint   Patient presents with   ??? Follow-up       HISTORY:  She??initially presented with ovarian cancer in 2016 and underwent laparotomy with RSO, staging. Pathology showed a high grde serous carcinoma with a background of serous borderline tumor with micropapillary features. She completed adjuvant tax/carb x6 then underwent completion hysterectomy and LSO 6 months after treatment. Given persistent disease, she was treated with 6 further cycles of tax/carb. Has been NED since then. Screening CTAP revealed??wall thickening and enhancing nodularity along the serosal surface of the sigmoid colon, worrisome for peritoneal disease.??PET findings concerning for recurrence (small deposits along the sigmoid colon).  ??  Preop Labs and Imaging:  ??  05/20/17 PET??IMPRESSION:  1. The small deposits along the sigmoid colon serosa are hypermetabolic, compatible with tumor implants. Maximum SUV 10.7. No  other metastatic spread identified.  2. Tonsillar and lymph node activity in the neck is likely physiologic. No pathologically enlarged lymph nodes in the neck.  3. Other imaging findings of potential clinical significance: Chronic ethmoid and maxillary sinusitis. Nonobstructive left nephrolithiasis with left renal scarring. Aortic Atherosclerosis (ICD10-I70.0). Right paraumbilical hernia containing part of the transverse colon. Appendicolith. Small left periurethral cystic lesion.    Procedure on 06/04/17: Diagnostic laparoscopy, biopsy of pelvic tumor nodules??  ??  Operative Findings: Diagnostic laparoscopy, biopsy of pelvic tumor nodules??  ??  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     Stage: Recurrent low grade serous ovarian carcinoma  Plan:  Discussion of risks and benefits of treatment options including cytoreductive surgery vs chemotherapy vs hormonal therapy.    I had released the results regarding the ER status to the patient.  Her K-ras testing was negative.  After lengthy discussion of options, she wished to proceed with letrozole and was started on that.    INTERVAL HISTORY:  Ct 8/19  IMPRESSION:  - Adjacent to the sigmoid colon and vaginal cuff, there are likely implants and focal areas of sigmoid wall thickening with small amount of adjacent inflammatory stranding. This  raises concern for metastatic implant/disease recurrence.  - Additional chronic/incidental findings, as above.    Ca-125 8.9 on 10/19. She comes in today for follow up and has a CT scan today.    CT today shows:  IMPRESSION:  Since 09/11/2017  -Increased pelvic peritoneal nodularity, suspicious for worsening metastatic disease.   -Increased nodularity in the left upper quadrant, suspicious for metastatic disease.    She did not seem surprised when I discussed these results with her.  She states that she has had a little bit more fatigue than she normally does.  She is also had a few more twinges in her abdomen and while they are fleeting are definitely more frequent.  She has one every month.  She denies any pain.  She states that with some dietary modifications and changes of anything her bowel habits are better.  She states that the biggest issue is fatigue.  She is quite bothered by the fact that as she keeps her spirits up and looks so good that nobody really understands what she is going through and this is bothersome to her.  She worries about having to tell her parents that she has progressive disease today.    Review of Systems   Constitutional: Denies fever, + weight gain.  Skin: Rash cleared  Cardiovascular: No chest pain, shortness of breath, or edema   Pulmonary: No cough   Gastro Intestinal: As above  Genitourinary: No frequency, urgency, or dysuria.  Denies vaginal bleeding and discharge.   Musculoskeletal: No pain.   Psychology: Some stress, more fatigue    Past Medical History:   Past Medical History:   Diagnosis Date   ??? ADHD (attention deficit hyperactivity disorder)    ??? Asthma 1996   ??? Hyperlipidemia    ??? Hypertension    ??? Hypertension    ??? Myocardial infarct (CMS-HCC)     drug eluding stent placement in LAD, 99% blockage per pt     Past Surgical History:   Past Surgical History:   Procedure Laterality Date   ??? CORONARY ANGIOPLASTY WITH STENT PLACEMENT      in LAD   ??? DENTAL SURGERY  2012   ??? PR DILATION/CURETTAGE,DIAGNOSTIC N/A 11/09/2014    Procedure: Dilation And Curettage, Diagnostic And/Or Therapeutic (Non Obstetrical);  Surgeon: Ermelinda Das, MD;  Location: MAIN OR [facility];  Service: Gynecology Oncology   ??? PR EXPLORATORY OF ABDOMEN N/A 06/04/2017    Procedure: Robotic Xi Exploratory Laparotomy, Exploratory Celiotomy With Or Without Biopsy(S);  Surgeon: Ermelinda Das, MD;  Location: [facility] OR [facility];  Service: Gynecology Oncology   ??? PR INSERT INTRAUTERINE DEVICE N/A 11/09/2014    Procedure: Insertion Of Intrauterine Device (Iud);  Surgeon: Ermelinda Das, MD;  Location: MAIN OR [facility];  Service: Gynecology Oncology   ??? PR LAPAROSCOPY W TOT HYSTERECTUTERUS <=250 GRAM  W TUBE/OVARY Bilateral 07/19/2015    Procedure: Robotic Laparoscopy, Surgical, With Total Hysterectomy, For Uterus 250 G Or Less; W/Removal Tube(S) And/Or Ovary(S);  Surgeon: Ermelinda Das, MD;  Location: MAIN OR [facility];  Service: Gynecology Oncology   ??? PR RELEASE URETER,RETROPER FIBROSIS Right 07/13/2014    Procedure: URETEROLYSIS, WITH OR WITHOUT REPOSITIONING OF URETER FOR RETROPERITONEAL FIBROSIS;  Surgeon: Thompson Caul, MD;  Location: MAIN OR [facility];  Service: Gynecology Oncology   ??? PR REMOVAL OF OMENTUM N/A 07/13/2014    Procedure: OMENTECTOMY, EPIPLOECTOMY, RESECTION OF OMENTUM;  Surgeon: Thompson Caul, MD;  Location: MAIN OR [facility];  Service: Gynecology Oncology   ???  PR REMOVAL OF OVARY/TUBE(S) Right 07/13/2014    Procedure: SALPINGO-OOPHORECTOMY, COMPLETE OR PARTIAL, UNILATERAL OR BILATERAL (SEPARATE PROCEDURE);  Surgeon: Thompson Caul, MD;  Location: MAIN OR [facility];  Service: Gynecology Oncology   ??? PR REMOVAL, PELVIC LYMPH NODES,STAGING Right 07/13/2014    Procedure: LIMITED LYMPHADENECTOMY FOR STAGING (SEPARATE PROCEDURE); PELVIC AND PARA-AORTIC;  Surgeon: Thompson Caul, MD;  Location: MAIN OR [facility];  Service: Gynecology Oncology   ??? REDUCTION MAMMAPLASTY  2001     Oncology History:      Malignant neoplasm of right ovary (CMS-HCC)    07/13/2014 Initial Diagnosis     Malignant neoplasm of right ovary (RAF-HCC)      07/13/2014 Surgery     Exploratory laparotomy with right salpingoophorectomy, right ureterolysis, infracolic omentectomy, right pelvic and aortic lymph node dissection, and staging biopsies      08/10/2014 - 12/10/2014 Chemotherapy     s/p 6 cycles of paclitaxel and carboplatin      07/19/2015 Surgery     robotic hysterectomy, LSO      07/19/2015 Recurrence      tumor on left ovary and uterine serosa      08/20/2015 Genetics     Patient has genetic testing done for ovarian cancer  Results revealed patient has the following mutation(s):  None, ER + tumor, KRAS -      11/2016 No evidence of disease         04/2017 Recurrence         06/04/2017 Surgery     + recurrence of LOW grade carcinoma      07/05/2017 Endocrine/Hormone Therapy     Letrozole started 2.5 mg daily      01/03/2018 Progression     Letrozole stopped and will move forward with tremetinib once cardiology evaluation is completed.       Family History:   Family History   Problem Relation Age of Onset   ??? Prostate cancer Other         grandfather   ??? Anesthesia problems Neg Hx      Social History:   Social History     Socioeconomic History   ??? Marital status: Single     Spouse name: None   ??? Number of children: None   ??? Years of education: None   ??? Highest education level: None   Occupational History   ??? None   Social Needs   ??? Financial resource strain: None   ??? Food insecurity:     Worry: None     Inability: None   ??? Transportation needs:     Medical: None     Non-medical: None   Tobacco Use   ??? Smoking status: Former Smoker     Last attempt to quit: 02/13/2011     Years since quitting: 6.8   ??? Smokeless tobacco: Never Used   Substance and Sexual Activity   ??? Alcohol use: Yes     Alcohol/week: 0.0 standard drinks     Comment: socially   ??? Drug use: No   ??? Sexual activity: None   Lifestyle   ??? Physical activity:     Days per week: None     Minutes per session: None   ??? Stress: None   Relationships   ??? Social connections:     Talks on phone: None     Gets together: None     Attends religious service: None     Active member of club or organization: None  Attends meetings of clubs or organizations: None Relationship status: None   Other Topics Concern   ??? None   Social History Narrative    Pt is single, works as an Wellsite geologist     Allergy:   Allergies   Allergen Reactions   ??? Lisinopril      Other reaction(s): Cough       CURRENT MEDICATIONS:  Outpatient Encounter Medications as of 01/03/2018   Medication Sig Dispense Refill   ??? albuterol HFA 90 mcg/actuation inhaler Inhale 2 puffs.     ??? ALPRAZolam (XANAX) 0.5 MG tablet TK 1 T PO  TID PRN  3   ??? ALPRAZolam (XANAX) 1 MG tablet Take 1 mg by mouth Three (3) times a day as needed for sleep.     ??? aspirin (ECOTRIN) 81 MG tablet Take 81 mg by mouth.     ??? atorvastatin (LIPITOR) 40 MG tablet Take 40 mg by mouth.     ??? atorvastatin (LIPITOR) 40 MG tablet TAKE 1 TABLET(40 MG) BY MOUTH EVERY NIGHT     ??? brexpiprazole (REXULTI) 1 mg Tab Take 2 mg by mouth daily.     ??? cetirizine (ZYRTEC) 10 MG tablet Take 10 mg by mouth.     ??? desvenlafaxine succinate (PRISTIQ) 100 MG 24 hr tablet Take 100 mg by mouth.     ??? dextroamphetamine-amphetamine 30 mg Tab Take 60 mg by mouth every morning.     ??? ferrous sulfate 325 (65 FE) MG tablet Take 325 mg by mouth daily with breakfast.     ??? letrozole (FEMARA) 2.5 mg tablet Take 1 tablet (2.5 mg total) by mouth daily. 30 tablet 12   ??? losartan (COZAAR) 50 MG tablet TAKE 1 TABLET(50 MG) BY MOUTH TWICE DAILY     ??? metoprolol succinate (TOPROL-XL) 100 MG 24 hr tablet Take 100 mg by mouth daily at 0600.     ??? metoprolol succinate (TOPROL-XL) 100 MG 24 hr tablet One tablet daily. NEEDS APPT FOR MORE REFILLS.     ??? multivitamin (THERAGRAN) per tablet Take 1 tablet by mouth daily.     ??? prasugrel (EFFIENT) 10 mg tablet Take 10 mg by mouth daily at 0600.     ??? albuterol (PROVENTIL HFA;VENTOLIN HFA) 90 mcg/actuation inhaler Inhale 2 puffs every six (6) hours as needed for wheezing. 1 Inhaler 0   ??? losartan (COZAAR) 50 MG tablet Take 50 mg by mouth Two (2) times a day.     ??? nitroglycerin (NITROSTAT) 0.4 MG SL tablet Place 0.4 mg under the tongue.     ??? trametinib 2 mg Tab Take 2 mg by mouth daily. 30 tablet 5   ??? [DISCONTINUED] dexamethasone (DECADRON) 4 MG tablet Take 2 tablets (8 mg total) by mouth daily. Take on Days 2, 3, and 4. (Patient not taking: Reported on 01/03/2018) 24 tablet 1     Facility-Administered Encounter Medications as of 01/03/2018   Medication Dose Route Frequency Provider Last Rate Last Dose   ??? [COMPLETED] iohexol (OMNIPAQUE) 240 mg iodine/mL oral solution 50 mL  50 mL Oral Once Ermelinda Das, MD   50 mL at 01/03/18 1157   ??? [COMPLETED] iohexol (OMNIPAQUE) 350 mg iodine/mL solution 100 mL  100 mL Intravenous Once in imaging Ermelinda Das, MD   100 mL at 01/03/18 1328       PHYSICAL EXAM:  Please refer to epic for vital signs.  General: Alert, oriented, no acute distress.    Neck: Supple, no  lymphadenopathy, no thyromegaly.    Lungs: Clear to auscultation bilaterally.    Cardiac: Regular rate and rhythm.    Abdomen: Obese, soft, nontender.  The bowel sounds are normoactive. No masses or hepatosplenomegaly appreciated.  Well-healed surgical incisions with no evidence of any incisional hernias.    Groins: No lymphadenopathy.    Extremities: No edema.    Pelvic: External genitalia within normal limits.  Bimanual examination is unremarkable. I cannot feel the nodule that I was able to on prior exams. RV exam confirms.    LABORATORY AND RADIOLOGIC STUDIES:    CA-125, CT scan reviewed with the patient.

## 2018-01-03 ENCOUNTER — Telehealth: Payer: Self-pay | Admitting: *Deleted

## 2018-01-03 ENCOUNTER — Ambulatory Visit: Admit: 2018-01-03 | Discharge: 2018-01-04 | Payer: BLUE CROSS/BLUE SHIELD

## 2018-01-03 ENCOUNTER — Encounter
Admit: 2018-01-03 | Discharge: 2018-01-04 | Payer: BLUE CROSS/BLUE SHIELD | Attending: Gynecologic Oncology | Primary: Gynecologic Oncology

## 2018-01-03 ENCOUNTER — Encounter: Admit: 2018-01-03 | Discharge: 2018-01-04 | Payer: BLUE CROSS/BLUE SHIELD

## 2018-01-03 DIAGNOSIS — I2510 Atherosclerotic heart disease of native coronary artery without angina pectoris: Secondary | ICD-10-CM

## 2018-01-03 DIAGNOSIS — Z5111 Encounter for antineoplastic chemotherapy: Secondary | ICD-10-CM

## 2018-01-03 DIAGNOSIS — C561 Malignant neoplasm of right ovary: Principal | ICD-10-CM

## 2018-01-03 DIAGNOSIS — Z1371 Encounter for nonprocreative screening for genetic disease carrier status: Secondary | ICD-10-CM

## 2018-01-03 MED ORDER — TRAMETINIB 2 MG TABLET
ORAL_TABLET | Freq: Every day | ORAL | 5 refills | 0 days | Status: CP
Start: 2018-01-03 — End: 2018-02-17
  Filled 2018-01-27: qty 30, 30d supply, fill #0

## 2018-01-03 NOTE — Telephone Encounter (Signed)
DOD call-Dr. Alycia Rossetti spoke to Dr. Ellyn Hack.  Patient to start chemotherapy-echo ordered per Dr. Ellyn Hack and will need follow up with APP or Dr. Percival Spanish after.

## 2018-01-03 NOTE — Telephone Encounter (Signed)
Shelley Vega unfortunate was diagnosed with recurrent recurrent ovarian cancer and is now about ready to start undergoing chemotherapy that is potentially cardiotoxic.  We have ordered an echocardiogram for pretreatment.  We will then arrange follow-up with either Dr. Percival Spanish or an APP.  Dr. Alycia Rossetti would like to get started on chemotherapy by early January.  Glenetta Hew, MD

## 2018-01-04 ENCOUNTER — Other Ambulatory Visit: Payer: Self-pay

## 2018-01-04 DIAGNOSIS — J45909 Unspecified asthma, uncomplicated: Secondary | ICD-10-CM

## 2018-01-04 MED ORDER — ALBUTEROL SULFATE HFA 108 (90 BASE) MCG/ACT IN AERS: 2.0000 | INHALATION_SPRAY | Freq: Four times a day (QID) | RESPIRATORY_TRACT | 0 refills | Status: AC | PRN

## 2018-01-06 NOTE — Unmapped (Signed)
[facility] Medication Referral: PA Approved      Medication (Brand/Generic): Mekinist    Final Test Claim completed with resulted information below:    Patient ABLE to fill at [facility]  Insurance Company:  [employer]  Anticipated Copay: $0  Is anticipated copay with a copay card or grant? No    Does patient's insurance plan only allow a 15 day supply for the first 6 fills in the Split Fill Program? No  If yes, inform patient they can request to dis-enroll from the [facility] by calling the patient help desk at N/A.      If the copay is under the $25 defined limit, per policy there will be no further investigation of need for financial assistance at this time unless patient requests. This referral has been communicated to the provider and handed off to the [facility] team for further processing and filling of prescribed medication.   ______________________________________________________________________  Please utilize this referral for viewing purposes as it will serve as the central location for all relevant documentation and updates.

## 2018-01-07 ENCOUNTER — Ambulatory Visit (HOSPITAL_COMMUNITY): Payer: BC Managed Care – PPO | Attending: Internal Medicine

## 2018-01-07 ENCOUNTER — Other Ambulatory Visit: Payer: Self-pay

## 2018-01-07 DIAGNOSIS — I2510 Atherosclerotic heart disease of native coronary artery without angina pectoris: Secondary | ICD-10-CM

## 2018-01-07 DIAGNOSIS — Z5111 Encounter for antineoplastic chemotherapy: Secondary | ICD-10-CM | POA: Diagnosis not present

## 2018-01-17 NOTE — Progress Notes (Signed)
Cardiology Office Note   Date:  01/18/2018   ID:  Shelley Vega, DOB 01/24/1980, MRN [id number]  PCP:  Shelley, Betty G, MD  Cardiologist:  [facility]  Chief Complaint  Patient presents with  . Coronary Artery Disease     History of Present Illness: Shelley Vega is a 37 y.o. female who presents for ongoing assessment and management of CAD with hx of NSTEMI in 2015, with 99% thrombotic occlusion of the LAD at the bifurcation requiring overlapping Premier stents with aspiration thrombectomy, RCA had 60% mid stenosis with normal EF; HTN HL,.   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 She was seen last by Dr. Percival Vega for pre-operative cardiac evaluation on 05/25/2017. She was found to have recurrent ovarian cancer 12/2017 and is to undergo chemotherapy which is potentially cardiotoxic. She was scheduled for echocardiogram to assess for pre-treatment. She is followed by Dr. Nancy Vega, oncologist with [facility]. Will be starting on Trametinib 2 mg daily which is cardiotoxic.   Echo completed on 01/07/2018 Normal biventricular function. LVEF 60-65%. No hemodynamically   significant valvular heart disease. Normal biatrial size.   Strain not performed.   If clinically required, consider repeat focused echocardiogram   for strain.  She comes today nervous, having drank 3 cups of coffee as she did not sleep well last night. She is without complaints of chest pain, DOE, bleeding or significant fatigue.  She is compliant with her medical regimen.   Past Medical History:  Diagnosis Date  . ADHD   . Anxiety   . Aortic atherosclerosis ([place])   . Depression   . History of non-ST elevation myocardial infarction (NSTEMI) 05/09/2013   s/p  PCI and DES x2  . Hypertension   . Left ureteral stone   . Malignant neoplasm of unspecified ovary ([place]) onologist-  dr Shelley Vega ([facility] [place])--- per lov note 08-19-2016  no recurrence   dx 07-13-2014  right ovarian  high grade serous carcinoma, Stage IIIA2,  chemotherapy 08-13-2014 to 09-26-2014;  07-19-2015 recurrence w/ left ovarian high grade serous carcinoma, chemotherpary 08-12-2015 to 12-16-2015  . Nephrolithiasis    bilateral per dr bell note (urologist)  . S/P drug eluting coronary stent placement 05-09-2013   [facility]   DES to prox. and mid LAD  overlapping 2 stents  . Ventral hernia     Past Surgical History:  Procedure Laterality Date  . ABDOMINAL HYSTERECTOMY    . BREAST REDUCTION SURGERY    . CORONARY ANGIOPLASTY WITH STENT PLACEMENT  05-09-2013   dr Gwynneth Albright Vanessa Clarendon [facility] [place])   PCI and DES x2 to prox. and mid LAD, overlapping (PREMIER);  60% mRCA treated medically  . CYSTOSCOPY WITH RETROGRADE PYELOGRAM, URETEROSCOPY AND STENT PLACEMENT Left 09/23/2016   Procedure: CYSTOSCOPY WITH LEFT  RETROGRADE PYELOGRAM, LEFT URETEROSCOPY LASER LITHOREIPSY AND STENT PLACEMENT;  Surgeon: Shelley Mallow, MD;  Location: [facility];  Service: Urology;  Laterality: Left;  . DILATION AND CURETTAGE OF UTERUS  11-09-2014  dr Shelley Vega [facility]   and placement IUD  . EXPLORATORY LAPAROTOMY/  RIGHT SALPINGOOPHORECTOMY/ RIGHT URETEROLYSIS/ INFRACOLIC OMENTECTOMY/ RIGHT PELVIC AND AORTIC LYMPH NODE DISSECTION'S/ STAGING BX'S  07-13-2014   dr Shelley Vega  [facility])  . HOLMIUM LASER APPLICATION Left 03/28/7671   Procedure: HOLMIUM LASER APPLICATION;  Surgeon: Shelley Mallow, MD;  Location: [facility];  Service: Urology;  Laterality: Left;  . MOUTH SURGERY    .  ROBOTIC ASSISTED TOTAL HYSTERECTOMY  07-19-2015   dr Shelley Vega  [facility]   Left Salpinoophorectomy  . TRANSTHORACIC ECHOCARDIOGRAM     ef 55-60%/  mild MR and TR/  trivial PR     Current Outpatient Medications  Medication Sig Dispense Refill  . albuterol (PROVENTIL HFA;VENTOLIN HFA) 108 (90 Base) MCG/ACT inhaler Inhale 2 puffs into the lungs every 6 (six) hours as needed for wheezing or  shortness of breath. 1 Inhaler 0  . ALPRAZolam (XANAX) 0.5 MG tablet TK 1 T PO  TID PRN  3  . amphetamine-dextroamphetamine (ADDERALL XR) 30 MG 24 hr capsule Take 30 mg by mouth daily with breakfast. Only takes when she is at school    . amphetamine-dextroamphetamine (ADDERALL) 20 MG tablet Take 20 mg by mouth daily with lunch. Only takes when she is at school    . aspirin 81 MG chewable tablet Chew 81 mg by mouth daily with breakfast.     . atorvastatin (LIPITOR) 40 MG tablet TAKE 1 TABLET(40 MG) BY MOUTH AT BEDTIME 90 tablet 0  . cetirizine (ZYRTEC) 10 MG tablet Take 10 mg by mouth daily with breakfast.     . desvenlafaxine (PRISTIQ) 100 MG 24 hr tablet Take 100 mg by mouth daily.    Marland Kitchen losartan (COZAAR) 50 MG tablet TAKE 2 TABLETS(100 MG) BY MOUTH DAILY WITH BREAKFAST 180 tablet 0  . metoprolol succinate (TOPROL-XL) 100 MG 24 hr tablet TAKE 1 TABLET BY MOUTH EVERY DAY WITH BREAKFAST 90 tablet 0  . Multiple Vitamin (MULTIVITAMIN WITH MINERALS) TABS tablet Take 1 tablet by mouth daily with breakfast.     . nitroGLYCERIN (NITROSTAT) 0.4 MG SL tablet Place 0.4 mg under the tongue every 5 (five) minutes as needed for chest pain.    . prasugrel (EFFIENT) 10 MG TABS tablet TAKE 1 TABLET(10 MG) BY MOUTH DAILY WITH BREAKFAST 90 tablet 0   No current facility-administered medications for this visit.    Facility-Administered Medications Ordered in Other Visits  Medication Dose Route Frequency Provider Last Rate Last Dose  . sodium chloride flush (NS) 0.9 % injection 10 mL  10 mL Intravenous PRN Nicholas Lose, MD   10 mL at 12/30/15 1022    Allergies:   Lisinopril    Social History:  The patient  reports that she quit smoking about 6 years ago. Her smoking use included cigarettes. She has a 8.00 pack-year smoking history. She has never used smokeless tobacco. She reports current alcohol use. She reports that she does not use drugs.   Family History:  The patient's family history includes Breast  cancer (age of onset: 48) in her paternal aunt; Cancer (age of onset: 64) in an other family member; Colon polyps in her father; Heart Problems in her paternal grandmother; Heart attack in her paternal grandfather; Hyperlipidemia in her father; Irritable bowel syndrome in her brother; Lymphoma (age of onset: 27) in her maternal grandfather; Prostate cancer (age of onset: 102) in her maternal grandfather.    ROS: All other systems are reviewed and negative. Unless otherwise mentioned in H&P    PHYSICAL EXAM: VS:  BP (!) 154/108   Pulse 80   Ht 5\' 3"  (1.6 m)   Wt 211 lb (95.7 kg)   LMP 08/27/2014   BMI 37.38 kg/m  , BMI Body mass index is 37.38 kg/m. GEN: Well nourished, well developed, in no acute distress HEENT: normal Neck: no JVD, carotid bruits, or masses Cardiac: RRR; no murmurs, rubs, or gallops,no edema  Respiratory:  Clear to auscultation bilaterally, normal work of breathing. Some upper airway congestion, cleared with cough.  GI: soft, nontender, nondistended, + BS MS: no deformity or atrophy Skin: warm and dry, no rash Neuro:  Strength and sensation are intact Psych: euthymic mood, full affect   EKG:  Sinus tachycardia with non-specific T wave abnormality. Rate of 113 bpm.  Recent Labs: No results found for requested labs within last 8760 hours.    Lipid Panel    Component Value Date/Time   CHOL 107 (L) 10/15/2014 0949   TRIG 239 (H) 10/15/2014 0949   HDL 24 (L) 10/15/2014 0949   CHOLHDL 4.5 10/15/2014 0949   VLDL 48 (H) 10/15/2014 0949   LDLCALC 35 10/15/2014 0949      Wt Readings from Last 3 Encounters:  01/18/18 211 lb (95.7 kg)  05/25/17 206 lb (93.4 kg)  05/17/17 205 lb 6.4 oz (93.2 kg)      Other studies Reviewed: None  ASSESSMENT AND PLAN:  1. CAD: Hx of NSTEMI in 2015, with 99% thrombotic occlusion of the LAD at the bifurcation requiring overlapping Premier stents with aspiration thrombectomy, RCA had 60% mid stenosis with normal EF.She  continues on DAPT with Effient and ASA. No complaints of bleeding or chest pain. No planned ischemic testing at this time.   2. Ovarian Cancer: Will be started on po chemotherapy with Trametinib 2 mg daily. Baseline Echo completed revealed normal biventricular function, LVEF of 0%-65%. Strain was not performed. She will be followed every 3 months by cardiology for ongoing surveillance of LV fx.   3. Hypertension: She is nervous today and drank a lot of coffee. I rechecked her BP again in the office, 160/80.She will continue losartan 100 mg BID and metoprolol 100 mg daily.  Follow up BP checks per PCP and oncology as chemo treatment can cause elevation in BP.    4.Hypercholesterolemia: Continue atorvastatin 40 mg daily. Goal of LDL < 70 in CAD patients.   Current medicines are reviewed at length with the patient today.    Labs/ tests ordered today include: Repeat echo in 3 months.   Phill Myron. West Pugh, ANP, [facility]   01/18/2018 7:56 AM    [address] Office [phone] Fax [phone]

## 2018-01-18 ENCOUNTER — Ambulatory Visit: Payer: BC Managed Care – PPO | Admitting: Adult Health

## 2018-01-18 ENCOUNTER — Encounter: Payer: Self-pay | Admitting: Adult Health

## 2018-01-18 VITALS — BP 154/108 | HR 80 | Ht 63.0 in | Wt 211.0 lb

## 2018-01-18 DIAGNOSIS — T451X5A Adverse effect of antineoplastic and immunosuppressive drugs, initial encounter: Secondary | ICD-10-CM

## 2018-01-18 DIAGNOSIS — E7800 Pure hypercholesterolemia, unspecified: Secondary | ICD-10-CM

## 2018-01-18 DIAGNOSIS — C569 Malignant neoplasm of unspecified ovary: Secondary | ICD-10-CM

## 2018-01-18 DIAGNOSIS — C801 Malignant (primary) neoplasm, unspecified: Secondary | ICD-10-CM

## 2018-01-18 DIAGNOSIS — I10 Essential (primary) hypertension: Secondary | ICD-10-CM | POA: Diagnosis not present

## 2018-01-18 DIAGNOSIS — I427 Cardiomyopathy due to drug and external agent: Secondary | ICD-10-CM

## 2018-01-18 DIAGNOSIS — I2510 Atherosclerotic heart disease of native coronary artery without angina pectoris: Secondary | ICD-10-CM

## 2018-01-18 MED ORDER — LOSARTAN POTASSIUM 100 MG PO TABS: ORAL_TABLET | ORAL | 3 refills | Status: DC

## 2018-01-18 MED ORDER — PRASUGREL HCL 10 MG PO TABS: ORAL_TABLET | ORAL | 3 refills | Status: AC

## 2018-01-18 MED ORDER — ATORVASTATIN CALCIUM 40 MG PO TABS: ORAL_TABLET | ORAL | 3 refills | Status: AC

## 2018-01-18 MED ORDER — METOPROLOL SUCCINATE ER 100 MG PO TB24: ORAL_TABLET | ORAL | 3 refills | Status: AC

## 2018-01-18 NOTE — Unmapped (Signed)
Patient Information: Tracy Trujillo is a 37 y.o. year-old female with low grade serous ovarian cancer who I am counseling today on trametinib.     Referring physician: Dr. Cleda Mccreedy    Pharmacy: [facility]    Confirmed Address and Phone number: Yes - updated in Epic    PMH: reviewed    Current medications: reviewed    Allergies: reviewed    Cultural assessment:   - Patient speaks [place], an interpreter is not needed.  - Patient is able to read and understand education materials at a high school level or above.  - Patient does not have any cultural barriers.  - Patient has no cognitive or physical impairments.    Education points:  1. Adherence to oral chemotherapy was discussed. If the patient is having difficulty remembering to take her dose there are a number of strategies that we can use in order to help.     2. Food/drug Considerations: Trametinib should be taken without food (1 hour before or 2 hours after a meal).    3. The following adverse effects were discussed, including related supportive care recommendations and emergency situations:        A. Diarrhea   B. Rash   C. Anemia   D. Thrombocytopenia   E. Neutropenia   F. LFT elevation   G. Cardiac toxicity    4. Drug Drug Interactions:  None per current medication list. Of note, patient had a baseline echo that was appropriate. She will continue with echocardiograms every 3 months per cardiologist recommendation while on trametinib therapy.    5. Specialty Pharmacy Information:  - Reviewed the services that the [facility] will provide and how to contact them [phone] option 4).  A representative from the pharmacy will contact the patient to remind them to set up their refill 7-10 days prior to the patient running out of medication.  Emphasized the importance of answering or returning phone calls from the [facility] to prevent delays in therapy.  The pharmacy MUST speak to the patient to schedule the refill.  - The pharmacist is available Monday through Friday 8:30am to 4:30pm.   - A pharmacist is available via pager 24/7 to answer any clinical questions you may have regarding your medication.  - Patient will receive a medication information handout and a [facility] Welcome Packet with their shipment.  - Your shipment will be delivered on 01/25/18 via UPS. and you  will not have to sign for the package.    Plan: Patient will receive medication on 01/25/18. Per patient report she was cleared by her cardiologist. Will confirm with primary oncologist prior to starting therapy.     F/u: Pending approval for start date by primary oncologist. Patient will then have follow up labs in 2 weeks after starting trametinib therapy.    I spent 15 minutes in direct patient care.    Hermelinda Medicus, PharmD, BCOP, CPP  Gynecologic Oncology Clinic Pharmacist  Pager: [phone]

## 2018-01-18 NOTE — Patient Instructions (Signed)
Medication Instructions:  NO CHANGES- Your physician recommends that you continue on your current medications as directed. Please refer to the Current Medication list given to you today.  If you need a refill on your cardiac medications before your next appointment, please call your pharmacy.  Labwork: NONE ORDERED  If you have labs (blood work) drawn today and your tests are completely normal, you will receive your results only by MyChart Message (if you have MyChart) -OR- A paper copy in the mail.  If you have any lab test that is abnormal or we need to change your treatment, we will call you to review these results.  Testing/Procedures: Echocardiogram-IN 3 MONTHS - Your physician has requested that you have an echocardiogram. Echocardiography is a painless test that uses sound waves to create images of your heart. It provides your doctor with information about the size and shape of your heart and how well your heart's chambers and valves are working. This procedure takes approximately one hour. There are no restrictions for this procedure. This will be performed at our [facility] location - [address].  Follow-Up: You will need a follow up appointment in 3 months-AFTER ECHO.  Please call our office 2 months in advance to schedule this appointment.  You may see Minus Breeding, MD Jory Sims, DNP, AACC  or one of the following Advanced Practice Providers on your designated Care Team:  Jory Sims, DNP, AACC  Rosaria Ferries, PA-C At [facility], you and your health needs are our priority.  As part of our continuing mission to provide you with exceptional heart care, we have created designated Provider Care Teams.  These Care Teams include your primary Cardiologist (physician) and Advanced Practice Providers (APPs -  Physician Assistants and Nurse Practitioners) who all work together to provide you with the care you need, when you need it.

## 2018-01-25 ENCOUNTER — Telehealth: Payer: Self-pay | Admitting: *Deleted

## 2018-01-25 ENCOUNTER — Other Ambulatory Visit: Payer: Self-pay | Admitting: Gynecologic Oncology

## 2018-01-25 DIAGNOSIS — C569 Malignant neoplasm of unspecified ovary: Secondary | ICD-10-CM

## 2018-01-25 NOTE — Progress Notes (Signed)
From [facility]: We will need a CBC w/diff and CMP w/mag every 2 weeks starting this week. Once she is stable (probably for at least a month or so) we will switch to a monthly CBC w/diff.

## 2018-01-25 NOTE — Telephone Encounter (Signed)
Called and left the patient a message to call the office back. Ask the patient to leave a message for the office with a day and time to have her labs/flush done.

## 2018-01-25 NOTE — Unmapped (Addendum)
Tracy Trujillo 's MEKINIST shipment will be delayed due to NEEDED NEW COPAY CARD, GOT COPAY CARD NOW We have contacted the patient and left a message We will wait for a call back from the patient/caregiver to reschedule the delivery.  We have confirmed the delivery date as 01/26/18 .

## 2018-01-26 ENCOUNTER — Telehealth: Payer: Self-pay | Admitting: *Deleted

## 2018-01-26 NOTE — Telephone Encounter (Signed)
Called and left the patient a message to call the office back. Need to schedule her for some lab/flush appts

## 2018-01-27 MED FILL — MEKINIST 2 MG TABLET: 30 days supply | Qty: 30 | Fill #0 | Status: AC

## 2018-01-28 ENCOUNTER — Telehealth: Payer: Self-pay | Admitting: *Deleted

## 2018-01-28 NOTE — Unmapped (Signed)
LVM for pt asking her to return [facility] to set up appt for port flush w/labs. They called her x3 to schedule appt and she has not returned their calls. sw

## 2018-01-28 NOTE — Telephone Encounter (Signed)
Attempted to contact the patient regarding scheduling her lab/flush appts. Left the patient a message to call the office back to schedule appt

## 2018-01-31 ENCOUNTER — Telehealth: Payer: Self-pay | Admitting: *Deleted

## 2018-01-31 NOTE — Telephone Encounter (Addendum)
Patient called to schedule her appts for lab/flush. Appts scheduled every two weeks starting 1/16. Patient to be scheduled for a port flush, and CBC/CMET w/ diff every two weeks

## 2018-01-31 NOTE — Telephone Encounter (Signed)
Attempted to return the patient's call, left a message to call the office back to schedule appts

## 2018-02-03 ENCOUNTER — Inpatient Hospital Stay: Payer: BC Managed Care – PPO

## 2018-02-03 ENCOUNTER — Other Ambulatory Visit: Payer: BC Managed Care – PPO

## 2018-02-03 ENCOUNTER — Inpatient Hospital Stay: Payer: BC Managed Care – PPO | Attending: Gynecologic Oncology

## 2018-02-03 DIAGNOSIS — C569 Malignant neoplasm of unspecified ovary: Secondary | ICD-10-CM | POA: Insufficient documentation

## 2018-02-03 DIAGNOSIS — Z452 Encounter for adjustment and management of vascular access device: Secondary | ICD-10-CM | POA: Diagnosis not present

## 2018-02-03 DIAGNOSIS — C562 Malignant neoplasm of left ovary: Secondary | ICD-10-CM

## 2018-02-03 DIAGNOSIS — C561 Malignant neoplasm of right ovary: Secondary | ICD-10-CM

## 2018-02-03 DIAGNOSIS — Z95828 Presence of other vascular implants and grafts: Secondary | ICD-10-CM

## 2018-02-03 DIAGNOSIS — D709 Neutropenia, unspecified: Secondary | ICD-10-CM

## 2018-02-03 DIAGNOSIS — C563 Malignant neoplasm of bilateral ovaries: Secondary | ICD-10-CM

## 2018-02-03 DIAGNOSIS — R5081 Fever presenting with conditions classified elsewhere: Secondary | ICD-10-CM

## 2018-02-03 LAB — COMPREHENSIVE METABOLIC PANEL
ALT: 21 U/L (ref 0–44)
AST: 17 U/L (ref 15–41)
Albumin: 3.8 g/dL (ref 3.5–5.0)
Alkaline Phosphatase: 98 U/L (ref 38–126)
Anion gap: 12 (ref 5–15)
BUN: 15 mg/dL (ref 6–20)
CHLORIDE: 103 mmol/L (ref 98–111)
CO2: 24 mmol/L (ref 22–32)
Calcium: 8.4 mg/dL — ABNORMAL LOW (ref 8.9–10.3)
Creatinine, Ser: 0.66 mg/dL (ref 0.44–1.00)
GFR calc Af Amer: >60 mL/min (ref >60)
GFR calc non Af Amer: >60 mL/min (ref >60)
Glucose, Bld: 103 mg/dL — ABNORMAL HIGH (ref 70–99)
Potassium: 3.4 mmol/L — ABNORMAL LOW (ref 3.5–5.1)
Sodium: 139 mmol/L (ref 135–145)
Total Bilirubin: 0.4 mg/dL (ref 0.3–1.2)
Total Protein: 7.2 g/dL (ref 6.5–8.1)

## 2018-02-03 LAB — CBC WITH DIFFERENTIAL (CANCER CENTER ONLY)
ABS IMMATURE GRANULOCYTES: 0.02 10*3/uL (ref 0.00–0.07)
BASOS ABS: 0 10*3/uL (ref 0.0–0.1)
Basophils Relative: 0 %
Eosinophils Absolute: 0.2 10*3/uL (ref 0.0–0.5)
Eosinophils Relative: 3 %
HCT: 37.3 % (ref 36.0–46.0)
Hemoglobin: 12.5 g/dL (ref 12.0–15.0)
Immature Granulocytes: 0 %
LYMPHS PCT: 33 %
Lymphs Abs: 2.1 10*3/uL (ref 0.7–4.0)
MCH: 30.6 pg (ref 26.0–34.0)
MCHC: 33.5 g/dL (ref 30.0–36.0)
MCV: 91.4 fL (ref 80.0–100.0)
Monocytes Absolute: 0.4 10*3/uL (ref 0.1–1.0)
Monocytes Relative: 6 %
Neutro Abs: 3.7 10*3/uL (ref 1.7–7.7)
Neutrophils Relative %: 58 %
PLATELETS: 246 10*3/uL (ref 150–400)
RBC: 4.08 MIL/uL (ref 3.87–5.11)
RDW: 13 % (ref 11.5–15.5)
WBC Count: 6.3 10*3/uL (ref 4.0–10.5)
nRBC: 0 % (ref 0.0–0.2)

## 2018-02-03 LAB — MAGNESIUM: MAGNESIUM: 1 mg/dL — AB (ref 1.7–2.4)

## 2018-02-03 MED ORDER — SODIUM CHLORIDE 0.9% FLUSH
10.0000 mL | INTRAVENOUS | Status: DC | PRN
  Administered 2018-02-03: 10 mL
  Filled 2018-02-03: qty 10

## 2018-02-03 MED ORDER — HEPARIN SOD (PORK) LOCK FLUSH 100 UNIT/ML IV SOLN
500.0000 [IU] | Freq: Once | INTRAVENOUS | Status: AC | PRN
  Administered 2018-02-03: 500 [IU]
  Filled 2018-02-03: qty 5

## 2018-02-03 MED ORDER — MAGNESIUM OXIDE 400 MG (241.3 MG MAGNESIUM) TABLET
ORAL_TABLET | Freq: Three times a day (TID) | ORAL | 1 refills | 0 days | Status: CP
Start: 2018-02-03 — End: 2018-08-02

## 2018-02-04 ENCOUNTER — Other Ambulatory Visit: Payer: Self-pay | Admitting: Gynecologic Oncology

## 2018-02-04 DIAGNOSIS — Z95828 Presence of other vascular implants and grafts: Secondary | ICD-10-CM

## 2018-02-04 MED ORDER — ALTEPLASE 2 MG IJ SOLR
2.0000 mg | Freq: Once | INTRAMUSCULAR | Status: DC | PRN
  Filled 2018-02-04: qty 2

## 2018-02-04 MED ORDER — HEPARIN SOD (PORK) LOCK FLUSH 100 UNIT/ML IV SOLN
500.0000 [IU] | INTRAVENOUS | Status: DC
  Filled 2018-02-04: qty 5

## 2018-02-04 MED ORDER — SODIUM CHLORIDE 0.9% FLUSH
10.0000 mL | INTRAVENOUS | Status: DC | PRN
  Filled 2018-02-04: qty 10

## 2018-02-04 MED ORDER — HEPARIN SOD (PORK) LOCK FLUSH 100 UNIT/ML IV SOLN
500.0000 [IU] | Freq: Once | INTRAVENOUS | Status: DC
  Filled 2018-02-04: qty 5

## 2018-02-04 NOTE — Unmapped (Signed)
LVM for pt informing her mg; precautions given. Informed her script sent to pharmacy and to please start asap. If symptomatic to proceed to ED; to start incorporating mg-rich foods in diet; can look online for list. To please call back. sw

## 2018-02-07 MED ORDER — CLINDAMYCIN PHOSPHATE 1 % TOPICAL SOLUTION
2 refills | 0 days | Status: CP
Start: 2018-02-07 — End: 2019-02-07

## 2018-02-07 NOTE — Unmapped (Signed)
Specialty Medication Follow-up    Tracy Trujillo is a 38 y.o. female with recurrent ovarian cancer who I am seeing for follow up on their treatment with trametinib.     Chemotherapy: Trametinib 2 mg PO daily  Start date: 01/28/18    A/P:   1. Oral Chemotherapy: CBC w/diff and CMP reviewed. Patient meets treatment parameters to start trametinib therapy. Will repeat labs in 2 weeks for assessment of tolerability.  ?? Start trametinib 2 mg PO daily  ?? Obtain CBC w/diff and CMP in 2 weeks    I spent approximately 5 minutes in direct patient care.    Next follow up: In 2 weeks with lab results    Referring physician: Dr. Para March, PharmD, BCOP, CPP  Gynecologic Oncology Clinic Pharmacist  Pager: [phone]    S/O: Tracy Trujillo was contacted regarding recent lab results. She confirmed that she has received trametinib therapy and started therapy on 01/28/18. She has no new questions or adverse effects to report at this time.    Medications reviewed and updated in EPIC? no    Missed doses: N/A    Labs (02/03/18)  WBC 6.3 x10*9/L   Hgb 12.5 g/dL   Plt 629 B28*4/X   ANC 3.7 x10*9/L   SCr 0.6 mg/dL   TBili 0.4 mg/dL   AST 17 U/L   ALT 21 U/L   Alk Phos 98 U/L

## 2018-02-07 NOTE — Unmapped (Signed)
Telephone Call: Rash    A/P  1. Acneiform rash: Grade 2 likely acneiform rash which is involving face and chest. The rash that develops from trametinib is best treated with antibiotic therapy. Will first try clindamycin topically and if not controlled consider doxycycline oral therapy or a dose reduction. Will re-assess in 2 weeks at next lab check.  ?? Start clindamycin 1% solution - AAA BID    S/O: Tracy Trujillo is a 38 y.o. female with low grade ovarian cancer currently receiving trametinib therapy. [name] contacted me regarding a new rash that started to develop yesterday on her face and chest. She does endorse bumps and tenderness but no itching associated with the rash. It is most bothersome due to the tenderness.      I spent approximately 10 minutes in patient care activities.    Referring physician: Dr. Para March, PharmD, BCOP, CPP  Gynecologic Oncology Clinic Pharmacist  Pager: [phone]

## 2018-02-14 MED ORDER — DOXYCYCLINE MONOHYDRATE 100 MG CAPSULE
ORAL_CAPSULE | Freq: Two times a day (BID) | ORAL | 2 refills | 0 days | Status: CP
Start: 2018-02-14 — End: 2018-05-15

## 2018-02-15 NOTE — Unmapped (Signed)
Telephone Call: Rash    A/P  1. Acneiform rash: Grade 2 mainly impacting face with minimal involvement of chest and back. Still uncontrolled despite topical clindamycin. Will try oral antibiotic and if no improvement will consider dose reduction to 1.5 mg PO daily  ?? Start doxycycline 100 mg PO BID (separate 2 hours from magnesium)  ?? Continue trametinib 2 mg PO daily  ?? Stop clindamycin topical solution    2. Diarrhea: Grade 1 diarrhea which is new and currently uncontrolled. Patient has not taken any medication for diarrhea. She will start OTC loperamide as needed for loose stools.   ?? Start loperamide 2 mg PO PRN after each loose stool    S/O: Tracy Trujillo is a 38 y.o. female with recurrent ovarian cancer currently receiving oral trametinib therapy. She called to report the rash on her face is not improving. It is pustular and very tender to the touch. She has been using clindamycin cream with minimal improvement. She does have some involvement on her chest and back but it is minimal compared to facial impact. She also reports that over the weekend she started to have loose stools. She is unsure if it is a stomach bug that she picked up vs. Adverse effect from trametinib. She has not taken medication for loose stools.     I spent approximately 10 minutes in patient care activities.    Referring physician: Dr. Para March, PharmD, BCOP, CPP  Gynecologic Oncology Clinic Pharmacist  Pager: [phone]

## 2018-02-17 ENCOUNTER — Inpatient Hospital Stay: Payer: BC Managed Care – PPO

## 2018-02-17 ENCOUNTER — Other Ambulatory Visit: Payer: BC Managed Care – PPO

## 2018-02-17 DIAGNOSIS — C563 Malignant neoplasm of bilateral ovaries: Secondary | ICD-10-CM

## 2018-02-17 DIAGNOSIS — C562 Malignant neoplasm of left ovary: Secondary | ICD-10-CM

## 2018-02-17 DIAGNOSIS — C569 Malignant neoplasm of unspecified ovary: Secondary | ICD-10-CM | POA: Diagnosis not present

## 2018-02-17 DIAGNOSIS — R5081 Fever presenting with conditions classified elsewhere: Secondary | ICD-10-CM

## 2018-02-17 DIAGNOSIS — Z95828 Presence of other vascular implants and grafts: Secondary | ICD-10-CM

## 2018-02-17 DIAGNOSIS — C561 Malignant neoplasm of right ovary: Secondary | ICD-10-CM

## 2018-02-17 DIAGNOSIS — D709 Neutropenia, unspecified: Secondary | ICD-10-CM

## 2018-02-17 LAB — COMPREHENSIVE METABOLIC PANEL
ALT: 26 U/L (ref 0–44)
AST: 24 U/L (ref 15–41)
Albumin: 4 g/dL (ref 3.5–5.0)
Alkaline Phosphatase: 125 U/L (ref 38–126)
Anion gap: 11 (ref 5–15)
BUN: 18 mg/dL (ref 6–20)
CHLORIDE: 104 mmol/L (ref 98–111)
CO2: 26 mmol/L (ref 22–32)
Calcium: 9.2 mg/dL (ref 8.9–10.3)
Creatinine, Ser: 0.76 mg/dL (ref 0.44–1.00)
GFR calc Af Amer: >60 mL/min (ref >60)
Glucose, Bld: 110 mg/dL — ABNORMAL HIGH (ref 70–99)
Potassium: 3.3 mmol/L — ABNORMAL LOW (ref 3.5–5.1)
Sodium: 141 mmol/L (ref 135–145)
Total Bilirubin: 0.5 mg/dL (ref 0.3–1.2)
Total Protein: 7.5 g/dL (ref 6.5–8.1)

## 2018-02-17 LAB — CBC WITH DIFFERENTIAL (CANCER CENTER ONLY)
Abs Immature Granulocytes: 0.03 10*3/uL (ref 0.00–0.07)
Basophils Absolute: 0 10*3/uL (ref 0.0–0.1)
Basophils Relative: 0 %
Eosinophils Absolute: 0.2 10*3/uL (ref 0.0–0.5)
Eosinophils Relative: 2 %
HCT: 39.4 % (ref 36.0–46.0)
HEMOGLOBIN: 13.2 g/dL (ref 12.0–15.0)
Immature Granulocytes: 0 %
LYMPHS PCT: 20 %
Lymphs Abs: 1.8 10*3/uL (ref 0.7–4.0)
MCH: 30.9 pg (ref 26.0–34.0)
MCHC: 33.5 g/dL (ref 30.0–36.0)
MCV: 92.3 fL (ref 80.0–100.0)
Monocytes Absolute: 0.6 10*3/uL (ref 0.1–1.0)
Monocytes Relative: 6 %
NEUTROS PCT: 72 %
Neutro Abs: 6.5 10*3/uL (ref 1.7–7.7)
Platelet Count: 249 10*3/uL (ref 150–400)
RBC: 4.27 MIL/uL (ref 3.87–5.11)
RDW: 13.2 % (ref 11.5–15.5)
WBC Count: 9.2 10*3/uL (ref 4.0–10.5)
nRBC: 0 % (ref 0.0–0.2)

## 2018-02-17 LAB — MAGNESIUM: MAGNESIUM: 1.3 mg/dL — AB (ref 1.7–2.4)

## 2018-02-17 MED ORDER — SODIUM CHLORIDE 0.9% FLUSH
10.0000 mL | INTRAVENOUS | Status: DC | PRN
  Administered 2018-02-17: 10 mL
  Filled 2018-02-17: qty 10

## 2018-02-17 MED ORDER — HEPARIN SOD (PORK) LOCK FLUSH 100 UNIT/ML IV SOLN
500.0000 [IU] | Freq: Once | INTRAVENOUS | Status: AC | PRN
  Administered 2018-02-17: 500 [IU]
  Filled 2018-02-17: qty 5

## 2018-02-17 MED ORDER — TRAMETINIB 0.5 MG TABLET
ORAL_TABLET | Freq: Every day | ORAL | 5 refills | 0 days | Status: CP
Start: 2018-02-17 — End: 2018-03-03
  Filled 2018-02-24: qty 90, 30d supply, fill #0

## 2018-02-17 NOTE — Unmapped (Signed)
[facility] Refill Coordination Note    Specialty Medication(s) to be Shipped:   Hematology/Oncology: Mekinist 2mg        Tracy Trujillo, DOB: May 27, 1980  Phone: [phone] (home)       All above HIPAA information was verified with patient.     Completed refill call assessment today to schedule patient's medication shipment from the [facility] [phone]).       Specialty medication(s) and dose(s) confirmed: Regimen is correct and unchanged.   Changes to medications: Cristian reports no changes reported at this time.  Changes to insurance: No  Questions for the pharmacist: No    The patient will receive a drug information handout for each medication shipped and additional FDA Medication Guides as required.      DISEASE/MEDICATION-SPECIFIC INFORMATION        N/A    ADHERENCE     Medication Adherence    Patient reported X missed doses in the last month:  0  Specialty Medication:  Mekinist 2mg   Patient is on additional specialty medications:  No  Patient is on more than two specialty medications:  No  Any gaps in refill history greater than 2 weeks in the last 3 months:  no  Demonstrates understanding of importance of adherence:  yes  Informant:  patient  Reliability of informant:  reliable  Provider-estimated medication adherence level:  good  Confirmed plan for next specialty medication refill:  delivery by pharmacy          Refill Coordination    Has the Patients' Contact Information Changed:  No  Is the Shipping Address Different:  No         MEDICARE PART B DOCUMENTATION     Mekinist 2mg : Patient has 8 days worth on hand.    SHIPPING     Shipping address confirmed in Epic.     Delivery Scheduled: Yes, Expected medication delivery date: 02/24/2018 via UPS or courier.     Medication will be delivered via UPS to the home address in Epic WAM.    Jorje Guild   [facility] Specialty Technician

## 2018-02-18 NOTE — Unmapped (Signed)
Change in Mekinist dosage decrease. Co-pay $TBD after calling co-pay assistance card at order entry level. Previous dose scheduled to fill 02/24/2018. Must schedule new RX in [facility] for delivery.

## 2018-02-24 ENCOUNTER — Emergency Department (HOSPITAL_COMMUNITY): Payer: BC Managed Care – PPO

## 2018-02-24 ENCOUNTER — Other Ambulatory Visit: Payer: Self-pay

## 2018-02-24 ENCOUNTER — Emergency Department (HOSPITAL_COMMUNITY)
Admission: EM | Admit: 2018-02-24 | Discharge: 2018-02-24 | Disposition: A | Discharge status: Home / Self Care | Payer: BC Managed Care – PPO | Attending: Emergency Medicine | Admitting: Emergency Medicine

## 2018-02-24 ENCOUNTER — Encounter (HOSPITAL_COMMUNITY): Payer: Self-pay

## 2018-02-24 DIAGNOSIS — Z87891 Personal history of nicotine dependence: Secondary | ICD-10-CM | POA: Insufficient documentation

## 2018-02-24 DIAGNOSIS — R042 Hemoptysis: Secondary | ICD-10-CM | POA: Insufficient documentation

## 2018-02-24 DIAGNOSIS — F329 Major depressive disorder, single episode, unspecified: Secondary | ICD-10-CM | POA: Insufficient documentation

## 2018-02-24 DIAGNOSIS — Z955 Presence of coronary angioplasty implant and graft: Secondary | ICD-10-CM | POA: Diagnosis not present

## 2018-02-24 DIAGNOSIS — I252 Old myocardial infarction: Secondary | ICD-10-CM | POA: Diagnosis not present

## 2018-02-24 DIAGNOSIS — I2510 Atherosclerotic heart disease of native coronary artery without angina pectoris: Secondary | ICD-10-CM | POA: Insufficient documentation

## 2018-02-24 DIAGNOSIS — I10 Essential (primary) hypertension: Secondary | ICD-10-CM | POA: Insufficient documentation

## 2018-02-24 DIAGNOSIS — F419 Anxiety disorder, unspecified: Secondary | ICD-10-CM | POA: Diagnosis not present

## 2018-02-24 DIAGNOSIS — F909 Attention-deficit hyperactivity disorder, unspecified type: Secondary | ICD-10-CM | POA: Insufficient documentation

## 2018-02-24 DIAGNOSIS — Z8543 Personal history of malignant neoplasm of ovary: Secondary | ICD-10-CM | POA: Insufficient documentation

## 2018-02-24 DIAGNOSIS — Z7982 Long term (current) use of aspirin: Secondary | ICD-10-CM | POA: Diagnosis not present

## 2018-02-24 DIAGNOSIS — Z79899 Other long term (current) drug therapy: Secondary | ICD-10-CM | POA: Diagnosis not present

## 2018-02-24 LAB — CBC WITH DIFFERENTIAL/PLATELET
ABS IMMATURE GRANULOCYTES: 0.05 10*3/uL (ref 0.00–0.07)
Basophils Absolute: 0 10*3/uL (ref 0.0–0.1)
Basophils Relative: 0 %
Eosinophils Absolute: 0.2 10*3/uL (ref 0.0–0.5)
Eosinophils Relative: 3 %
HCT: 37.9 % (ref 36.0–46.0)
Hemoglobin: 12.2 g/dL (ref 12.0–15.0)
Immature Granulocytes: 1 %
LYMPHS PCT: 24 %
Lymphs Abs: 1.8 10*3/uL (ref 0.7–4.0)
MCH: 30.9 pg (ref 26.0–34.0)
MCHC: 32.2 g/dL (ref 30.0–36.0)
MCV: 95.9 fL (ref 80.0–100.0)
Monocytes Absolute: 0.6 10*3/uL (ref 0.1–1.0)
Monocytes Relative: 8 %
Neutro Abs: 4.7 10*3/uL (ref 1.7–7.7)
Neutrophils Relative %: 64 %
Platelets: 233 10*3/uL (ref 150–400)
RBC: 3.95 MIL/uL (ref 3.87–5.11)
RDW: 13.4 % (ref 11.5–15.5)
WBC: 7.3 10*3/uL (ref 4.0–10.5)
nRBC: 0 % (ref 0.0–0.2)

## 2018-02-24 LAB — COMPREHENSIVE METABOLIC PANEL
ALT: 29 U/L (ref 0–44)
AST: 29 U/L (ref 15–41)
Albumin: 4.2 g/dL (ref 3.5–5.0)
Alkaline Phosphatase: 96 U/L (ref 38–126)
Anion gap: 10 (ref 5–15)
BILIRUBIN TOTAL: 0.4 mg/dL (ref 0.3–1.2)
BUN: 18 mg/dL (ref 6–20)
CO2: 24 mmol/L (ref 22–32)
Calcium: 8.8 mg/dL — ABNORMAL LOW (ref 8.9–10.3)
Chloride: 104 mmol/L (ref 98–111)
Creatinine, Ser: 0.61 mg/dL (ref 0.44–1.00)
GFR calc Af Amer: >60 mL/min (ref >60)
GFR calc non Af Amer: >60 mL/min (ref >60)
Glucose, Bld: 112 mg/dL — ABNORMAL HIGH (ref 70–99)
Potassium: 3 mmol/L — ABNORMAL LOW (ref 3.5–5.1)
Sodium: 138 mmol/L (ref 135–145)
TOTAL PROTEIN: 7.5 g/dL (ref 6.5–8.1)

## 2018-02-24 LAB — D-DIMER, QUANTITATIVE: D-Dimer, Quant: 0.36 ug/mL-FEU (ref 0.00–0.50)

## 2018-02-24 MED ORDER — IOPAMIDOL (ISOVUE-370) INJECTION 76%
100.0000 mL | Freq: Once | INTRAVENOUS | Status: AC | PRN
  Administered 2018-02-24: 100 mL via INTRAVENOUS

## 2018-02-24 MED ORDER — SODIUM CHLORIDE (PF) 0.9 % IJ SOLN
INTRAMUSCULAR | Status: AC
  Filled 2018-02-24: qty 50

## 2018-02-24 MED ORDER — HEPARIN SOD (PORK) LOCK FLUSH 100 UNIT/ML IV SOLN
500.0000 [IU] | Freq: Once | INTRAVENOUS | Status: AC
  Administered 2018-02-24: 500 [IU]
  Filled 2018-02-24: qty 5

## 2018-02-24 MED ORDER — IOPAMIDOL (ISOVUE-370) INJECTION 76%
INTRAVENOUS | Status: AC
  Filled 2018-02-24: qty 100

## 2018-02-24 MED FILL — MEKINIST 0.5 MG TABLET: 30 days supply | Qty: 90 | Fill #0 | Status: AC

## 2018-02-24 NOTE — ED Provider Notes (Signed)
[name] DEPT Provider Note   CSN: [id number] Arrival date & time: 02/24/18  1642     History   Chief Complaint Chief Complaint  Patient presents with  . Hemoptysis  . Cancer patient    HPI Shelley Vega is a 38 y.o. female.  HPI  Patient presents with concern of hemoptysis. Patient has multiple medical issues including prior MI, ongoing therapy with tamoxifen for cancer. She notes that she recently began tamoxifen, after recurrence of ovarian cancer. However, she also recently stopped the medication due to adverse effects including skin lesions on her face. Today she had an episode of hemoptysis, with cough, this occurred during a roughly 30-minute period, without clear precipitant, and she has had no similar episodes after the last production of blood, about 45 minutes prior to my evaluation. She denies current complaints including dyspnea, fever, chest pain, syncope, abdominal pain.   Past Medical History:  Diagnosis Date  . ADHD   . Anxiety   . Aortic atherosclerosis ([place])   . Depression   . History of non-ST elevation myocardial infarction (NSTEMI) 05/09/2013   s/p  PCI and DES x2  . Hypertension   . Left ureteral stone   . Malignant neoplasm of unspecified ovary ([place]) onologist-  dr Alycia Rossetti ([facility])--- per lov note 08-19-2016  no recurrence   dx 07-13-2014  right ovarian high grade serous carcinoma, Stage IIIA2,  chemotherapy 08-13-2014 to 09-26-2014;  07-19-2015 recurrence w/ left ovarian high grade serous carcinoma, chemotherpary 08-12-2015 to 12-16-2015  . Nephrolithiasis    bilateral per dr bell note (urologist)  . S/P drug eluting coronary stent placement 05-09-2013   [facility]   DES to prox. and mid LAD  overlapping 2 stents  . Ventral hernia     Patient Active Problem List   Diagnosis Date Noted  . Port-A-Cath in place 02/03/2018  . Kidney stone on left side 02/21/2016  . Cough in adult  01/06/2016  . Hypokalemia 12/23/2015  . Peripheral neuropathy due to chemotherapy ([place]) 12/10/2015  . Ovarian cancer, bilateral ([place]) 12/09/2015  . Neutropenia with fever ([place]) 11/28/2015  . Genetic testing 08/21/2014  . Family history of breast cancer in female 08/21/2014  . Family history of cancer 08/21/2014  . Coronary artery disease involving native coronary artery of native heart without angina pectoris 07/25/2014  . Dyslipidemia 07/25/2014  . Ovarian cancer ([place]) 07/23/2014  . Apnea, sleep 06/13/2013  . Acute non-ST segment elevation myocardial infarction ([place]) 05/09/2013  . ADD (attention deficit disorder) 04/25/2013  . Airway hyperreactivity 04/25/2013  . BP (high blood pressure) 04/25/2013  . Class 1 obesity 04/25/2013    Past Surgical History:  Procedure Laterality Date  . ABDOMINAL HYSTERECTOMY    . BREAST REDUCTION SURGERY    . CORONARY ANGIOPLASTY WITH STENT PLACEMENT  05-09-2013   dr Gwynneth Albright [facility] [place])   PCI and DES x2 to prox. and mid LAD, overlapping (PREMIER);  60% mRCA treated medically  . CYSTOSCOPY WITH RETROGRADE PYELOGRAM, URETEROSCOPY AND STENT PLACEMENT Left 09/23/2016   Procedure: CYSTOSCOPY WITH LEFT  RETROGRADE PYELOGRAM, LEFT URETEROSCOPY LASER LITHOREIPSY AND STENT PLACEMENT;  Surgeon: Lucas Mallow, MD;  [facility]: [facility];  Service: Urology;  Laterality: Left;  . DILATION AND CURETTAGE OF UTERUS  11-09-2014  dr Alycia Rossetti [facility]   and placement IUD  . EXPLORATORY LAPAROTOMY/  RIGHT SALPINGOOPHORECTOMY/ RIGHT URETEROLYSIS/ INFRACOLIC OMENTECTOMY/ RIGHT PELVIC AND AORTIC LYMPH NODE DISSECTION'S/ STAGING BX'S  07-13-2014   dr Alycia Rossetti  [facility])  . HOLMIUM LASER APPLICATION Left 07/25/7207   Procedure: HOLMIUM LASER APPLICATION;  Surgeon: Lucas Mallow, MD;  Location: [facility];  Service: Urology;  Laterality: Left;  . MOUTH SURGERY    . ROBOTIC ASSISTED TOTAL HYSTERECTOMY  07-19-2015    dr Alycia Rossetti  [facility]   Left Salpinoophorectomy  . TRANSTHORACIC ECHOCARDIOGRAM     ef 55-60%/  mild MR and TR/  trivial PR     OB History   No obstetric history on file.      Home Medications    Prior to Admission medications   Medication Sig Start Date End Date Taking? Authorizing Provider  albuterol (PROVENTIL HFA;VENTOLIN HFA) 108 (90 Base) MCG/ACT inhaler Inhale 2 puffs into the lungs every 6 (six) hours as needed for wheezing or shortness of breath. 01/04/18  Yes Cross, Melissa D, NP  ALPRAZolam (XANAX) 0.5 MG tablet Take 0.5 mg by mouth 3 (three) times daily as needed for anxiety.  04/24/17  Yes [provider]  amphetamine-dextroamphetamine (ADDERALL XR) 20 MG 24 hr capsule Take 20 mg by mouth daily.  02/24/18  Yes [provider]  amphetamine-dextroamphetamine (ADDERALL) 30 MG tablet Take 30 mg by mouth daily.  02/14/18  Yes [provider]  aspirin 81 MG chewable tablet Chew 81 mg by mouth daily with breakfast.    Yes [provider]  atorvastatin (LIPITOR) 40 MG tablet TAKE 1 TABLET(40 MG) BY MOUTH AT BEDTIME Patient taking differently: Take 40 mg by mouth daily.  01/18/18  Yes Lendon Colonel, NP  cetirizine (ZYRTEC) 10 MG tablet Take 10 mg by mouth daily with breakfast.    Yes [provider]  clindamycin (CLEOCIN T) 1 % external solution Apply 1 application topically daily.  02/07/18  Yes [provider]  desvenlafaxine (PRISTIQ) 100 MG 24 hr tablet Take 100 mg by mouth daily.   Yes [provider]  doxycycline (MONODOX) 100 MG capsule Take 100 mg by mouth daily.  02/14/18  Yes [provider]  losartan (COZAAR) 100 MG tablet 1 TAB DAILY-NOTE NEW DOSE Patient taking differently: Take 100 mg by mouth daily.  01/18/18  Yes Lendon Colonel, NP  MAGNESIUM-OXIDE 400 (241.3 Mg) MG tablet Take 400 mg by mouth 4 (four) times daily.  02/07/18  Yes [provider]  metoprolol succinate  (TOPROL-XL) 100 MG 24 hr tablet TAKE 1 TABLET BY MOUTH EVERY DAY WITH BREAKFAST 01/18/18  Yes Lendon Colonel, NP  prasugrel (EFFIENT) 10 MG TABS tablet TAKE 1 TABLET(10 MG) BY MOUTH DAILY WITH BREAKFAST 01/18/18  Yes Lendon Colonel, NP  REXULTI 1 MG TABS Take 1 tablet by mouth at bedtime.  02/07/18  Yes [provider]  MEKINIST 2 MG tablet Take 2 mg by mouth daily.  01/25/18   [provider]  Multiple Vitamin (MULTIVITAMIN WITH MINERALS) TABS tablet Take 1 tablet by mouth daily with breakfast.     [provider]  nitroGLYCERIN (NITROSTAT) 0.4 MG SL tablet Place 0.4 mg under the tongue every 5 (five) minutes as needed for chest pain.    [provider]    Family History Family History  Problem Relation Age of Onset  . Colon polyps Father        about 2-3 removed following colonoscopy  . Hyperlipidemia Father   . Irritable bowel syndrome Brother        1 previous colonoscopy - no polyps found  .  Breast cancer Paternal Aunt 47       "low level" breast cancer  . Prostate cancer Maternal Grandfather 63  . Lymphoma Maternal Grandfather 71       large B-cell lymphoma  . Heart Problems Paternal Grandmother   . Heart attack Paternal Grandfather   . Cancer Other 61       unknown type    Social History Social History   Tobacco Use  . Smoking status: Former Smoker    Packs/day: 1.00    Years: 8.00    Pack years: 8.00    Types: Cigarettes    Last attempt to quit: 08/19/2011    Years since quitting: 6.5  . Smokeless tobacco: Never Used  Substance Use Topics  . Alcohol use: Yes    Alcohol/week: 0.0 standard drinks    Comment: soc - maybe 3 drinks per month  . Drug use: Never     Allergies   Lisinopril   Review of Systems Review of Systems  Constitutional:       Per HPI, otherwise negative  HENT:       Per HPI, otherwise negative  Respiratory:       Per HPI, otherwise negative  Cardiovascular:       Per HPI, otherwise negative    Gastrointestinal: Negative for vomiting.  Endocrine:       Negative aside from HPI  Genitourinary:       Neg aside from HPI   Musculoskeletal:       Per HPI, otherwise negative  Skin: Negative.   Allergic/Immunologic: Positive for immunocompromised state.  Neurological: Negative for syncope.     Physical Exam Updated Vital Signs BP 140/86 (BP Location: Left Arm)   Pulse 74   Temp 98.2 F (36.8 C) (Oral)   Resp (!) 25   Ht 5\' 3"  (1.6 m)   Wt 95.3 kg   LMP 08/27/2014   SpO2 100%   BMI 37.20 kg/m   Physical Exam Vitals signs and nursing note reviewed.  Constitutional:      General: She is not in acute distress.    Appearance: She is well-developed.  HENT:     Head: Normocephalic and atraumatic.  Eyes:     Conjunctiva/sclera: Conjunctivae normal.  Cardiovascular:     Rate and Rhythm: Normal rate and regular rhythm.  Pulmonary:     Effort: Pulmonary effort is normal. No respiratory distress.     Breath sounds: Normal breath sounds. No stridor.  Abdominal:     General: There is no distension.  Skin:    General: Skin is warm and dry.     Findings: Rash present.     Comments: Papular facial lesions  Neurological:     Mental Status: She is alert and oriented to person, place, and time.     Cranial Nerves: No cranial nerve deficit.      ED Treatments / Results  Labs (all labs ordered are listed, but only abnormal results are displayed) Labs Reviewed  COMPREHENSIVE METABOLIC PANEL - Abnormal; Notable for the following components:      Result Value   Potassium 3.0 (*)    Glucose, Bld 112 (*)    Calcium 8.8 (*)    All other components within normal limits  D-DIMER, QUANTITATIVE (NOT AT [facility])  CBC WITH DIFFERENTIAL/PLATELET    EKG None  Radiology Dg Chest 2 View  Result Date: 02/24/2018 CLINICAL DATA:  Productive cough with bright red blood. EXAM: CHEST - 2 VIEW COMPARISON:  11/23/2016  FINDINGS: The lungs are clear without focal pneumonia, edema,  pneumothorax or pleural effusion. The cardiopericardial silhouette is within normal limits for size. Right Port-A-Cath remains in place with tip overlying the mid SVC. The visualized bony structures of the thorax are intact. Telemetry leads overlie the chest. IMPRESSION: No active cardiopulmonary disease. Electronically Signed   By: Misty Stanley M.D.   On: 02/24/2018 18:07   Ct Angio Chest Pe W And/or Wo Contrast  Result Date: 02/24/2018 CLINICAL DATA:  Ovarian cancer with hemoptysis. Chest pain. EXAM: CT ANGIOGRAPHY CHEST WITH CONTRAST TECHNIQUE: Multidetector CT imaging of the chest was performed using the standard protocol during bolus administration of intravenous contrast. Multiplanar CT image reconstructions and MIPs were obtained to evaluate the vascular anatomy. CONTRAST:  148mL ISOVUE-370 IOPAMIDOL (ISOVUE-370) INJECTION 76% COMPARISON:  06/03/2016 FINDINGS: Cardiovascular: Good opacification of the central and segmental pulmonary arteries. No focal filling defects. No evidence of significant pulmonary embolus. Normal caliber thoracic aorta. No aortic dissection. Great vessel origins are patent. Normal heart size. No pericardial effusions. Coronary artery calcifications. Coronary artery stents. Mediastinum/Nodes: No significant lymphadenopathy. Esophagus is decompressed. Small esophageal hiatal hernia. Lungs/Pleura: Motion artifact limits examination. No airspace disease or consolidation. No pleural effusions. No pneumothorax. Airways are patent. Upper Abdomen: No acute changes. Musculoskeletal: No chest wall abnormality. No acute or significant osseous findings. Review of the MIP images confirms the above findings. IMPRESSION: No evidence of significant pulmonary embolus. No evidence of active pulmonary disease. Electronically Signed   By: Lucienne Capers M.D.   On: 02/24/2018 20:29    Procedures Procedures (including critical care time)  Medications Ordered in ED Medications  sodium chloride  (PF) 0.9 % injection (has no administration in time range)  iopamidol (ISOVUE-370) 76 % injection (has no administration in time range)  iopamidol (ISOVUE-370) 76 % injection 100 mL (100 mLs Intravenous Contrast Given 02/24/18 2012)     Initial Impression / Assessment and Plan / ED Course  I have reviewed the triage vital signs and the nursing notes.  Pertinent labs & imaging results that were available during my care of the patient were reviewed by me and considered in my medical decision making (see chart for details).    Update: Patient has had one additional episode of small hemoptysis she remains hemodynamically unremarkable, has no other complaints. 9:00 PM Patient in no distress, no additional episodes of hemoptysis per With a lengthy conversation with both her parents present about possibilities for her hemoptysis given the reassuring CT scan.  She remains hemodynamically unremarkable, with no pain, no lightheadedness, eating dinner, without complication.  9 we discussed the need for close follow-up, consideration of primary care for management of her competing pharmaceutical needs with chemotherapy and antiplatelet management.  Patient has strong preference for discharge with close outpatient follow-up, and we had a specific conversation about return precautions. Patient discharged in stable condition. Final Clinical Impressions(s) / ED Diagnoses  Hemoptysis   Carmin Muskrat, MD 02/24/18 2101

## 2018-02-24 NOTE — Discharge Instructions (Addendum)
As discussed, it is very important that you follow-up with your oncologist, and establish care with a primary care physician for further discussion of tonight's episode, as well as for further medication management.  Equally important is that you monitor your condition carefully, and do not hesitate to return here for any concerning changes in your condition.

## 2018-02-24 NOTE — ED Notes (Signed)
Patient transported to CT 

## 2018-02-24 NOTE — ED Notes (Signed)
Pt and visitor verbalized discharge instructions and follow up care. Port deaccessed. Pt alert and ambulatory

## 2018-02-24 NOTE — ED Notes (Signed)
Port accessed with aseptic technique, flushed well, good blood return.

## 2018-02-24 NOTE — ED Notes (Signed)
Pt ambulated to the restroom without any assistance. Gait steady

## 2018-02-24 NOTE — ED Triage Notes (Signed)
Patient states she had to cough and then felt wheezy. Patient had a productive cough with bright red blood. Patient sent patients of the bloody sputum to her physicians at [facility]. Patient states she was told it could be a side effect of chemo drug Mekinist. Patient has not had Mekinist x 1 week due to rash on face.

## 2018-02-24 NOTE — Unmapped (Signed)
GYNECOLOGIC ONCOLOGY RETURN VISIT        ASSESSMENT AND PLAN:  Tracy Trujillo 38 y.o. 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  She had persistent disease at the time of completion hysterectomy and USO and was treated with additional chemotherapy.  Most recently she had recurrent disease biopsies all confirm low-grade tumor.  She started with letrozole therapy at 2.5 mg/day.  However, today she has progressive disease and we discussed making a change to trametinib.  She was seen by her cardiologist  Dr. Antoine Poche (cardiologist-[phone]) and had EKG and echocardiogram.  She was started on trametinib (01/28/18)  but had a rash that was treated with both clindamycin as well as steroids.  This is been assisted under the direction of Hermelinda Medicus. At the time of her visit:  Grade 2 acneiform rash and grade 1 diarrhea both of which are new or worsened. Given the impact on quality of life will hold trametinib until resolution of acneiform rash. Once grade 1 will restart at reduced dose of 1.5 mg PO daily trametinib. This plan was discussed with Dr. Duard Schafer (primary oncologist). Patient will be re-evaluated at visit.   ?? HOLD trametinib 2 mg PO daily    However most recently she was having hemoptysis and the decision was made to come off the trametinib completely as this is a fairly significant toxicity.  She knows that I will reach out to some of my colleagues regarding other treatment.  While chemotherapy is not quite as effective and low-grade carcinoma as it is a high-grade carcinoma, in the absence of any other actionable target or medication that she is able to tolerate it might be our only option.  She would like to avoid colostomy if at all possible so she is willing to accept chemotherapy if it could even make her surgery feasible.  She knows I will call her in the next few weeks with a treatment plan. We do need to wait for resolution of all the symptoms from her trametinib.    After she left clinic today her magnesium was noted to be 0.8.  She was contacted and offered the opportunity to go to her local emergency room for magnesium supplement.  The patient was asymptomatic and refused to go to the emergency room.  She will come to clinic tomorrow for 4 g of IV magnesium.    >25 minutes face to face time as spent with the patient today of which >50% was in consultation and discussion.    CC:   No chief complaint on file.      HISTORY:  She??initially presented with ovarian cancer in 2016 and underwent laparotomy with RSO, staging. Pathology showed a high grde serous carcinoma with a background of serous borderline tumor with micropapillary features. She completed adjuvant tax/carb x6 then underwent completion hysterectomy and LSO 6 months after treatment. Given persistent disease, she was treated with 6 further cycles of tax/carb. Has been NED since then. Screening CTAP revealed??wall thickening and enhancing nodularity along the serosal surface of the sigmoid colon, worrisome for peritoneal disease.??PET findings concerning for recurrence (small deposits along the sigmoid colon).  ??  Preop Labs and Imaging:  ??  05/20/17 PET??IMPRESSION:  1. The small deposits along the sigmoid colon serosa are hypermetabolic, compatible with tumor implants. Maximum SUV 10.7. No  other metastatic spread identified.  2. Tonsillar and lymph node activity in the neck is  likely physiologic. No pathologically enlarged lymph nodes in the neck.  3. Other imaging findings of potential clinical significance: Chronic ethmoid and maxillary sinusitis. Nonobstructive left nephrolithiasis with left renal scarring. Aortic Atherosclerosis (ICD10-I70.0). Right paraumbilical hernia containing part of the transverse colon. Appendicolith. Small left periurethral cystic lesion.    Procedure on 06/04/17: Diagnostic laparoscopy, biopsy of pelvic tumor nodules??  ??  Operative Findings: Diagnostic laparoscopy, biopsy of pelvic tumor nodules??  ??  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     Stage: Recurrent low grade serous ovarian carcinoma  Plan:  Discussion of risks and benefits of treatment options including cytoreductive surgery vs chemotherapy vs hormonal therapy.    I had released the results regarding the ER status to the patient.  Her K-ras testing was negative.  After lengthy discussion of options, she wished to proceed with letrozole and was started on that.    Ct 8/19  IMPRESSION:  - Adjacent to the sigmoid colon and vaginal cuff, there are likely implants and focal areas of sigmoid wall thickening with small amount of adjacent inflammatory stranding. This raises concern for metastatic implant/disease recurrence.  - Additional chronic/incidental findings, as above.    Ca-125 8.9 on 10/19. She comes in today for follow up and has a CT scan today.    CT today shows:  IMPRESSION:  Since 09/11/2017  -Increased pelvic peritoneal nodularity, suspicious for worsening metastatic disease.   -Increased nodularity in the left upper quadrant, suspicious for metastatic disease.    INTERVAL HISTORY:  I last saw her in December when we discussed the progressive disease.  She was started on trametinib.  She started taking it on January 10.  She developed a grade 2 acneiform rash as well as grade 1 diarrhea.  The plan had been to hold her dose until she went down a grade 1 toxicity and then to resume that with amoxicillin seen in her local emergency room.  Her chest imaging including chest x-ray and CT were negative.  The hemoptysis is fairly significant though hemodynamically she is stable and her CBC is also stable.  I discussed with her that this toxicity would mean that she would need to come off the trametinib indefinitely.  Due to her rash she has been on antibiotics and feels like she has a yeast infection.  She would like me to send in Diflucan to her pharmacy.    Review of Systems   Constitutional: Denies fever, + weight gain.  Skin: + Rash  Cardiovascular: No chest pain, shortness of breath, or edema,  Pulmonary: _ hemoptysis as above  Gastro Intestinal: As above  Genitourinary: No frequency, urgency, or dysuria.  Denies vaginal bleeding and discharge.   Musculoskeletal: No pain.   Psychology: Some stress, more fatigue    Past Medical History:   Past Medical History:   Diagnosis Date   ??? ADHD (attention deficit hyperactivity disorder)    ??? Asthma 1996   ??? Hyperlipidemia    ??? Hypertension    ??? Hypertension    ??? Myocardial infarct (CMS-HCC)     drug eluding stent placement in LAD, 99% blockage per pt     Past Surgical History:   Past Surgical History:   Procedure Laterality Date   ??? CORONARY ANGIOPLASTY WITH STENT PLACEMENT      in LAD   ??? DENTAL SURGERY  2012   ??? PR DILATION/CURETTAGE,DIAGNOSTIC N/A  11/09/2014    Procedure: Dilation And Curettage, Diagnostic And/Or Therapeutic (Non Obstetrical);  Surgeon: Ermelinda Das, MD;  Location: MAIN OR [facility];  Service: Gynecology Oncology   ??? PR EXPLORATORY OF ABDOMEN N/A 06/04/2017    Procedure: Robotic Xi Exploratory Laparotomy, Exploratory Celiotomy With Or Without Biopsy(S);  Surgeon: Ermelinda Das, MD;  Location: MAIN OR [facility];  Service: Gynecology Oncology   ??? PR INSERT INTRAUTERINE DEVICE N/A 11/09/2014    Procedure: Insertion Of Intrauterine Device (Iud);  Surgeon: Ermelinda Das, MD;  Location: MAIN OR [facility];  Service: Gynecology Oncology   ??? PR LAPAROSCOPY W TOT HYSTERECTUTERUS <=250 GRAM  W TUBE/OVARY Bilateral 07/19/2015    Procedure: Robotic Laparoscopy, Surgical, With Total Hysterectomy, For Uterus 250 G Or Less; W/Removal Tube(S) And/Or Ovary(S);  Surgeon: Ermelinda Das, MD;  Location: MAIN OR [facility];  Service: Gynecology Oncology   ??? PR RELEASE URETER,RETROPER FIBROSIS Right 07/13/2014 Procedure: URETEROLYSIS, WITH OR WITHOUT REPOSITIONING OF URETER FOR RETROPERITONEAL FIBROSIS;  Surgeon: Thompson Caul, MD;  Location: MAIN OR [facility];  Service: Gynecology Oncology   ??? PR REMOVAL OF OMENTUM N/A 07/13/2014    Procedure: OMENTECTOMY, EPIPLOECTOMY, RESECTION OF OMENTUM;  Surgeon: Thompson Caul, MD;  Location: MAIN OR [facility];  Service: Gynecology Oncology   ??? PR REMOVAL OF OVARY/TUBE(S) Right 07/13/2014    Procedure: SALPINGO-OOPHORECTOMY, COMPLETE OR PARTIAL, UNILATERAL OR BILATERAL (SEPARATE PROCEDURE);  Surgeon: Thompson Caul, MD;  Location: MAIN OR [facility];  Service: Gynecology Oncology   ??? PR REMOVAL, PELVIC LYMPH NODES,STAGING Right 07/13/2014    Procedure: LIMITED LYMPHADENECTOMY FOR STAGING (SEPARATE PROCEDURE); PELVIC AND PARA-AORTIC;  Surgeon: Thompson Caul, MD;  Location: MA[facility] OR [facility];  Service: Gynecology Oncology   ??? REDUCTION MAMMAPLASTY  2001     Oncology History:      Malignant neoplasm of right ovary (CMS-HCC)    07/13/2014 Initial Diagnosis     Malignant neoplasm of right ovary (RAF-HCC)      07/13/2014 Surgery     Exploratory laparotomy with right salpingoophorectomy, right ureterolysis, infracolic omentectomy, right pelvic and aortic lymph node dissection, and staging biopsies      08/10/2014 - 12/10/2014 Chemotherapy     s/p 6 cycles of paclitaxel and carboplatin      07/19/2015 Surgery     robotic hysterectomy, LSO      07/19/2015 Recurrence      tumor on left ovary and uterine serosa      08/20/2015 Genetics     Patient has genetic testing done for ovarian cancer  Results revealed patient has the following mutation(s):  None, ER + tumor, KRAS -      11/2016 No evidence of disease         04/2017 Recurrence         06/04/2017 Surgery     + recurrence of LOW grade carcinoma      07/05/2017 Endocrine/Hormone Therapy     Letrozole started 2.5 mg daily      01/03/2018 Progression     Letrozole stopped and will move forward with tremetinib once cardiology evaluation is completed.       Family History: Family History   Problem Relation Age of Onset   ??? Prostate cancer Other         grandfather   ??? Anesthesia problems Neg Hx      Social History:   Social History     Socioeconomic History   ??? Marital status: Single     Spouse name: Not on  file   ??? Number of children: Not on file   ??? Years of education: Not on file   ??? Highest education level: Not on file   Occupational History   ??? Not on file   Social Needs   ??? Financial resource strain: Not on file   ??? Food insecurity     Worry: Not on file     Inability: Not on file   ??? Transportation needs     Medical: Not on file     Non-medical: Not on file   Tobacco Use   ??? Smoking status: Former Smoker     Last attempt to quit: 02/13/2011     Years since quitting: 7.0   ??? Smokeless tobacco: Never Used   Substance and Sexual Activity   ??? Alcohol use: Yes     Alcohol/week: 0.0 standard drinks     Comment: socially   ??? Drug use: No   ??? Sexual activity: Not on file   Lifestyle   ??? Physical activity     Days per week: Not on file     Minutes per session: Not on file   ??? Stress: Not on file   Relationships   ??? Social Wellsite geologist on phone: Not on file     Gets together: Not on file     Attends religious service: Not on file     Active member of club or organization: Not on file     Attends meetings of clubs or organizations: Not on file     Relationship status: Not on file   Other Topics Concern   ??? Not on file   Social History Narrative    Pt is single, works as an Wellsite geologist     Allergy:   Allergies   Allergen Reactions   ??? Lisinopril      Other reaction(s): Cough       CURRENT MEDICATIONS:  Outpatient Encounter Medications as of 02/28/2018   Medication Sig Dispense Refill   ??? albuterol HFA 90 mcg/actuation inhaler Inhale 2 puffs.     ??? ALPRAZolam (XANAX) 1 MG tablet Take 1 mg by mouth Three (3) times a day as needed for sleep.     ??? aspirin (ECOTRIN) 81 MG tablet Take 81 mg by mouth.     ??? atorvastatin (LIPITOR) 40 MG tablet TAKE 1 TABLET(40 MG) BY MOUTH EVERY NIGHT ??? brexpiprazole (REXULTI) 1 mg Tab Take 2 mg by mouth daily.     ??? cetirizine (ZYRTEC) 10 MG tablet Take 10 mg by mouth.     ??? clindamycin (CLEOCIN T) 1 % external solution Apply to affected area 2 times daily 60 mL 2   ??? desvenlafaxine succinate (PRISTIQ) 100 MG 24 hr tablet Take 100 mg by mouth.     ??? dextroamphetamine-amphetamine 30 mg Tab Take 60 mg by mouth every morning.     ??? doxycycline (MONODOX) 100 MG capsule Take 1 capsule (100 mg total) by mouth Two (2) times a day. 60 capsule 2   ??? fluconazole (DIFLUCAN) 150 MG tablet Take 1 tablet (150 mg total) by mouth once for 1 dose. 2 tablet 2   ??? losartan (COZAAR) 50 MG tablet TAKE 1 TABLET(50 MG) BY MOUTH TWICE DAILY     ??? magnesium oxide (MAGOX) 400 mg (241.3 mg magnesium) tablet Take 1 tablet (400 mg total) by mouth Three (3) times a day. 270 tablet 1   ??? metoprolol succinate (TOPROL-XL) 100 MG 24  hr tablet Take 100 mg by mouth daily at 0600.     ??? multivitamin (THERAGRAN) per tablet Take 1 tablet by mouth daily.     ??? nitroglycerin (NITROSTAT) 0.4 MG SL tablet Place 0.4 mg under the tongue.     ??? prasugrel (EFFIENT) 10 mg tablet Take 10 mg by mouth daily at 0600.     ??? trametinib 0.5 mg Tab Take 3 tablets (1.5 mg) by mouth daily. 90 tablet 5     No facility-administered encounter medications on file as of 02/28/2018.        PHYSICAL EXAM:  Please refer to epic for vital signs.  General: Alert, oriented, no acute distress.    Neck: Supple, no lymphadenopathy, no thyromegaly.    Lungs: Clear to auscultation bilaterally.    Cardiac: Regular rate and rhythm.    Abdomen: Obese, soft, nontender.  The bowel sounds are normoactive. No masses or hepatosplenomegaly appreciated.  Well-healed surgical incisions with no evidence of any incisional hernias.    Groins: No lymphadenopathy.    Extremities: No edema.    Pelvic: External genitalia within normal limits.  Bimanual examination is unremarkable. I cannot feel the nodule that I was able to on prior exams. RV exam confirms.    LABORATORY AND RADIOLOGIC STUDIES:    CA-125, CBC, Chemistries

## 2018-02-24 NOTE — Unmapped (Signed)
Specialty Medication Follow-up    Tracy Trujillo is a 38 y.o. female with recurrent ovarian cancer who I am seeing for follow up on their treatment with trametinib.     Chemotherapy: Trametinib 2 mg PO daily  Start date: 01/28/18    A/P:   1. Oral Chemotherapy: CBC w/diff and CMP reviewed.Grade 2 acneiform rash and grade 1 diarrhea both of which are new or worsened. Given the impact on quality of life will hold trametinib until resolution of acneiform rash. Once grade 1 will restart at reduced dose of 1.5 mg PO daily trametinib. This plan was discussed with Dr. Duard Monaco (primary oncologist). Patient will be re-evaluated at visit.   ?? HOLD trametinib 2 mg PO daily  ?? Obtain CBC w/diff and CMP in 2 weeks    I spent approximately 15 minutes in direct patient care.    Next follow up: In 2 weeks at clinic appointment    Referring physician: Dr. Para March, PharmD, BCOP, CPP  Gynecologic Oncology Clinic Pharmacist  Pager: [phone]    S/O: Tracy Trujillo was contacted regarding recent lab results. She reports that the rash on her face continues to worsen despite topical clindamycin and oral doxycycline. She endorses that the sores on her face are painful and bleeding. She states that it is significantly impacting her quality of life. She has had a few episodes of loose bowel movements but this is likely secondary to magnesium supplementation which is new.     Medications reviewed and updated in EPIC? no    Missed doses: None    Labs (02/17/18)  WBC 9.2 x10*9/L   Hgb 13.2 g/dL   Plt 660 Y30*1/S   ANC 6.5 x10*9/L   SCr 0.76 mg/dL   TBili 0.5 mg/dL   AST 24 U/L   ALT 26 U/L   Alk Phos 125 U/L

## 2018-02-28 ENCOUNTER — Ambulatory Visit
Admit: 2018-02-28 | Discharge: 2018-02-28 | Payer: BLUE CROSS/BLUE SHIELD | Attending: Gynecologic Oncology | Primary: Gynecologic Oncology

## 2018-02-28 ENCOUNTER — Encounter
Admit: 2018-02-28 | Discharge: 2018-02-28 | Payer: BLUE CROSS/BLUE SHIELD | Attending: Nurse Practitioner | Primary: Nurse Practitioner

## 2018-02-28 DIAGNOSIS — C561 Malignant neoplasm of right ovary: Principal | ICD-10-CM

## 2018-02-28 DIAGNOSIS — Z5111 Encounter for antineoplastic chemotherapy: Secondary | ICD-10-CM

## 2018-02-28 LAB — CBC W/ AUTO DIFF
BASOPHILS ABSOLUTE COUNT: 0 10*9/L (ref 0.0–0.1)
BASOPHILS RELATIVE PERCENT: 0.3 %
EOSINOPHILS ABSOLUTE COUNT: 0.2 10*9/L (ref 0.0–0.4)
EOSINOPHILS RELATIVE PERCENT: 2.4 %
HEMOGLOBIN: 12.5 g/dL (ref 12.0–16.0)
LYMPHOCYTES RELATIVE PERCENT: 23.6 %
MEAN CORPUSCULAR HEMOGLOBIN CONC: 33.6 g/dL (ref 31.0–37.0)
MEAN CORPUSCULAR HEMOGLOBIN: 31.3 pg (ref 26.0–34.0)
MEAN CORPUSCULAR VOLUME: 93.2 fL (ref 80.0–100.0)
MEAN PLATELET VOLUME: 7.2 fL (ref 7.0–10.0)
MONOCYTES ABSOLUTE COUNT: 0.4 10*9/L (ref 0.2–0.8)
MONOCYTES RELATIVE PERCENT: 5.7 %
NEUTROPHILS ABSOLUTE COUNT: 4.3 10*9/L (ref 2.0–7.5)
NEUTROPHILS RELATIVE PERCENT: 63.9 %
PLATELET COUNT: 281 10*9/L (ref 150–440)
RED BLOOD CELL COUNT: 3.98 10*12/L — ABNORMAL LOW (ref 4.00–5.20)
RED CELL DISTRIBUTION WIDTH: 14 % (ref 12.0–15.0)
WBC ADJUSTED: 6.7 10*9/L (ref 4.5–11.0)

## 2018-02-28 LAB — MAGNESIUM: Magnesium:MCnc:Pt:Ser/Plas:Qn:: 0.9 — CL

## 2018-02-28 LAB — COMPREHENSIVE METABOLIC PANEL
ALKALINE PHOSPHATASE: 118 U/L (ref 38–126)
ALT (SGPT): 24 U/L (ref <35)
ANION GAP: 18 mmol/L — ABNORMAL HIGH (ref 7–15)
AST (SGOT): 31 U/L (ref 14–38)
BILIRUBIN TOTAL: 0.3 mg/dL (ref 0.0–1.2)
BLOOD UREA NITROGEN: 14 mg/dL (ref 7–21)
BUN / CREAT RATIO: 25
CALCIUM: 8.1 mg/dL — ABNORMAL LOW (ref 8.5–10.2)
CHLORIDE: 102 mmol/L (ref 98–107)
CO2: 20 mmol/L — ABNORMAL LOW (ref 22.0–30.0)
CREATININE: 0.57 mg/dL — ABNORMAL LOW (ref 0.60–1.00)
EGFR CKD-EPI AA FEMALE: >90 mL/min/{1.73_m2} (ref >=60)
EGFR CKD-EPI NON-AA FEMALE: >90 mL/min/{1.73_m2} (ref >=60)
GLUCOSE RANDOM: 104 mg/dL (ref 70–179)
POTASSIUM: 3.7 mmol/L (ref 3.5–5.0)
PROTEIN TOTAL: 7.3 g/dL (ref 6.5–8.3)
SODIUM: 140 mmol/L (ref 135–145)

## 2018-02-28 LAB — BLOOD UREA NITROGEN: Urea nitrogen:MCnc:Pt:Ser/Plas:Qn:: 14

## 2018-02-28 LAB — RED CELL DISTRIBUTION WIDTH: Lab: 14

## 2018-02-28 LAB — CA 125: Cancer Ag 125:ACnc:Pt:Ser/Plas:Qn:: 7.3

## 2018-02-28 MED ORDER — FLUCONAZOLE 150 MG TABLET
ORAL_TABLET | Freq: Once | ORAL | 2 refills | 0.00000 days | Status: CP
Start: 2018-02-28 — End: 2018-02-28

## 2018-02-28 NOTE — Unmapped (Signed)
Telephone Call: Hemoptysis    A/P  1. Hemoptysis: New onset coughing up blood. She is not currently on trametinib therapy but there is a risk of hemorrhage with trametinib. Additionally, the patient is on aspirin and prasugrel for cardiac history. Recommend that patient report to local ED for further evaluation.  ?? Report to local ED    S/O: Tracy Trujillo is a 38 y.o. female with ovarian cancer currently receiving trametinib therapy. Ms. Kehres called to report new onset coughing up blood. She has never experienced this before. She reports feeling ok but didn't know what to do when coughing up blood. She endorses that it is not a small amount of blood.     I spent approximately 5 minutes in patient care activities.    Referring physician: Dr. Para March, PharmD, BCOP, CPP  Gynecologic Oncology Clinic Pharmacist  Pager: [phone]

## 2018-02-28 NOTE — Unmapped (Signed)
TC to Tracy Trujillo to report abnormal. Mag leve of 0.9. She reports not taking the magnesium QID per recommendation. Last dose this morning at 6am.   Patient denies muscle cramping, heart palpitations, or weakness.      Recommend patient go to ER for replacement mag, per Dr. Duard Sensabaugh. Pt refused to go to the ER,  but will come back to clinic tomorrow for replacement.     Appointment scheduled.

## 2018-02-28 NOTE — Unmapped (Addendum)
We will follow-up on your potassium and your magnesium from today.  I sent the Diflucan in for your pharmacy.  I will call you in a couple days with some ideas about treatment.Patient Education        fluconazole  Pronunciation:  floo KOE na zole  Brand:  Diflucan  What is the most important information I should know about fluconazole?  Tell your doctor about all your current medicines and any you start or stop using. Many drugs can interact, and some drugs should not be used together.  What is fluconazole?  Fluconazole is an antifungal medicine.  Fluconazole is used to treat infections caused by fungus, which can invade any part of the body including the mouth, throat, esophagus, lungs, bladder, genital area, and the blood.  Fluconazole is also used to prevent fungal infection in people who have a weak immune system caused by cancer treatment, bone marrow transplant, or diseases such as AIDS.  Fluconazole may also be used for purposes not listed in this medication guide.  What should I discuss with my healthcare provider before taking fluconazole?  You should not use fluconazole if you are allergic to it.  Some medicines can cause unwanted or dangerous effects when used with fluconazole. Your doctor may change your treatment plan if you also use:  ?? an antibiotic, antifungal, or antiviral medicine;  ?? a blood thinner;  ?? cancer medicine;  ?? cholesterol medication;  ?? oral diabetes medicine;  ?? heart or blood pressure medication;  ?? medicine for malaria or tuberculosis;  ?? medicine to prevent organ transplant rejection;  ?? medicine to treat depression or mental illness;  ?? an NSAID (nonsteroidal anti-inflammatory drug);  ?? seizure medicine; or  ?? steroid medicine.  Tell your doctor if you have ever had:  ?? liver disease;  ?? HIV or AIDS;  ?? cancer;  ?? heart disease or heart rhythm disorder;  ?? long QT syndrome (in you or a family member);  ?? kidney disease; or  ?? if you are allergic to other antifungal medicine (such as ketoconazole, itraconazole, miconazole, posaconazole, voriconazole, and others).  The liquid form of fluconazole contains sucrose. Talk to your doctor before using this form of fluconazole if you have a problem digesting sugars or milk.  Fluconazole may harm an unborn baby. Use effective birth control to prevent pregnancy while you are using this medicine, and tell your doctor if you become pregnant.  It may not be safe to breast-feed a baby while you are using this medicine. Ask your doctor about any risks.  How should I take fluconazole?  Follow all directions on your prescription label and read all medication guides or instruction sheets. Use the medicine exactly as directed.  Your dose will depend on the infection you are treating. Vaginal infections are often treated with only one pill. For other infections, your first dose may be a double dose. Carefully follow your doctor's instructions.  You may take fluconazole with or without food.  Shake the oral suspension (liquid) before you measure a dose. Use the dosing syringe provided, or use a medicine dose-measuring device (not a kitchen spoon).  Use this medicine for the full prescribed length of time, even if your symptoms quickly improve. Skipping doses can increase your risk of infection that is resistant to medication. Fluconazole will not treat a viral infection such as the flu or a common cold.  Call your doctor if your symptoms do not improve, or if they get worse.  Store  the tablets at room temperature away from moisture and heat.  You may store liquid fluconazole in a refrigerator, but do not allow it to freeze. Throw away any leftover liquid that is more than 55 weeks old.  What happens if I miss a dose?  Take the medicine as soon as you can, but skip the missed dose if it is almost time for your next dose. Do not take two doses at one time.  What happens if I overdose?  Seek emergency medical attention or call the Poison Help line at [phone]. Overdose symptoms may include confusion or unusual thoughts or behavior.  What should I avoid while taking fluconazole?  Follow your doctor's instructions about any restrictions on food, beverages, or activity.  Avoid driving or hazardous activity until you know how this medicine will affect you. Your reactions could be impaired.  What are the possible side effects of fluconazole?  Get emergency medical help if you have signs of an allergic reaction (hives, difficult breathing, swelling in your face or throat) or a severe skin reaction (fever, sore throat, burning eyes, skin pain, red or purple skin rash with blistering and peeling).  Call your doctor at once if you have:  ?? fast or pounding heartbeats, fluttering in your chest, shortness of breath, and sudden dizziness (like you might pass out);  ?? fever, chills, body aches, flu symptoms;  ?? easy bruising or bleeding, unusual weakness;  ?? seizure (convulsions);  ?? skin rash or skin lesions; or  ?? liver problems --loss of appetite, stomach pain (upper right side), dark urine, clay-colored stools, jaundice (yellowing of the skin or eyes).  Common side effects may include:  ?? stomach pain, diarrhea, upset stomach;  ?? headache;  ?? dizziness; or  ?? changes in your sense of taste.  This is not a complete list of side effects and others may occur. Call your doctor for medical advice about side effects. You may report side effects to FDA at 1-800-FDA-1088.  What other drugs will affect fluconazole?  Sometimes it is not safe to use certain medications at the same time. Some drugs can affect your blood levels of other drugs you take, which may increase side effects or make the medications less effective.  Many drugs can interact with fluconazole, and some drugs should not be used together. This includes prescription and over-the-counter medicines, vitamins, and herbal products. Not all possible interactions are listed in this medication guide.  Tell your doctor about all your current medicines and any medicine you start or stop using.  Where can I get more information?  Your pharmacist can provide more information about fluconazole.  Remember, keep this and all other medicines out of the reach of children, never share your medicines with others, and use this medication only for the indication prescribed.  Every effort has been made to ensure that the information provided by [employer] ('[employer]') is accurate, up-to-date, and complete, but no guarantee is made to that effect. Drug information contained herein may be time sensitive. [employer] information has been compiled for use by healthcare practitioners and consumers in the [place] and therefore [employer] does not warrant that uses outside of the [place] are appropriate, unless specifically indicated otherwise. [employer]'s drug information does not endorse drugs, diagnose patients or recommend therapy. [employer]'s drug information is an Armed forces technical officer designed to assist licensed healthcare practitioners in caring for their patients and/or to serve consumers viewing this service as a supplement to, and not a substitute for,  the expertise, skill, knowledge and judgment of healthcare practitioners. The absence of a warning for a given drug or drug combination in no way should be construed to indicate that the drug or drug combination is safe, effective or appropriate for any given patient. [employer] does not assume any responsibility for any aspect of healthcare administered with the aid of information [employer] provides. The information contained herein is not intended to cover all possible uses, directions, precautions, warnings, drug interactions, allergic reactions, or adverse effects. If you have questions about the drugs you are taking, check with your doctor, nurse or pharmacist.  Copyright [phone]) 384-5383 [employer] Version: 10.01. Revision date: 05/01/2016.  Care instructions adapted under license by [facility] If you have questions about a medical condition or this instruction, always ask your healthcare professional. [employer] disclaims any warranty or liability for your use of this information.

## 2018-03-01 ENCOUNTER — Encounter: Admit: 2018-03-01 | Discharge: 2018-03-02 | Payer: BLUE CROSS/BLUE SHIELD

## 2018-03-01 DIAGNOSIS — C561 Malignant neoplasm of right ovary: Secondary | ICD-10-CM

## 2018-03-01 NOTE — Unmapped (Signed)
Patient presents for magnesium replacement infusion. Pt had labs drawn yesterday and Mg 0.9. Right chest port accessed and 2 gms magnesium sulfate infusion begun.  Pt denies any muscle cramping or heart palpitations. B.Elaynah Virginia RN  Infusion tolerated well, pt given printed information on magnesium rich foods. B.Priscilla Kirstein RN

## 2018-03-03 ENCOUNTER — Other Ambulatory Visit: Payer: Self-pay | Admitting: Gynecologic Oncology

## 2018-03-03 ENCOUNTER — Inpatient Hospital Stay: Payer: BC Managed Care – PPO | Attending: Gynecologic Oncology

## 2018-03-03 ENCOUNTER — Other Ambulatory Visit: Payer: BC Managed Care – PPO

## 2018-03-03 ENCOUNTER — Inpatient Hospital Stay: Payer: BC Managed Care – PPO

## 2018-03-03 ENCOUNTER — Telehealth: Payer: Self-pay | Admitting: *Deleted

## 2018-03-03 DIAGNOSIS — B3731 Acute candidiasis of vulva and vagina: Secondary | ICD-10-CM

## 2018-03-03 DIAGNOSIS — B373 Candidiasis of vulva and vagina: Secondary | ICD-10-CM

## 2018-03-03 DIAGNOSIS — C569 Malignant neoplasm of unspecified ovary: Secondary | ICD-10-CM | POA: Insufficient documentation

## 2018-03-03 MED ORDER — FLUCONAZOLE 150 MG PO TABS: 150.0000 mg | ORAL_TABLET | Freq: Once | ORAL | 0 refills | Status: AC

## 2018-03-03 NOTE — Unmapped (Signed)
Addended by: Crecencio Mc on: 03/03/2018 12:11 PM     Modules accepted: Orders

## 2018-03-03 NOTE — Unmapped (Signed)
Specialty Medication Follow-up    Tracy Trujillo is a 38 y.o. female with recurrent ovarian cancer who I am seeing for follow up on their treatment with trametinib.     Chemotherapy: Trametinib 2 mg PO daily  Start date: 01/28/18    A/P:   1. Oral Chemotherapy: Patient is experiencing ongoing hemoptysis. Other causes of hemoptysis were ruled out therefore would assume it is an adverse effect from trametinib therapy. Given the presence of this toxicity will discontinue trametinib and pursue other treatment options.   ?? DISCONTINUE trametinib 2 mg PO daily    I spent approximately 15 minutes in direct patient care.    Next follow up: As needed given that she is discontinuing oral chemotherapy.     Referring physician: Dr. Para March, PharmD, BCOP, CPP  Gynecologic Oncology Clinic Pharmacist  Pager: [phone]

## 2018-03-03 NOTE — Progress Notes (Signed)
See call with J. Wheat.  Refill given.

## 2018-03-03 NOTE — Telephone Encounter (Signed)
Returned the patient's call, she stated that "Dr. Alycia Rossetti gave me diflucan last week for a yeast infection due to antibotics. It has helped, but didn't clear it 100%. I wanted to see if [facility] APP would sen me in a refill?" Message forwarded to [facility] APP

## 2018-03-03 NOTE — Telephone Encounter (Signed)
Called patient, let her know that [place] APP sent in the prescription for her

## 2018-03-07 ENCOUNTER — Telehealth: Payer: Self-pay | Admitting: *Deleted

## 2018-03-07 NOTE — Telephone Encounter (Signed)
Returned the patient's call and scheduled her lab appt on Wednesday 2/19 at 3:45pm.

## 2018-03-09 ENCOUNTER — Telehealth: Payer: Self-pay | Admitting: *Deleted

## 2018-03-09 ENCOUNTER — Other Ambulatory Visit: Payer: BC Managed Care – PPO

## 2018-03-09 ENCOUNTER — Inpatient Hospital Stay: Payer: BC Managed Care – PPO

## 2018-03-09 ENCOUNTER — Telehealth: Payer: Self-pay

## 2018-03-09 DIAGNOSIS — C569 Malignant neoplasm of unspecified ovary: Secondary | ICD-10-CM | POA: Diagnosis not present

## 2018-03-09 LAB — CBC WITH DIFFERENTIAL (CANCER CENTER ONLY)
Abs Immature Granulocytes: 0.04 10*3/uL (ref 0.00–0.07)
Basophils Absolute: 0 10*3/uL (ref 0.0–0.1)
Basophils Relative: 0 %
EOS PCT: 2 %
Eosinophils Absolute: 0.2 10*3/uL (ref 0.0–0.5)
HCT: 38.9 % (ref 36.0–46.0)
Hemoglobin: 12.8 g/dL (ref 12.0–15.0)
Immature Granulocytes: 0 %
LYMPHS PCT: 19 %
Lymphs Abs: 1.8 10*3/uL (ref 0.7–4.0)
MCH: 31 pg (ref 26.0–34.0)
MCHC: 32.9 g/dL (ref 30.0–36.0)
MCV: 94.2 fL (ref 80.0–100.0)
Monocytes Absolute: 0.7 10*3/uL (ref 0.1–1.0)
Monocytes Relative: 8 %
Neutro Abs: 6.6 10*3/uL (ref 1.7–7.7)
Neutrophils Relative %: 71 %
Platelet Count: 249 10*3/uL (ref 150–400)
RBC: 4.13 MIL/uL (ref 3.87–5.11)
RDW: 13.2 % (ref 11.5–15.5)
WBC: 9.4 10*3/uL (ref 4.0–10.5)
nRBC: 0 % (ref 0.0–0.2)

## 2018-03-09 LAB — COMPREHENSIVE METABOLIC PANEL
ALT: 18 U/L (ref 0–44)
AST: 17 U/L (ref 15–41)
Albumin: 4.1 g/dL (ref 3.5–5.0)
Alkaline Phosphatase: 116 U/L (ref 38–126)
Anion gap: 13 (ref 5–15)
BUN: 19 mg/dL (ref 6–20)
CO2: 25 mmol/L (ref 22–32)
Calcium: 9.1 mg/dL (ref 8.9–10.3)
Chloride: 104 mmol/L (ref 98–111)
Creatinine, Ser: 0.87 mg/dL (ref 0.44–1.00)
GFR calc Af Amer: >60 mL/min (ref >60)
Glucose, Bld: 114 mg/dL — ABNORMAL HIGH (ref 70–99)
Potassium: 3.8 mmol/L (ref 3.5–5.1)
Sodium: 142 mmol/L (ref 135–145)
Total Bilirubin: 0.5 mg/dL (ref 0.3–1.2)
Total Protein: 7.8 g/dL (ref 6.5–8.1)

## 2018-03-09 LAB — MAGNESIUM: MAGNESIUM: 1.4 mg/dL — AB (ref 1.7–2.4)

## 2018-03-09 MED ORDER — PROCHLORPERAZINE MALEATE 10 MG TABLET
ORAL_TABLET | Freq: Four times a day (QID) | ORAL | 2 refills | 0 days | Status: CP | PRN
Start: 2018-03-09 — End: ?

## 2018-03-09 MED ORDER — ONDANSETRON HCL 8 MG TABLET
ORAL_TABLET | Freq: Three times a day (TID) | ORAL | 2 refills | 0.00000 days | Status: CP | PRN
Start: 2018-03-09 — End: ?

## 2018-03-09 NOTE — Telephone Encounter (Signed)
Incoming call from [facility] lab with panic magnesium level of 1.43 - notified Joylene John NP.

## 2018-03-09 NOTE — Telephone Encounter (Signed)
Fax labs to [place] [facility]

## 2018-03-10 ENCOUNTER — Ambulatory Visit: Admit: 2018-03-10 | Discharge: 2018-03-11 | Payer: BLUE CROSS/BLUE SHIELD

## 2018-03-10 DIAGNOSIS — C561 Malignant neoplasm of right ovary: Principal | ICD-10-CM

## 2018-03-10 DIAGNOSIS — Z5111 Encounter for antineoplastic chemotherapy: Secondary | ICD-10-CM

## 2018-03-16 ENCOUNTER — Telehealth: Payer: Self-pay | Admitting: *Deleted

## 2018-03-16 NOTE — Telephone Encounter (Signed)
Scheduled the patient for biweekly labs times 9. Patient to receive appts through my chart

## 2018-03-17 ENCOUNTER — Inpatient Hospital Stay: Payer: BC Managed Care – PPO

## 2018-03-17 ENCOUNTER — Telehealth: Payer: Self-pay

## 2018-03-17 DIAGNOSIS — C569 Malignant neoplasm of unspecified ovary: Secondary | ICD-10-CM | POA: Diagnosis not present

## 2018-03-17 LAB — COMPREHENSIVE METABOLIC PANEL
ALT: 24 U/L (ref 0–44)
AST: 17 U/L (ref 15–41)
Albumin: 4 g/dL (ref 3.5–5.0)
Alkaline Phosphatase: 105 U/L (ref 38–126)
Anion gap: 13 (ref 5–15)
BILIRUBIN TOTAL: 0.3 mg/dL (ref 0.3–1.2)
BUN: 17 mg/dL (ref 6–20)
CO2: 23 mmol/L (ref 22–32)
CREATININE: 0.71 mg/dL (ref 0.44–1.00)
Calcium: 8.9 mg/dL (ref 8.9–10.3)
Chloride: 106 mmol/L (ref 98–111)
GFR calc Af Amer: >60 mL/min (ref >60)
GFR calc non Af Amer: >60 mL/min (ref >60)
Glucose, Bld: 107 mg/dL — ABNORMAL HIGH (ref 70–99)
Potassium: 3.5 mmol/L (ref 3.5–5.1)
Sodium: 142 mmol/L (ref 135–145)
TOTAL PROTEIN: 7.4 g/dL (ref 6.5–8.1)

## 2018-03-17 LAB — CBC WITH DIFFERENTIAL (CANCER CENTER ONLY)
Abs Immature Granulocytes: 0.01 10*3/uL (ref 0.00–0.07)
BASOS PCT: 0 %
Basophils Absolute: 0 10*3/uL (ref 0.0–0.1)
EOS PCT: 4 %
Eosinophils Absolute: 0.1 10*3/uL (ref 0.0–0.5)
HCT: 35.7 % — ABNORMAL LOW (ref 36.0–46.0)
Hemoglobin: 11.9 g/dL — ABNORMAL LOW (ref 12.0–15.0)
Immature Granulocytes: 0 %
Lymphocytes Relative: 36 %
Lymphs Abs: 1.3 10*3/uL (ref 0.7–4.0)
MCH: 30.9 pg (ref 26.0–34.0)
MCHC: 33.3 g/dL (ref 30.0–36.0)
MCV: 92.7 fL (ref 80.0–100.0)
Monocytes Absolute: 0.1 10*3/uL (ref 0.1–1.0)
Monocytes Relative: 3 %
Neutro Abs: 2.1 10*3/uL (ref 1.7–7.7)
Neutrophils Relative %: 57 %
Platelet Count: 178 10*3/uL (ref 150–400)
RBC: 3.85 MIL/uL — ABNORMAL LOW (ref 3.87–5.11)
RDW: 12.7 % (ref 11.5–15.5)
WBC Count: 3.6 10*3/uL — ABNORMAL LOW (ref 4.0–10.5)
nRBC: 0 % (ref 0.0–0.2)

## 2018-03-17 LAB — MAGNESIUM: Magnesium: 1.3 mg/dL — CL (ref 1.7–2.4)

## 2018-03-17 NOTE — Telephone Encounter (Signed)
Incoming call from lab regarding panic level Magnesium 1.3 - notified Joylene John NP at this time.

## 2018-03-18 ENCOUNTER — Telehealth: Payer: Self-pay | Admitting: *Deleted

## 2018-03-18 NOTE — Telephone Encounter (Signed)
Fax lab results to [facility]  

## 2018-03-30 ENCOUNTER — Other Ambulatory Visit: Payer: Self-pay

## 2018-03-30 ENCOUNTER — Telehealth: Payer: Self-pay | Admitting: *Deleted

## 2018-03-30 ENCOUNTER — Inpatient Hospital Stay: Payer: BC Managed Care – PPO | Attending: Gynecologic Oncology

## 2018-03-30 DIAGNOSIS — C569 Malignant neoplasm of unspecified ovary: Secondary | ICD-10-CM | POA: Diagnosis not present

## 2018-03-30 DIAGNOSIS — E876 Hypokalemia: Principal | ICD-10-CM

## 2018-03-30 LAB — CBC WITH DIFFERENTIAL (CANCER CENTER ONLY)
Abs Immature Granulocytes: 0.03 10*3/uL (ref 0.00–0.07)
BASOS ABS: 0 10*3/uL (ref 0.0–0.1)
Basophils Relative: 0 %
Eosinophils Absolute: 0 10*3/uL (ref 0.0–0.5)
Eosinophils Relative: 1 %
HCT: 33 % — ABNORMAL LOW (ref 36.0–46.0)
Hemoglobin: 11.2 g/dL — ABNORMAL LOW (ref 12.0–15.0)
Immature Granulocytes: 1 %
Lymphocytes Relative: 22 %
Lymphs Abs: 1.1 10*3/uL (ref 0.7–4.0)
MCH: 31.7 pg (ref 26.0–34.0)
MCHC: 33.9 g/dL (ref 30.0–36.0)
MCV: 93.5 fL (ref 80.0–100.0)
Monocytes Absolute: 0.4 10*3/uL (ref 0.1–1.0)
Monocytes Relative: 7 %
NEUTROS ABS: 3.5 10*3/uL (ref 1.7–7.7)
Neutrophils Relative %: 69 %
Platelet Count: 129 10*3/uL — ABNORMAL LOW (ref 150–400)
RBC: 3.53 MIL/uL — ABNORMAL LOW (ref 3.87–5.11)
RDW: 14.2 % (ref 11.5–15.5)
WBC Count: 5 10*3/uL (ref 4.0–10.5)
nRBC: 0 % (ref 0.0–0.2)

## 2018-03-30 LAB — MAGNESIUM: Magnesium: 0.8 mg/dL — CL (ref 1.7–2.4)

## 2018-03-30 LAB — COMPREHENSIVE METABOLIC PANEL
ALBUMIN: 3.9 g/dL (ref 3.5–5.0)
ALT: 25 U/L (ref 0–44)
AST: 25 U/L (ref 15–41)
Alkaline Phosphatase: 133 U/L — ABNORMAL HIGH (ref 38–126)
Anion gap: 15 (ref 5–15)
BUN: 9 mg/dL (ref 6–20)
CO2: 23 mmol/L (ref 22–32)
Calcium: 8.2 mg/dL — ABNORMAL LOW (ref 8.9–10.3)
Chloride: 102 mmol/L (ref 98–111)
Creatinine, Ser: 0.74 mg/dL (ref 0.44–1.00)
GFR calc Af Amer: >60 mL/min (ref >60)
GFR calc non Af Amer: >60 mL/min (ref >60)
Glucose, Bld: 107 mg/dL — ABNORMAL HIGH (ref 70–99)
Potassium: 3 mmol/L — CL (ref 3.5–5.1)
Sodium: 140 mmol/L (ref 135–145)
Total Bilirubin: 0.6 mg/dL (ref 0.3–1.2)
Total Protein: 7.5 g/dL (ref 6.5–8.1)

## 2018-03-30 MED ORDER — POTASSIUM CHLORIDE ER 10 MEQ TABLET, EXTENDED RELEASE WRAPPER
ORAL_TABLET | Freq: Two times a day (BID) | ORAL | 3 refills | 0 days | Status: CP
Start: 2018-03-30 — End: 2019-03-30

## 2018-03-30 NOTE — Telephone Encounter (Signed)
Fax lab results to [facility]  

## 2018-03-31 ENCOUNTER — Other Ambulatory Visit (HOSPITAL_COMMUNITY): Payer: BC Managed Care – PPO

## 2018-03-31 ENCOUNTER — Encounter: Admit: 2018-03-31 | Discharge: 2018-04-01 | Payer: BLUE CROSS/BLUE SHIELD

## 2018-03-31 DIAGNOSIS — E876 Hypokalemia: Principal | ICD-10-CM

## 2018-03-31 DIAGNOSIS — Z5111 Encounter for antineoplastic chemotherapy: Principal | ICD-10-CM

## 2018-03-31 DIAGNOSIS — C561 Malignant neoplasm of right ovary: Principal | ICD-10-CM

## 2018-04-04 ENCOUNTER — Encounter: Admit: 2018-04-04 | Discharge: 2018-04-05 | Payer: BLUE CROSS/BLUE SHIELD

## 2018-04-04 DIAGNOSIS — C561 Malignant neoplasm of right ovary: Principal | ICD-10-CM

## 2018-04-04 DIAGNOSIS — Z5111 Encounter for antineoplastic chemotherapy: Principal | ICD-10-CM

## 2018-04-07 ENCOUNTER — Other Ambulatory Visit: Payer: Self-pay | Admitting: Hematology and Oncology

## 2018-04-07 ENCOUNTER — Encounter: Payer: Self-pay | Admitting: Hematology and Oncology

## 2018-04-13 ENCOUNTER — Inpatient Hospital Stay: Payer: BC Managed Care – PPO

## 2018-04-14 ENCOUNTER — Telehealth: Payer: Self-pay | Admitting: *Deleted

## 2018-04-14 ENCOUNTER — Telehealth: Payer: Self-pay

## 2018-04-14 ENCOUNTER — Other Ambulatory Visit: Payer: Self-pay | Admitting: Gynecologic Oncology

## 2018-04-14 ENCOUNTER — Inpatient Hospital Stay: Payer: BC Managed Care – PPO

## 2018-04-14 ENCOUNTER — Other Ambulatory Visit: Payer: Self-pay

## 2018-04-14 DIAGNOSIS — C569 Malignant neoplasm of unspecified ovary: Secondary | ICD-10-CM

## 2018-04-14 DIAGNOSIS — Z95828 Presence of other vascular implants and grafts: Secondary | ICD-10-CM

## 2018-04-14 LAB — COMPREHENSIVE METABOLIC PANEL
ALT: 24 U/L (ref 0–44)
ANION GAP: 12 (ref 5–15)
AST: 13 U/L — ABNORMAL LOW (ref 15–41)
Albumin: 3.9 g/dL (ref 3.5–5.0)
Alkaline Phosphatase: 126 U/L (ref 38–126)
BUN: 15 mg/dL (ref 6–20)
CO2: 23 mmol/L (ref 22–32)
Calcium: 8.9 mg/dL (ref 8.9–10.3)
Chloride: 103 mmol/L (ref 98–111)
Creatinine, Ser: 0.71 mg/dL (ref 0.44–1.00)
GFR calc Af Amer: >60 mL/min (ref >60)
GFR calc non Af Amer: >60 mL/min (ref >60)
Glucose, Bld: 163 mg/dL — ABNORMAL HIGH (ref 70–99)
Potassium: 3.4 mmol/L — ABNORMAL LOW (ref 3.5–5.1)
Sodium: 138 mmol/L (ref 135–145)
Total Bilirubin: 0.3 mg/dL (ref 0.3–1.2)
Total Protein: 7.5 g/dL (ref 6.5–8.1)

## 2018-04-14 LAB — CBC WITH DIFFERENTIAL (CANCER CENTER ONLY)
Abs Immature Granulocytes: 0.01 10*3/uL (ref 0.00–0.07)
Basophils Absolute: 0 10*3/uL (ref 0.0–0.1)
Basophils Relative: 0 %
Eosinophils Absolute: 0.1 10*3/uL (ref 0.0–0.5)
Eosinophils Relative: 3 %
HCT: 34 % — ABNORMAL LOW (ref 36.0–46.0)
Hemoglobin: 11.4 g/dL — ABNORMAL LOW (ref 12.0–15.0)
Immature Granulocytes: 0 %
Lymphocytes Relative: 28 %
Lymphs Abs: 1 10*3/uL (ref 0.7–4.0)
MCH: 32 pg (ref 26.0–34.0)
MCHC: 33.5 g/dL (ref 30.0–36.0)
MCV: 95.5 fL (ref 80.0–100.0)
MONO ABS: 0.5 10*3/uL (ref 0.1–1.0)
Monocytes Relative: 12 %
Neutro Abs: 2.1 10*3/uL (ref 1.7–7.7)
Neutrophils Relative %: 57 %
Platelet Count: 194 10*3/uL (ref 150–400)
RBC: 3.56 MIL/uL — ABNORMAL LOW (ref 3.87–5.11)
RDW: 14.6 % (ref 11.5–15.5)
WBC Count: 3.7 10*3/uL — ABNORMAL LOW (ref 4.0–10.5)
nRBC: 0 % (ref 0.0–0.2)

## 2018-04-14 LAB — MAGNESIUM: Magnesium: 1.2 mg/dL — CL (ref 1.7–2.4)

## 2018-04-14 MED ORDER — ALTEPLASE 2 MG IJ SOLR
2.0000 mg | Freq: Once | INTRAMUSCULAR | Status: DC | PRN
  Filled 2018-04-14: qty 2

## 2018-04-14 MED ORDER — HEPARIN SOD (PORK) LOCK FLUSH 100 UNIT/ML IV SOLN
500.0000 [IU] | Freq: Once | INTRAVENOUS | Status: DC
  Filled 2018-04-14: qty 5

## 2018-04-14 MED ORDER — SODIUM CHLORIDE 0.9% FLUSH
10.0000 mL | INTRAVENOUS | Status: DC | PRN
  Filled 2018-04-14: qty 10

## 2018-04-14 NOTE — Progress Notes (Signed)
Patient is having labs and port flushes in [place] and being treated at [facility] per Dr. Alycia Rossetti.  All questions need to be directed to [place] at [phone]. No longer a pt of Dr. Alvy Bimler.

## 2018-04-14 NOTE — Telephone Encounter (Signed)
Incoming call 14:56  from lab for panic level 1.2 Magnesium- Made Joylene John NP aware.

## 2018-04-14 NOTE — Telephone Encounter (Signed)
Fax lab results to [facility]  

## 2018-04-18 ENCOUNTER — Ambulatory Visit: Payer: BC Managed Care – PPO | Admitting: Cardiology

## 2018-04-19 DIAGNOSIS — C561 Malignant neoplasm of right ovary: Principal | ICD-10-CM

## 2018-04-19 DIAGNOSIS — Z5111 Encounter for antineoplastic chemotherapy: Principal | ICD-10-CM

## 2018-04-21 ENCOUNTER — Telehealth: Payer: Self-pay | Admitting: *Deleted

## 2018-04-21 NOTE — Telephone Encounter (Signed)
Returned the patient's call and scheduled her for labs tomorrow. Prescreened the patient, she has had any signs/symptoms, traveled or exposure. She has had no contact with anyone sick or had any exposure. Explained that she will be asked the questions again tomorrow, along with at temperature check at the front desk. Also explained the no visitor policy

## 2018-04-22 ENCOUNTER — Other Ambulatory Visit: Payer: Self-pay

## 2018-04-22 ENCOUNTER — Inpatient Hospital Stay: Payer: BC Managed Care – PPO | Attending: Gynecologic Oncology

## 2018-04-22 ENCOUNTER — Telehealth: Payer: Self-pay | Admitting: *Deleted

## 2018-04-22 DIAGNOSIS — C569 Malignant neoplasm of unspecified ovary: Secondary | ICD-10-CM | POA: Insufficient documentation

## 2018-04-22 LAB — CBC WITH DIFFERENTIAL (CANCER CENTER ONLY)
Abs Immature Granulocytes: 0.05 10*3/uL (ref 0.00–0.07)
Basophils Absolute: 0 10*3/uL (ref 0.0–0.1)
Basophils Relative: 0 %
Eosinophils Absolute: 0 10*3/uL (ref 0.0–0.5)
Eosinophils Relative: 1 %
HCT: 30.6 % — ABNORMAL LOW (ref 36.0–46.0)
Hemoglobin: 10.7 g/dL — ABNORMAL LOW (ref 12.0–15.0)
Immature Granulocytes: 1 %
Lymphocytes Relative: 21 %
Lymphs Abs: 1 10*3/uL (ref 0.7–4.0)
MCH: 33.2 pg (ref 26.0–34.0)
MCHC: 35 g/dL (ref 30.0–36.0)
MCV: 95 fL (ref 80.0–100.0)
Monocytes Absolute: 0.4 10*3/uL (ref 0.1–1.0)
Monocytes Relative: 9 %
Neutro Abs: 3.4 10*3/uL (ref 1.7–7.7)
Neutrophils Relative %: 68 %
Platelet Count: 110 10*3/uL — ABNORMAL LOW (ref 150–400)
RBC: 3.22 MIL/uL — ABNORMAL LOW (ref 3.87–5.11)
RDW: 15.1 % (ref 11.5–15.5)
WBC Count: 4.9 10*3/uL (ref 4.0–10.5)
nRBC: 0.4 % — ABNORMAL HIGH (ref 0.0–0.2)

## 2018-04-22 LAB — COMPREHENSIVE METABOLIC PANEL
ALT: 19 U/L (ref 0–44)
AST: 16 U/L (ref 15–41)
Albumin: 3.3 g/dL — ABNORMAL LOW (ref 3.5–5.0)
Alkaline Phosphatase: 144 U/L — ABNORMAL HIGH (ref 38–126)
Anion gap: 14 (ref 5–15)
BUN: 9 mg/dL (ref 6–20)
CO2: 22 mmol/L (ref 22–32)
Calcium: 8.2 mg/dL — ABNORMAL LOW (ref 8.9–10.3)
Chloride: 101 mmol/L (ref 98–111)
Creatinine, Ser: 0.62 mg/dL (ref 0.44–1.00)
GFR calc Af Amer: >60 mL/min (ref >60)
GFR calc non Af Amer: >60 mL/min (ref >60)
Glucose, Bld: 141 mg/dL — ABNORMAL HIGH (ref 70–99)
Potassium: 3.5 mmol/L (ref 3.5–5.1)
Sodium: 137 mmol/L (ref 135–145)
Total Bilirubin: 0.3 mg/dL (ref 0.3–1.2)
Total Protein: 7.1 g/dL (ref 6.5–8.1)

## 2018-04-22 LAB — MAGNESIUM: Magnesium: 1 mg/dL — CL (ref 1.7–2.4)

## 2018-04-22 NOTE — Telephone Encounter (Signed)
Fax lab results to [facility]

## 2018-04-25 ENCOUNTER — Ambulatory Visit: Admit: 2018-04-25 | Discharge: 2018-04-26 | Payer: BLUE CROSS/BLUE SHIELD

## 2018-04-25 DIAGNOSIS — Z5111 Encounter for antineoplastic chemotherapy: Principal | ICD-10-CM

## 2018-04-25 DIAGNOSIS — C561 Malignant neoplasm of right ovary: Secondary | ICD-10-CM

## 2018-04-27 ENCOUNTER — Inpatient Hospital Stay: Payer: BC Managed Care – PPO

## 2018-05-02 ENCOUNTER — Telehealth: Payer: Self-pay | Admitting: *Deleted

## 2018-05-02 NOTE — Telephone Encounter (Signed)
Called and left the patient a message to call the office. Patient needs to be scheduled for lab appts.

## 2018-05-03 MED ORDER — OXYCODONE 5 MG TABLET
ORAL_TABLET | Freq: Four times a day (QID) | ORAL | 0 refills | 0.00000 days | Status: CP | PRN
Start: 2018-05-03 — End: 2018-05-23

## 2018-05-04 ENCOUNTER — Telehealth: Payer: Self-pay | Admitting: *Deleted

## 2018-05-04 NOTE — Telephone Encounter (Signed)
Called and left the patient a message to call the office back. Patient needs to be scheduled for labs

## 2018-05-06 ENCOUNTER — Telehealth: Payer: Self-pay | Admitting: *Deleted

## 2018-05-06 NOTE — Telephone Encounter (Signed)
Called and left the patient a message to call the office back. Need to schedule labs for [facility]

## 2018-05-11 ENCOUNTER — Telehealth: Payer: Self-pay | Admitting: *Deleted

## 2018-05-11 ENCOUNTER — Inpatient Hospital Stay: Payer: BC Managed Care – PPO

## 2018-05-11 ENCOUNTER — Encounter: Admit: 2018-05-11 | Discharge: 2018-05-11 | Payer: BLUE CROSS/BLUE SHIELD

## 2018-05-11 DIAGNOSIS — C561 Malignant neoplasm of right ovary: Principal | ICD-10-CM

## 2018-05-11 DIAGNOSIS — Z5111 Encounter for antineoplastic chemotherapy: Secondary | ICD-10-CM

## 2018-05-11 NOTE — Telephone Encounter (Signed)
Patient called and moved her lab appt from 4/22 to 4/23.

## 2018-05-12 ENCOUNTER — Other Ambulatory Visit: Payer: Self-pay

## 2018-05-12 ENCOUNTER — Telehealth: Payer: Self-pay | Admitting: Emergency Medicine

## 2018-05-12 ENCOUNTER — Telehealth: Payer: Self-pay | Admitting: *Deleted

## 2018-05-12 ENCOUNTER — Inpatient Hospital Stay: Payer: BC Managed Care – PPO

## 2018-05-12 DIAGNOSIS — C569 Malignant neoplasm of unspecified ovary: Secondary | ICD-10-CM

## 2018-05-12 LAB — COMPREHENSIVE METABOLIC PANEL
ALT: 20 U/L (ref 0–44)
AST: 18 U/L (ref 15–41)
Albumin: 3.5 g/dL (ref 3.5–5.0)
Alkaline Phosphatase: 144 U/L — ABNORMAL HIGH (ref 38–126)
Anion gap: 14 (ref 5–15)
BUN: 13 mg/dL (ref 6–20)
CO2: 22 mmol/L (ref 22–32)
Calcium: 8.3 mg/dL — ABNORMAL LOW (ref 8.9–10.3)
Chloride: 99 mmol/L (ref 98–111)
Creatinine, Ser: 0.71 mg/dL (ref 0.44–1.00)
GFR calc Af Amer: >60 mL/min (ref >60)
GFR calc non Af Amer: >60 mL/min (ref >60)
Glucose, Bld: 180 mg/dL — ABNORMAL HIGH (ref 70–99)
Potassium: 3.7 mmol/L (ref 3.5–5.1)
Sodium: 135 mmol/L (ref 135–145)
Total Bilirubin: 0.3 mg/dL (ref 0.3–1.2)
Total Protein: 7.9 g/dL (ref 6.5–8.1)

## 2018-05-12 LAB — CBC WITH DIFFERENTIAL (CANCER CENTER ONLY)
Abs Immature Granulocytes: 0.05 10*3/uL (ref 0.00–0.07)
Basophils Absolute: 0 10*3/uL (ref 0.0–0.1)
Basophils Relative: 0 %
Eosinophils Absolute: 0 10*3/uL (ref 0.0–0.5)
Eosinophils Relative: 0 %
HCT: 29.6 % — ABNORMAL LOW (ref 36.0–46.0)
Hemoglobin: 10.1 g/dL — ABNORMAL LOW (ref 12.0–15.0)
Immature Granulocytes: 2 %
Lymphocytes Relative: 30 %
Lymphs Abs: 1 10*3/uL (ref 0.7–4.0)
MCH: 33.7 pg (ref 26.0–34.0)
MCHC: 34.1 g/dL (ref 30.0–36.0)
MCV: 98.7 fL (ref 80.0–100.0)
Monocytes Absolute: 0.5 10*3/uL (ref 0.1–1.0)
Monocytes Relative: 15 %
Neutro Abs: 1.8 10*3/uL (ref 1.7–7.7)
Neutrophils Relative %: 53 %
Platelet Count: 104 10*3/uL — ABNORMAL LOW (ref 150–400)
RBC: 3 MIL/uL — ABNORMAL LOW (ref 3.87–5.11)
RDW: 16.4 % — ABNORMAL HIGH (ref 11.5–15.5)
WBC Count: 3.4 10*3/uL — ABNORMAL LOW (ref 4.0–10.5)
nRBC: 0.6 % — ABNORMAL HIGH (ref 0.0–0.2)

## 2018-05-12 LAB — MAGNESIUM: Magnesium: 1.1 mg/dL — CL (ref 1.7–2.4)

## 2018-05-12 NOTE — Telephone Encounter (Signed)
This RN received called critical lab value of mg++ of 1.1.  Called pt who stated " that's about where I stay "  Pt is taking mag ox as prescribed.  She states increased loose stools " at least 15 " post having CT yesterday with contrast.  Report taken to GYN clinic and given to Joylene John NP.

## 2018-05-12 NOTE — Telephone Encounter (Signed)
Magnesium 1.1 per lab, critical value read back and confirmed.  No answer from MD Alvy Bimler or her desk RN Clarise Cruz.  Called Val RN who confirmed lab value and stated she would f/u on it.

## 2018-05-16 ENCOUNTER — Encounter: Admit: 2018-05-16 | Discharge: 2018-05-16 | Payer: BLUE CROSS/BLUE SHIELD

## 2018-05-16 ENCOUNTER — Other Ambulatory Visit: Admit: 2018-05-16 | Discharge: 2018-05-16 | Payer: BLUE CROSS/BLUE SHIELD

## 2018-05-16 DIAGNOSIS — C561 Malignant neoplasm of right ovary: Secondary | ICD-10-CM

## 2018-05-16 DIAGNOSIS — Z5111 Encounter for antineoplastic chemotherapy: Principal | ICD-10-CM

## 2018-05-23 MED ORDER — OXYCODONE 5 MG TABLET
ORAL_TABLET | Freq: Four times a day (QID) | ORAL | 0 refills | 0.00000 days | Status: CP | PRN
Start: 2018-05-23 — End: 2018-06-21

## 2018-05-25 ENCOUNTER — Other Ambulatory Visit: Payer: BC Managed Care – PPO

## 2018-05-30 ENCOUNTER — Inpatient Hospital Stay: Payer: BC Managed Care – PPO | Attending: Gynecologic Oncology

## 2018-05-30 ENCOUNTER — Other Ambulatory Visit: Payer: Self-pay

## 2018-05-30 ENCOUNTER — Telehealth: Payer: Self-pay

## 2018-05-30 DIAGNOSIS — Z87891 Personal history of nicotine dependence: Secondary | ICD-10-CM | POA: Insufficient documentation

## 2018-05-30 DIAGNOSIS — Z9221 Personal history of antineoplastic chemotherapy: Secondary | ICD-10-CM | POA: Insufficient documentation

## 2018-05-30 DIAGNOSIS — C569 Malignant neoplasm of unspecified ovary: Secondary | ICD-10-CM

## 2018-05-30 DIAGNOSIS — C562 Malignant neoplasm of left ovary: Secondary | ICD-10-CM | POA: Diagnosis present

## 2018-05-30 DIAGNOSIS — C561 Malignant neoplasm of right ovary: Secondary | ICD-10-CM | POA: Insufficient documentation

## 2018-05-30 DIAGNOSIS — Z90710 Acquired absence of both cervix and uterus: Secondary | ICD-10-CM | POA: Diagnosis not present

## 2018-05-30 LAB — COMPREHENSIVE METABOLIC PANEL
ALT: 25 U/L (ref 0–44)
AST: 18 U/L (ref 15–41)
Albumin: 3.2 g/dL — ABNORMAL LOW (ref 3.5–5.0)
Alkaline Phosphatase: 135 U/L — ABNORMAL HIGH (ref 38–126)
Anion gap: 23 — ABNORMAL HIGH (ref 5–15)
BUN: 10 mg/dL (ref 6–20)
CO2: 13 mmol/L — ABNORMAL LOW (ref 22–32)
Calcium: 8.5 mg/dL — ABNORMAL LOW (ref 8.9–10.3)
Chloride: 98 mmol/L (ref 98–111)
Creatinine, Ser: 0.65 mg/dL (ref 0.44–1.00)
GFR calc Af Amer: >60 mL/min (ref >60)
GFR calc non Af Amer: >60 mL/min (ref >60)
Glucose, Bld: 173 mg/dL — ABNORMAL HIGH (ref 70–99)
Potassium: 3.4 mmol/L — ABNORMAL LOW (ref 3.5–5.1)
Sodium: 134 mmol/L — ABNORMAL LOW (ref 135–145)
Total Bilirubin: 0.3 mg/dL (ref 0.3–1.2)
Total Protein: 7.5 g/dL (ref 6.5–8.1)

## 2018-05-30 LAB — CBC WITH DIFFERENTIAL (CANCER CENTER ONLY)
Abs Immature Granulocytes: 0.01 10*3/uL (ref 0.00–0.07)
Basophils Absolute: 0 10*3/uL (ref 0.0–0.1)
Basophils Relative: 0 %
Eosinophils Absolute: 0 10*3/uL (ref 0.0–0.5)
Eosinophils Relative: 1 %
HCT: 23.5 % — ABNORMAL LOW (ref 36.0–46.0)
Hemoglobin: 8.1 g/dL — ABNORMAL LOW (ref 12.0–15.0)
Immature Granulocytes: 1 %
Lymphocytes Relative: 45 %
Lymphs Abs: 0.7 10*3/uL (ref 0.7–4.0)
MCH: 35.7 pg — ABNORMAL HIGH (ref 26.0–34.0)
MCHC: 34.5 g/dL (ref 30.0–36.0)
MCV: 103.5 fL — ABNORMAL HIGH (ref 80.0–100.0)
Monocytes Absolute: 0.3 10*3/uL (ref 0.1–1.0)
Monocytes Relative: 20 %
Neutro Abs: 0.5 10*3/uL — ABNORMAL LOW (ref 1.7–7.7)
Neutrophils Relative %: 33 %
Platelet Count: 92 10*3/uL — ABNORMAL LOW (ref 150–400)
RBC: 2.27 MIL/uL — ABNORMAL LOW (ref 3.87–5.11)
RDW: 18.2 % — ABNORMAL HIGH (ref 11.5–15.5)
WBC Count: 1.6 10*3/uL — ABNORMAL LOW (ref 4.0–10.5)
nRBC: 1.3 % — ABNORMAL HIGH (ref 0.0–0.2)

## 2018-05-30 LAB — MAGNESIUM: Magnesium: 1.1 mg/dL — CL (ref 1.7–2.4)

## 2018-05-30 NOTE — Telephone Encounter (Signed)
Faxed labs to [facility] including critical values. Fax:[phone]. Joylene John, NP notified.

## 2018-05-31 IMAGING — CT CT ABD-PELV W/ CM
2 of 4 series · 15 of 46 positions shown, 17 images · IV contrast (APPLIED)
Comparison: 06/19/2014

CLINICAL DATA: Ovarian cancer. Increased weakness. Nectar and
fever.

EXAM:
CT ABDOMEN AND PELVIS WITH CONTRAST
TECHNIQUE: Multidetector CT imaging of the abdomen and pelvis was performed
using the standard protocol following bolus administration of
intravenous contrast.
CONTRAST:  100mL X95QSV-V44 IOPAMIDOL (X95QSV-V44) INJECTION 61%,
15mL [id number] IOPAMIDOL ([id number]) INJECTION 61%

[Series 2: axial st · axial · 0.83mm/px · z∈[-655,-215]mm · 12 of 96 slices shown, 14 images]
[im 4/96  soft-tissue]
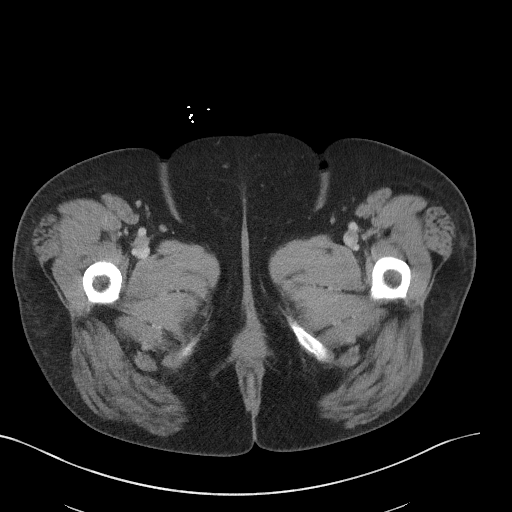
[im 4/96  bone]
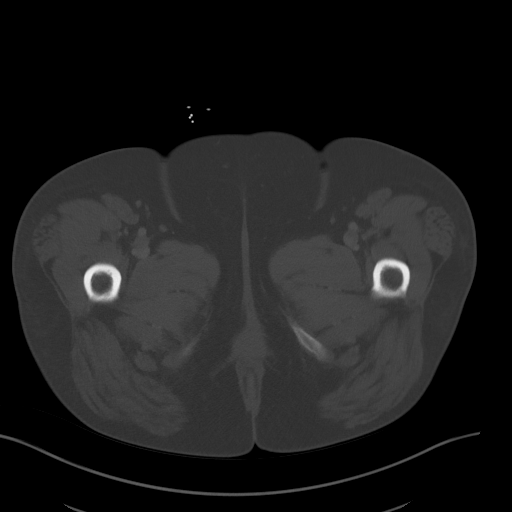
[im 12/96  soft-tissue]
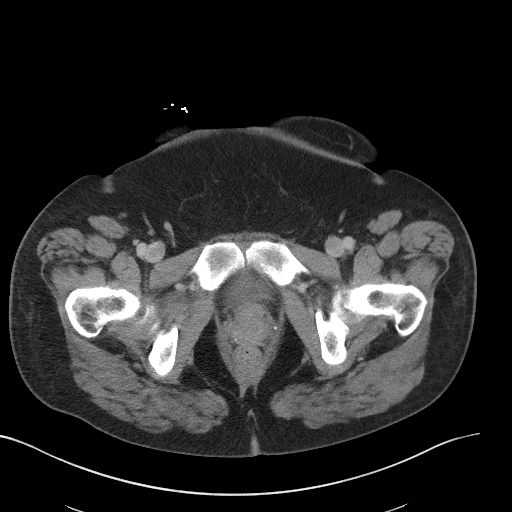
[im 20/96  soft-tissue]
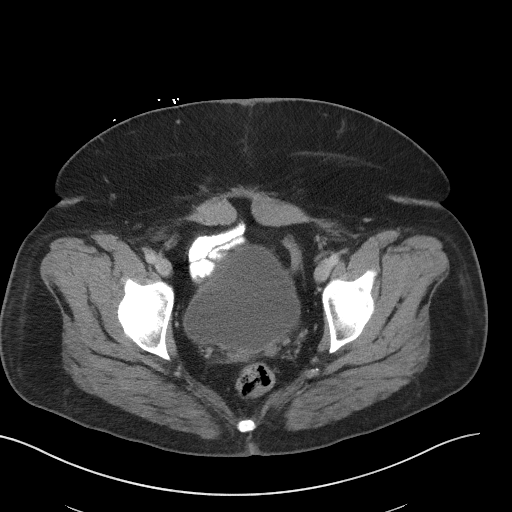
[im 28/96  soft-tissue]
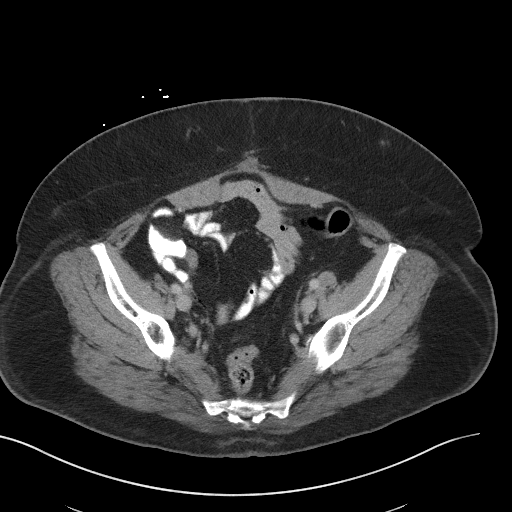
[im 36/96  soft-tissue]
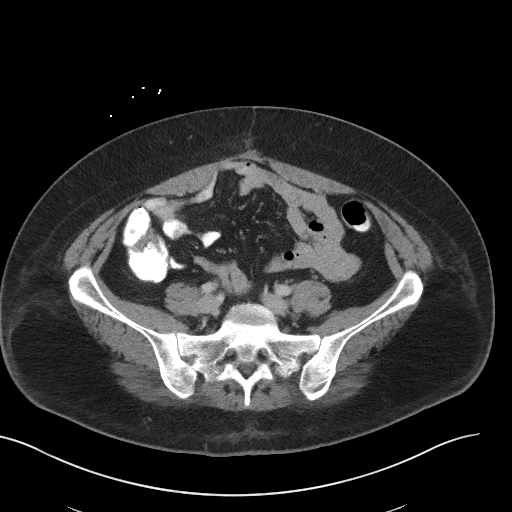
[im 44/96  soft-tissue]
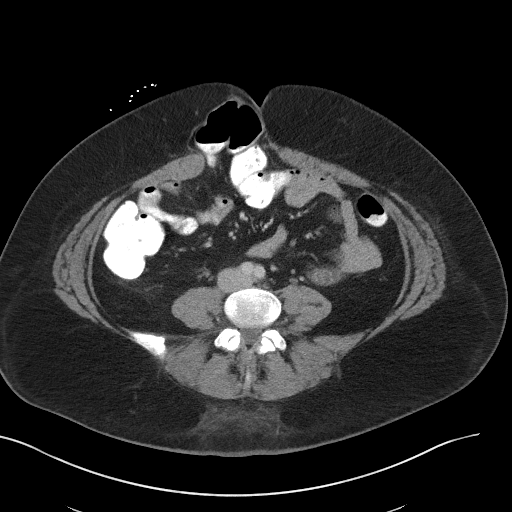
[im 52/96  soft-tissue]
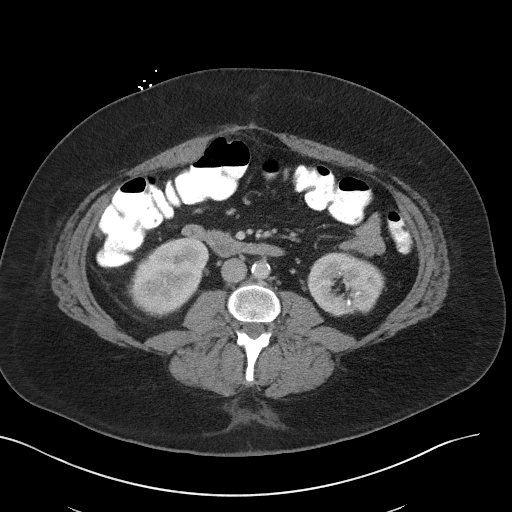
[im 60/96  soft-tissue]
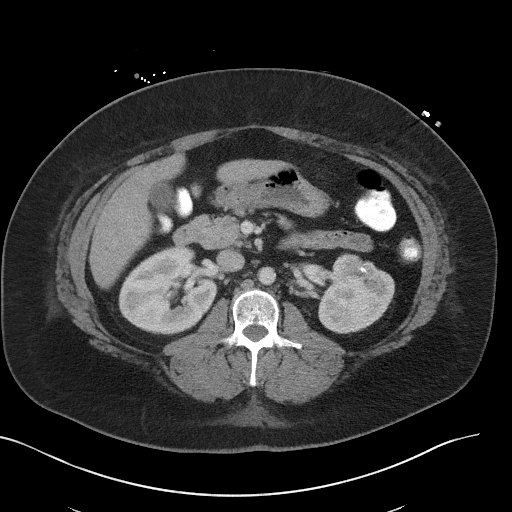
[im 68/96  soft-tissue]
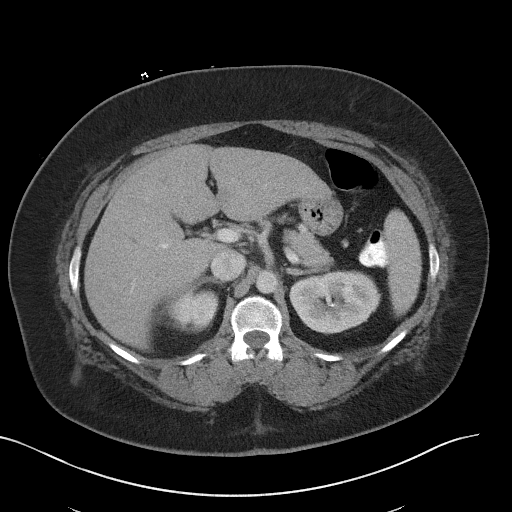
[im 68/96  bone]
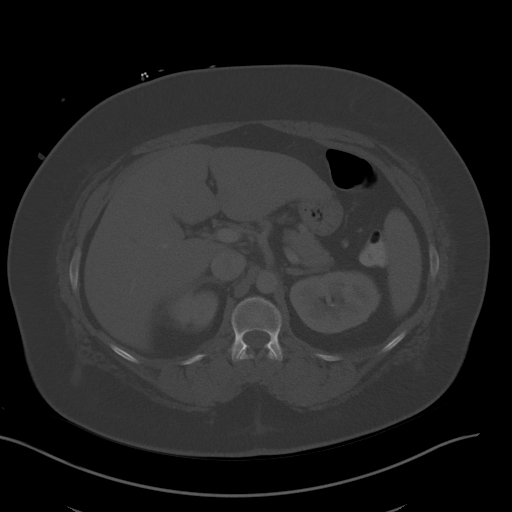
[im 76/96  soft-tissue]
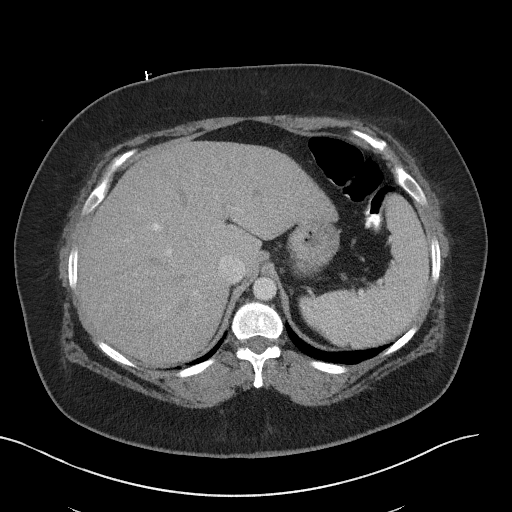
[im 84/96  soft-tissue]
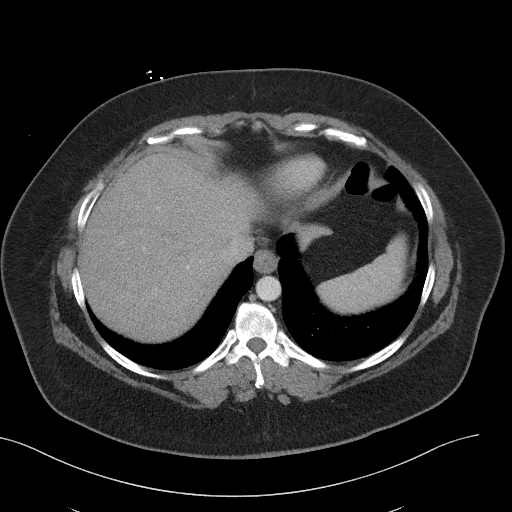
[im 92/96  soft-tissue]
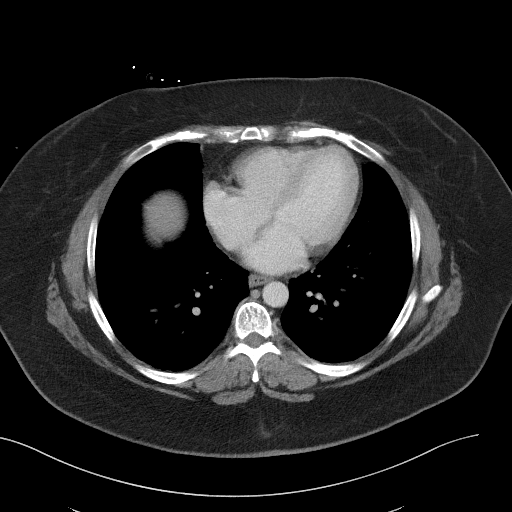

[Series 4: coronal st · coronal · 0.76mm/px · 3 of 94 slices shown]
[im 32/94  soft-tissue]
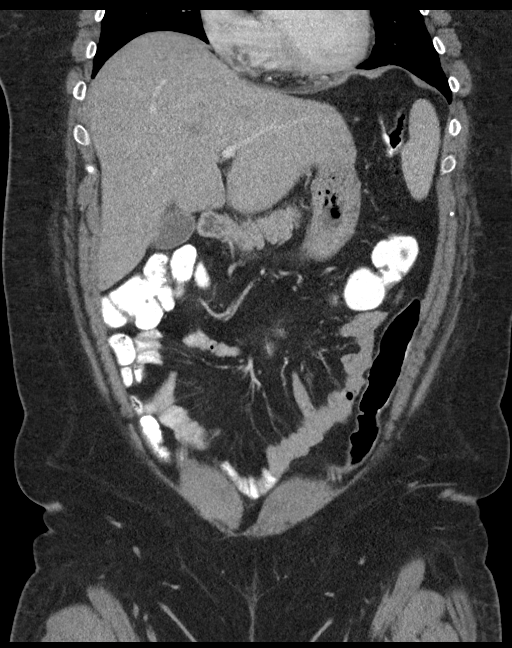
[im 42/94  soft-tissue]
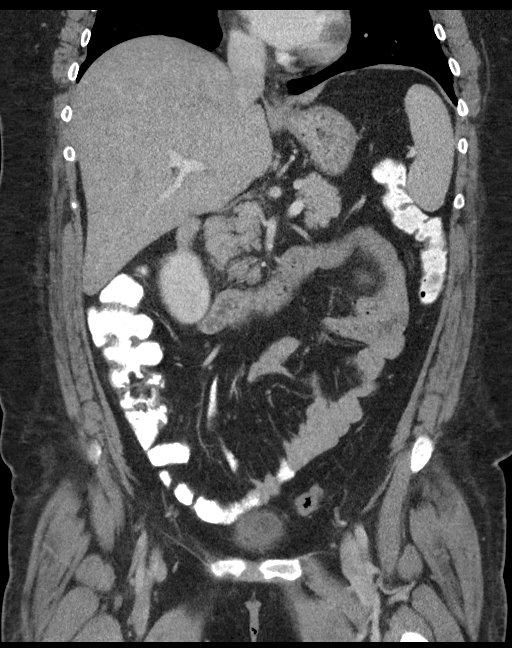
[im 52/94  soft-tissue]
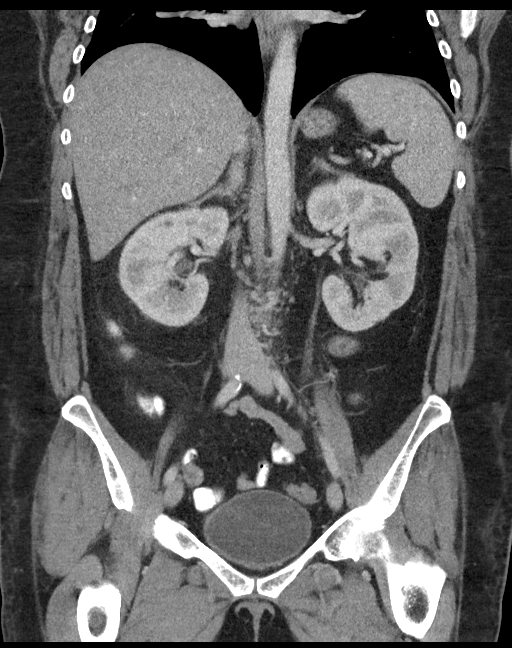

[15 of 46 positions shown; findings below may reference images not displayed]

FINDINGS: Lower chest:  Unremarkable.

Hepatobiliary: No focal abnormality within the liver parenchyma.
There is no evidence for gallstones, gallbladder wall thickening, or
pericholecystic fluid. No intrahepatic or extrahepatic biliary
dilation.

Pancreas: No focal mass lesion. No dilatation of the main duct. No
intraparenchymal cyst. No peripancreatic edema.

Spleen: No splenomegaly. No focal mass lesion.

Adrenals/Urinary Tract: Multiple areas of scarring are noted in the
left kidney with multiple nonobstructing left renal stones, as
before. Dominant stone left kidney is an interpolar posterior 6 mm
stone. 2.1 cm cyst upper pole right kidney was 1.7 cm previously. 11
mm cyst identified lower pole right kidney subtle area of
hypoperfusion is identified in the interpolar right kidney on
today's study, nonspecific, but does raise the question of
pyelonephritis. No evidence for hydronephrosis. The urinary bladder
appears normal for the degree of distention.

Stomach/Bowel: Stomach is nondistended. No gastric wall thickening.
No evidence of outlet obstruction. Duodenum is normally positioned
as is the ligament of Treitz. No small bowel wall thickening. No
small bowel dilatation. The terminal ileum is normal. The appendix
is normal. No gross colonic mass. No colonic wall thickening. No
substantial diverticular change.

Vascular/Lymphatic: There is abdominal aortic atherosclerosis
without aneurysm. There is no gastrohepatic or hepatoduodenal
ligament lymphadenopathy. No intraperitoneal or retroperitoneal
lymphadenopathy. No pelvic sidewall lymphadenopathy.

Reproductive: Uterus surgically absent.  There is no adnexal mass.

Other: No intraperitoneal free fluid.

Musculoskeletal: Right paraumbilical hernia contains a loop of
transverse colon without complicating features. Bone windows reveal
no worrisome lytic or sclerotic osseous lesions.
IMPRESSION: 1. Focal area of altered perfusion in the interpolar right kidney.
Pyelonephritis would be a consideration.
2. Nonobstructing left renal stones with associated scarring.
3. Stable appearance right renal cysts.

## 2018-06-02 ENCOUNTER — Telehealth: Payer: Self-pay | Admitting: Hematology and Oncology

## 2018-06-02 ENCOUNTER — Encounter: Admit: 2018-06-02 | Discharge: 2018-06-03 | Payer: PRIVATE HEALTH INSURANCE

## 2018-06-02 ENCOUNTER — Ambulatory Visit: Admit: 2018-06-02 | Discharge: 2018-06-03 | Payer: BLUE CROSS/BLUE SHIELD

## 2018-06-02 DIAGNOSIS — D6481 Anemia due to antineoplastic chemotherapy: Secondary | ICD-10-CM

## 2018-06-02 DIAGNOSIS — C561 Malignant neoplasm of right ovary: Principal | ICD-10-CM

## 2018-06-02 DIAGNOSIS — Z5111 Encounter for antineoplastic chemotherapy: Secondary | ICD-10-CM

## 2018-06-02 DIAGNOSIS — T451X5A Adverse effect of antineoplastic and immunosuppressive drugs, initial encounter: Secondary | ICD-10-CM

## 2018-06-02 NOTE — Telephone Encounter (Signed)
Lab closing early 5/20 - time change to earlier. Left message for patient. Schedule mailed.

## 2018-06-06 ENCOUNTER — Inpatient Hospital Stay: Payer: BC Managed Care – PPO

## 2018-06-06 ENCOUNTER — Other Ambulatory Visit: Payer: Self-pay | Admitting: Gynecologic Oncology

## 2018-06-06 ENCOUNTER — Telehealth: Payer: Self-pay | Admitting: *Deleted

## 2018-06-06 ENCOUNTER — Other Ambulatory Visit: Payer: Self-pay

## 2018-06-06 DIAGNOSIS — C569 Malignant neoplasm of unspecified ovary: Secondary | ICD-10-CM

## 2018-06-06 DIAGNOSIS — C561 Malignant neoplasm of right ovary: Secondary | ICD-10-CM | POA: Diagnosis not present

## 2018-06-06 LAB — CBC WITH DIFFERENTIAL (CANCER CENTER ONLY)
Abs Immature Granulocytes: 0.04 10*3/uL (ref 0.00–0.07)
Basophils Absolute: 0 10*3/uL (ref 0.0–0.1)
Basophils Relative: 0 %
Eosinophils Absolute: 0 10*3/uL (ref 0.0–0.5)
Eosinophils Relative: 0 %
HCT: 26 % — ABNORMAL LOW (ref 36.0–46.0)
Hemoglobin: 8.7 g/dL — ABNORMAL LOW (ref 12.0–15.0)
Immature Granulocytes: 1 %
Lymphocytes Relative: 20 %
Lymphs Abs: 0.9 10*3/uL (ref 0.7–4.0)
MCH: 35.4 pg — ABNORMAL HIGH (ref 26.0–34.0)
MCHC: 33.5 g/dL (ref 30.0–36.0)
MCV: 105.7 fL — ABNORMAL HIGH (ref 80.0–100.0)
Monocytes Absolute: 0.4 10*3/uL (ref 0.1–1.0)
Monocytes Relative: 9 %
Neutro Abs: 3.3 10*3/uL (ref 1.7–7.7)
Neutrophils Relative %: 70 %
Platelet Count: 85 10*3/uL — ABNORMAL LOW (ref 150–400)
RBC: 2.46 MIL/uL — ABNORMAL LOW (ref 3.87–5.11)
RDW: 18.6 % — ABNORMAL HIGH (ref 11.5–15.5)
WBC Count: 4.7 10*3/uL (ref 4.0–10.5)
nRBC: 0.6 % — ABNORMAL HIGH (ref 0.0–0.2)

## 2018-06-06 LAB — MAGNESIUM: Magnesium: 1.1 mg/dL — CL (ref 1.7–2.4)

## 2018-06-06 LAB — CMP (CANCER CENTER ONLY)
ALT: 26 U/L (ref 0–44)
AST: 22 U/L (ref 15–41)
Albumin: 3.5 g/dL (ref 3.5–5.0)
Alkaline Phosphatase: 134 U/L — ABNORMAL HIGH (ref 38–126)
Anion gap: 10 (ref 5–15)
BUN: 12 mg/dL (ref 6–20)
CO2: 27 mmol/L (ref 22–32)
Calcium: 8.8 mg/dL — ABNORMAL LOW (ref 8.9–10.3)
Chloride: 102 mmol/L (ref 98–111)
Creatinine: 0.69 mg/dL (ref 0.44–1.00)
GFR, Est AFR Am: >60 mL/min (ref >60)
GFR, Estimated: >60 mL/min (ref >60)
Glucose, Bld: 176 mg/dL — ABNORMAL HIGH (ref 70–99)
Potassium: 3.6 mmol/L (ref 3.5–5.1)
Sodium: 139 mmol/L (ref 135–145)
Total Bilirubin: 0.5 mg/dL (ref 0.3–1.2)
Total Protein: 7.3 g/dL (ref 6.5–8.1)

## 2018-06-06 NOTE — Telephone Encounter (Signed)
Called and gave the patient her lab appt for next Tuesday

## 2018-06-06 NOTE — Progress Notes (Signed)
Labs per [facility].

## 2018-06-06 NOTE — Telephone Encounter (Signed)
Received call from the lab with Magnesium level of 1.1.Montez Hageman, NP notified via in-basket message

## 2018-06-07 ENCOUNTER — Telehealth (HOSPITAL_COMMUNITY): Payer: Self-pay | Admitting: Radiology

## 2018-06-07 NOTE — Telephone Encounter (Signed)
Called to reschedule patient for echocardiogram. Left message to call back.

## 2018-06-08 ENCOUNTER — Inpatient Hospital Stay: Payer: BC Managed Care – PPO

## 2018-06-14 ENCOUNTER — Other Ambulatory Visit: Payer: Self-pay

## 2018-06-14 ENCOUNTER — Inpatient Hospital Stay: Payer: BC Managed Care – PPO

## 2018-06-14 DIAGNOSIS — C561 Malignant neoplasm of right ovary: Secondary | ICD-10-CM | POA: Diagnosis not present

## 2018-06-14 DIAGNOSIS — C569 Malignant neoplasm of unspecified ovary: Secondary | ICD-10-CM

## 2018-06-14 LAB — COMPREHENSIVE METABOLIC PANEL
ALT: 23 U/L (ref 0–44)
AST: 21 U/L (ref 15–41)
Albumin: 3.8 g/dL (ref 3.5–5.0)
Alkaline Phosphatase: 127 U/L — ABNORMAL HIGH (ref 38–126)
Anion gap: 11 (ref 5–15)
BUN: 10 mg/dL (ref 6–20)
CO2: 26 mmol/L (ref 22–32)
Calcium: 9.3 mg/dL (ref 8.9–10.3)
Chloride: 102 mmol/L (ref 98–111)
Creatinine, Ser: 0.68 mg/dL (ref 0.44–1.00)
GFR calc Af Amer: >60 mL/min (ref >60)
GFR calc non Af Amer: >60 mL/min (ref >60)
Glucose, Bld: 139 mg/dL — ABNORMAL HIGH (ref 70–99)
Potassium: 3.7 mmol/L (ref 3.5–5.1)
Sodium: 139 mmol/L (ref 135–145)
Total Bilirubin: 0.3 mg/dL (ref 0.3–1.2)
Total Protein: 7.4 g/dL (ref 6.5–8.1)

## 2018-06-14 LAB — CBC WITH DIFFERENTIAL (CANCER CENTER ONLY)
Abs Immature Granulocytes: 0.03 10*3/uL (ref 0.00–0.07)
Basophils Absolute: 0 10*3/uL (ref 0.0–0.1)
Basophils Relative: 0 %
Eosinophils Absolute: 0.1 10*3/uL (ref 0.0–0.5)
Eosinophils Relative: 2 %
HCT: 29.1 % — ABNORMAL LOW (ref 36.0–46.0)
Hemoglobin: 9.4 g/dL — ABNORMAL LOW (ref 12.0–15.0)
Immature Granulocytes: 1 %
Lymphocytes Relative: 26 %
Lymphs Abs: 1.1 10*3/uL (ref 0.7–4.0)
MCH: 35.3 pg — ABNORMAL HIGH (ref 26.0–34.0)
MCHC: 32.3 g/dL (ref 30.0–36.0)
MCV: 109.4 fL — ABNORMAL HIGH (ref 80.0–100.0)
Monocytes Absolute: 0.6 10*3/uL (ref 0.1–1.0)
Monocytes Relative: 14 %
Neutro Abs: 2.5 10*3/uL (ref 1.7–7.7)
Neutrophils Relative %: 57 %
Platelet Count: 170 10*3/uL (ref 150–400)
RBC: 2.66 MIL/uL — ABNORMAL LOW (ref 3.87–5.11)
RDW: 19.2 % — ABNORMAL HIGH (ref 11.5–15.5)
WBC Count: 4.3 10*3/uL (ref 4.0–10.5)
nRBC: 0 % (ref 0.0–0.2)

## 2018-06-14 LAB — MAGNESIUM: Magnesium: 1.5 mg/dL — ABNORMAL LOW (ref 1.7–2.4)

## 2018-06-15 ENCOUNTER — Encounter: Admit: 2018-06-15 | Discharge: 2018-06-16 | Payer: PRIVATE HEALTH INSURANCE

## 2018-06-15 DIAGNOSIS — C561 Malignant neoplasm of right ovary: Secondary | ICD-10-CM

## 2018-06-15 DIAGNOSIS — Z5111 Encounter for antineoplastic chemotherapy: Principal | ICD-10-CM

## 2018-06-17 ENCOUNTER — Telehealth: Payer: Self-pay | Admitting: Adult Health

## 2018-06-17 NOTE — Telephone Encounter (Signed)
Spoke with Pt. She state she is no longer taking Mekinst as it caused her to have a reaction and it now back getting the infusion. She report while taking Mekinst it required her to have an ECHO every so often and was questioning as to whether she sill need to have it done. Will route to pt's MD an PA who last saw her.

## 2018-06-17 NOTE — Telephone Encounter (Signed)
New Message   Patient has echo scheduled for 06/23/18 and states she was only getting echos due to being on Mekinst and she's not taking the medication anymore.  Patient wants to know if she still needs to have echo done.

## 2018-06-19 NOTE — Telephone Encounter (Signed)
She does not need to have an echo.  It can be cancelled.

## 2018-06-20 ENCOUNTER — Telehealth: Payer: Self-pay | Admitting: Obstetrics

## 2018-06-20 NOTE — Telephone Encounter (Signed)
ECHO cancelled Pt notified-left detailed message

## 2018-06-20 NOTE — Telephone Encounter (Signed)
Lab closing early 6/3 and 6/17./

## 2018-06-20 NOTE — Telephone Encounter (Signed)
Lab closing early 6/3 and 6/17. Left message re new time. Patient also my chart active.

## 2018-06-21 ENCOUNTER — Other Ambulatory Visit: Payer: Self-pay | Admitting: Gynecologic Oncology

## 2018-06-21 ENCOUNTER — Telehealth: Payer: Self-pay | Admitting: *Deleted

## 2018-06-21 DIAGNOSIS — C569 Malignant neoplasm of unspecified ovary: Secondary | ICD-10-CM

## 2018-06-21 MED ORDER — LIDOCAINE-DIPHENHYD-AL-MAG-SIM 200 MG-25 MG-400 MG-40MG/30ML MOUTHWASH
Freq: Four times a day (QID) | OROMUCOSAL | 2 refills | 0.00000 days | Status: CP | PRN
Start: 2018-06-21 — End: ?

## 2018-06-21 MED ORDER — OXYCODONE 5 MG TABLET
ORAL_TABLET | Freq: Four times a day (QID) | ORAL | 0 refills | 0.00000 days | Status: CP | PRN
Start: 2018-06-21 — End: ?

## 2018-06-21 NOTE — Telephone Encounter (Signed)
Per [facility] APP I have called and scheduled the patient for lab appts

## 2018-06-22 ENCOUNTER — Inpatient Hospital Stay: Payer: BC Managed Care – PPO

## 2018-06-23 ENCOUNTER — Other Ambulatory Visit (HOSPITAL_COMMUNITY): Payer: BC Managed Care – PPO

## 2018-06-28 ENCOUNTER — Inpatient Hospital Stay: Payer: BC Managed Care – PPO | Attending: Gynecologic Oncology

## 2018-06-28 ENCOUNTER — Other Ambulatory Visit: Payer: Self-pay

## 2018-06-28 ENCOUNTER — Telehealth: Payer: Self-pay | Admitting: *Deleted

## 2018-06-28 DIAGNOSIS — C562 Malignant neoplasm of left ovary: Secondary | ICD-10-CM | POA: Diagnosis present

## 2018-06-28 DIAGNOSIS — C561 Malignant neoplasm of right ovary: Secondary | ICD-10-CM | POA: Diagnosis not present

## 2018-06-28 DIAGNOSIS — C569 Malignant neoplasm of unspecified ovary: Secondary | ICD-10-CM

## 2018-06-28 LAB — CBC WITH DIFFERENTIAL (CANCER CENTER ONLY)
Abs Immature Granulocytes: 0.01 10*3/uL (ref 0.00–0.07)
Basophils Absolute: 0 10*3/uL (ref 0.0–0.1)
Basophils Relative: 0 %
Eosinophils Absolute: 0 10*3/uL (ref 0.0–0.5)
Eosinophils Relative: 1 %
HCT: 24.1 % — ABNORMAL LOW (ref 36.0–46.0)
Hemoglobin: 8 g/dL — ABNORMAL LOW (ref 12.0–15.0)
Immature Granulocytes: 1 %
Lymphocytes Relative: 46 %
Lymphs Abs: 0.8 10*3/uL (ref 0.7–4.0)
MCH: 36.5 pg — ABNORMAL HIGH (ref 26.0–34.0)
MCHC: 33.2 g/dL (ref 30.0–36.0)
MCV: 110 fL — ABNORMAL HIGH (ref 80.0–100.0)
Monocytes Absolute: 0.4 10*3/uL (ref 0.1–1.0)
Monocytes Relative: 21 %
Neutro Abs: 0.6 10*3/uL — ABNORMAL LOW (ref 1.7–7.7)
Neutrophils Relative %: 31 %
Platelet Count: 110 10*3/uL — ABNORMAL LOW (ref 150–400)
RBC: 2.19 MIL/uL — ABNORMAL LOW (ref 3.87–5.11)
RDW: 17.2 % — ABNORMAL HIGH (ref 11.5–15.5)
WBC Count: 1.8 10*3/uL — ABNORMAL LOW (ref 4.0–10.5)
nRBC: 2.2 % — ABNORMAL HIGH (ref 0.0–0.2)

## 2018-06-28 LAB — COMPREHENSIVE METABOLIC PANEL
ALT: 24 U/L (ref 0–44)
AST: 21 U/L (ref 15–41)
Albumin: 3.3 g/dL — ABNORMAL LOW (ref 3.5–5.0)
Alkaline Phosphatase: 135 U/L — ABNORMAL HIGH (ref 38–126)
Anion gap: 22 — ABNORMAL HIGH (ref 5–15)
BUN: 6 mg/dL (ref 6–20)
CO2: 15 mmol/L — ABNORMAL LOW (ref 22–32)
Calcium: 8.6 mg/dL — ABNORMAL LOW (ref 8.9–10.3)
Chloride: 98 mmol/L (ref 98–111)
Creatinine, Ser: 0.65 mg/dL (ref 0.44–1.00)
GFR calc Af Amer: >60 mL/min (ref >60)
GFR calc non Af Amer: >60 mL/min (ref >60)
Glucose, Bld: 134 mg/dL — ABNORMAL HIGH (ref 70–99)
Potassium: 3.4 mmol/L — ABNORMAL LOW (ref 3.5–5.1)
Sodium: 135 mmol/L (ref 135–145)
Total Bilirubin: 0.5 mg/dL (ref 0.3–1.2)
Total Protein: 7.3 g/dL (ref 6.5–8.1)

## 2018-06-28 LAB — MAGNESIUM: Magnesium: 1 mg/dL — CL (ref 1.7–2.4)

## 2018-06-28 NOTE — Telephone Encounter (Signed)
Received call report from [place].  "Today's Mg+ = 1.0."   Secure Chat sent with results.

## 2018-07-06 ENCOUNTER — Other Ambulatory Visit: Payer: BC Managed Care – PPO

## 2018-07-11 ENCOUNTER — Inpatient Hospital Stay: Payer: BC Managed Care – PPO

## 2018-07-11 ENCOUNTER — Other Ambulatory Visit: Payer: Self-pay

## 2018-07-11 DIAGNOSIS — C561 Malignant neoplasm of right ovary: Secondary | ICD-10-CM | POA: Diagnosis not present

## 2018-07-11 DIAGNOSIS — C569 Malignant neoplasm of unspecified ovary: Secondary | ICD-10-CM

## 2018-07-11 LAB — COMPREHENSIVE METABOLIC PANEL
ALT: 26 U/L (ref 0–44)
AST: 22 U/L (ref 15–41)
Albumin: 3.8 g/dL (ref 3.5–5.0)
Alkaline Phosphatase: 127 U/L — ABNORMAL HIGH (ref 38–126)
Anion gap: 18 — ABNORMAL HIGH (ref 5–15)
BUN: 14 mg/dL (ref 6–20)
CO2: 19 mmol/L — ABNORMAL LOW (ref 22–32)
Calcium: 8.9 mg/dL (ref 8.9–10.3)
Chloride: 98 mmol/L (ref 98–111)
Creatinine, Ser: 0.73 mg/dL (ref 0.44–1.00)
GFR calc Af Amer: >60 mL/min (ref >60)
GFR calc non Af Amer: >60 mL/min (ref >60)
Glucose, Bld: 165 mg/dL — ABNORMAL HIGH (ref 70–99)
Potassium: 3.5 mmol/L (ref 3.5–5.1)
Sodium: 135 mmol/L (ref 135–145)
Total Bilirubin: 0.4 mg/dL (ref 0.3–1.2)
Total Protein: 8 g/dL (ref 6.5–8.1)

## 2018-07-11 LAB — CBC WITH DIFFERENTIAL (CANCER CENTER ONLY)
Abs Immature Granulocytes: 0.04 10*3/uL (ref 0.00–0.07)
Basophils Absolute: 0 10*3/uL (ref 0.0–0.1)
Basophils Relative: 0 %
Eosinophils Absolute: 0 10*3/uL (ref 0.0–0.5)
Eosinophils Relative: 0 %
HCT: 28.4 % — ABNORMAL LOW (ref 36.0–46.0)
Hemoglobin: 9.6 g/dL — ABNORMAL LOW (ref 12.0–15.0)
Immature Granulocytes: 1 %
Lymphocytes Relative: 18 %
Lymphs Abs: 1.2 10*3/uL (ref 0.7–4.0)
MCH: 36.9 pg — ABNORMAL HIGH (ref 26.0–34.0)
MCHC: 33.8 g/dL (ref 30.0–36.0)
MCV: 109.2 fL — ABNORMAL HIGH (ref 80.0–100.0)
Monocytes Absolute: 0.6 10*3/uL (ref 0.1–1.0)
Monocytes Relative: 8 %
Neutro Abs: 5 10*3/uL (ref 1.7–7.7)
Neutrophils Relative %: 73 %
Platelet Count: 88 10*3/uL — ABNORMAL LOW (ref 150–400)
RBC: 2.6 MIL/uL — ABNORMAL LOW (ref 3.87–5.11)
RDW: 16.3 % — ABNORMAL HIGH (ref 11.5–15.5)
WBC Count: 6.9 10*3/uL (ref 4.0–10.5)
nRBC: 0.6 % — ABNORMAL HIGH (ref 0.0–0.2)

## 2018-07-11 LAB — MAGNESIUM: Magnesium: 1.1 mg/dL — CL (ref 1.7–2.4)

## 2018-07-12 ENCOUNTER — Other Ambulatory Visit: Payer: Self-pay

## 2018-07-12 DIAGNOSIS — C563 Malignant neoplasm of bilateral ovaries: Secondary | ICD-10-CM

## 2018-07-12 DIAGNOSIS — C561 Malignant neoplasm of right ovary: Secondary | ICD-10-CM

## 2018-07-12 DIAGNOSIS — C569 Malignant neoplasm of unspecified ovary: Secondary | ICD-10-CM

## 2018-07-15 ENCOUNTER — Other Ambulatory Visit: Payer: Self-pay

## 2018-07-15 ENCOUNTER — Telehealth: Payer: Self-pay

## 2018-07-15 ENCOUNTER — Inpatient Hospital Stay: Payer: BC Managed Care – PPO

## 2018-07-15 DIAGNOSIS — C561 Malignant neoplasm of right ovary: Secondary | ICD-10-CM

## 2018-07-15 DIAGNOSIS — C563 Malignant neoplasm of bilateral ovaries: Secondary | ICD-10-CM

## 2018-07-15 DIAGNOSIS — C569 Malignant neoplasm of unspecified ovary: Secondary | ICD-10-CM

## 2018-07-15 LAB — MAGNESIUM: Magnesium: 1.2 mg/dL — CL (ref 1.7–2.4)

## 2018-07-15 LAB — PLATELET COUNT (CANCER CENTER ONLY): Platelet Count: 133 10*3/uL — ABNORMAL LOW (ref 150–400)

## 2018-07-15 NOTE — Telephone Encounter (Signed)
Faxed results to Dr. Elenora Gamma nurse at [facility] for review.

## 2018-07-18 ENCOUNTER — Encounter: Admit: 2018-07-18 | Discharge: 2018-07-19 | Payer: BLUE CROSS/BLUE SHIELD

## 2018-07-18 DIAGNOSIS — Z5111 Encounter for antineoplastic chemotherapy: Secondary | ICD-10-CM

## 2018-07-18 DIAGNOSIS — C561 Malignant neoplasm of right ovary: Secondary | ICD-10-CM

## 2018-07-20 ENCOUNTER — Other Ambulatory Visit: Payer: BC Managed Care – PPO

## 2018-07-27 ENCOUNTER — Ambulatory Visit: Payer: BC Managed Care – PPO

## 2018-07-28 ENCOUNTER — Encounter (HOSPITAL_COMMUNITY): Payer: Self-pay | Admitting: Adult Health

## 2018-08-01 ENCOUNTER — Other Ambulatory Visit: Payer: Self-pay

## 2018-08-01 ENCOUNTER — Telehealth: Payer: Self-pay | Admitting: *Deleted

## 2018-08-01 ENCOUNTER — Other Ambulatory Visit: Admit: 2018-08-01 | Discharge: 2018-08-01 | Payer: BLUE CROSS/BLUE SHIELD

## 2018-08-01 ENCOUNTER — Ambulatory Visit
Admit: 2018-08-01 | Discharge: 2018-08-01 | Payer: BLUE CROSS/BLUE SHIELD | Attending: Gynecologic Oncology | Primary: Gynecologic Oncology

## 2018-08-01 ENCOUNTER — Encounter: Admit: 2018-08-01 | Discharge: 2018-08-01 | Payer: BLUE CROSS/BLUE SHIELD

## 2018-08-01 ENCOUNTER — Encounter
Admit: 2018-08-01 | Discharge: 2018-08-01 | Payer: BLUE CROSS/BLUE SHIELD | Attending: Gynecologic Oncology | Primary: Gynecologic Oncology

## 2018-08-01 DIAGNOSIS — C563 Malignant neoplasm of bilateral ovaries: Secondary | ICD-10-CM

## 2018-08-01 DIAGNOSIS — C561 Malignant neoplasm of right ovary: Secondary | ICD-10-CM

## 2018-08-01 DIAGNOSIS — Z1371 Encounter for nonprocreative screening for genetic disease carrier status: Secondary | ICD-10-CM

## 2018-08-01 DIAGNOSIS — Z5111 Encounter for antineoplastic chemotherapy: Secondary | ICD-10-CM

## 2018-08-01 MED ORDER — LETROZOLE 2.5 MG TABLET
ORAL_TABLET | Freq: Every day | ORAL | 0 refills | 90.00000 days | Status: CP
Start: 2018-08-01 — End: 2018-10-30

## 2018-08-01 NOTE — Telephone Encounter (Signed)
Per [facility] APP I have scheduled the patient for a lab appt on Wednesday 7/15 at 2:30 pm. Called and left the patient a message with the information

## 2018-08-03 ENCOUNTER — Telehealth: Payer: Self-pay

## 2018-08-03 ENCOUNTER — Inpatient Hospital Stay: Payer: BC Managed Care – PPO | Attending: Gynecologic Oncology

## 2018-08-03 ENCOUNTER — Other Ambulatory Visit: Payer: Self-pay

## 2018-08-03 DIAGNOSIS — C561 Malignant neoplasm of right ovary: Secondary | ICD-10-CM | POA: Diagnosis not present

## 2018-08-03 DIAGNOSIS — C562 Malignant neoplasm of left ovary: Secondary | ICD-10-CM | POA: Insufficient documentation

## 2018-08-03 DIAGNOSIS — C569 Malignant neoplasm of unspecified ovary: Secondary | ICD-10-CM

## 2018-08-03 LAB — CBC WITH DIFFERENTIAL (CANCER CENTER ONLY)
Abs Immature Granulocytes: 0.01 10*3/uL (ref 0.00–0.07)
Basophils Absolute: 0 10*3/uL (ref 0.0–0.1)
Basophils Relative: 0 %
Eosinophils Absolute: 0 10*3/uL (ref 0.0–0.5)
Eosinophils Relative: 1 %
HCT: 25.7 % — ABNORMAL LOW (ref 36.0–46.0)
Hemoglobin: 8.8 g/dL — ABNORMAL LOW (ref 12.0–15.0)
Immature Granulocytes: 0 %
Lymphocytes Relative: 30 %
Lymphs Abs: 0.8 10*3/uL (ref 0.7–4.0)
MCH: 35.8 pg — ABNORMAL HIGH (ref 26.0–34.0)
MCHC: 34.2 g/dL (ref 30.0–36.0)
MCV: 104.5 fL — ABNORMAL HIGH (ref 80.0–100.0)
Monocytes Absolute: 0.4 10*3/uL (ref 0.1–1.0)
Monocytes Relative: 15 %
Neutro Abs: 1.5 10*3/uL — ABNORMAL LOW (ref 1.7–7.7)
Neutrophils Relative %: 54 %
Platelet Count: 39 10*3/uL — ABNORMAL LOW (ref 150–400)
RBC: 2.46 MIL/uL — ABNORMAL LOW (ref 3.87–5.11)
RDW: 14.6 % (ref 11.5–15.5)
WBC Count: 2.8 10*3/uL — ABNORMAL LOW (ref 4.0–10.5)
WBC Morphology: 1
nRBC: 0.7 % — ABNORMAL HIGH (ref 0.0–0.2)

## 2018-08-03 LAB — COMPREHENSIVE METABOLIC PANEL
ALT: 27 U/L (ref 0–44)
AST: 20 U/L (ref 15–41)
Albumin: 3.7 g/dL (ref 3.5–5.0)
Alkaline Phosphatase: 163 U/L — ABNORMAL HIGH (ref 38–126)
Anion gap: 15 (ref 5–15)
BUN: 15 mg/dL (ref 6–20)
CO2: 23 mmol/L (ref 22–32)
Calcium: 9.2 mg/dL (ref 8.9–10.3)
Chloride: 98 mmol/L (ref 98–111)
Creatinine, Ser: 0.75 mg/dL (ref 0.44–1.00)
GFR calc Af Amer: >60 mL/min (ref >60)
GFR calc non Af Amer: >60 mL/min (ref >60)
Glucose, Bld: 201 mg/dL — ABNORMAL HIGH (ref 70–99)
Potassium: 3.9 mmol/L (ref 3.5–5.1)
Sodium: 136 mmol/L (ref 135–145)
Total Bilirubin: 0.7 mg/dL (ref 0.3–1.2)
Total Protein: 7.7 g/dL (ref 6.5–8.1)

## 2018-08-03 LAB — MAGNESIUM: Magnesium: 1.1 mg/dL — CL (ref 1.7–2.4)

## 2018-08-03 NOTE — Telephone Encounter (Signed)
Results faxed to [facility] as requested.

## 2018-08-04 ENCOUNTER — Telehealth: Payer: Self-pay | Admitting: *Deleted

## 2018-08-04 NOTE — Telephone Encounter (Signed)
Called and left the patient a message with her lab appt for Friday.

## 2018-08-05 ENCOUNTER — Telehealth: Payer: Self-pay | Admitting: *Deleted

## 2018-08-05 ENCOUNTER — Other Ambulatory Visit: Payer: Self-pay

## 2018-08-05 ENCOUNTER — Other Ambulatory Visit: Payer: Self-pay | Admitting: Gynecologic Oncology

## 2018-08-05 ENCOUNTER — Inpatient Hospital Stay: Payer: BC Managed Care – PPO

## 2018-08-05 DIAGNOSIS — C561 Malignant neoplasm of right ovary: Secondary | ICD-10-CM | POA: Diagnosis not present

## 2018-08-05 DIAGNOSIS — C563 Malignant neoplasm of bilateral ovaries: Secondary | ICD-10-CM

## 2018-08-05 DIAGNOSIS — C569 Malignant neoplasm of unspecified ovary: Secondary | ICD-10-CM

## 2018-08-05 LAB — CBC WITH DIFFERENTIAL (CANCER CENTER ONLY)
Abs Immature Granulocytes: 0.04 10*3/uL (ref 0.00–0.07)
Basophils Absolute: 0 10*3/uL (ref 0.0–0.1)
Basophils Relative: 0 %
Eosinophils Absolute: 0 10*3/uL (ref 0.0–0.5)
Eosinophils Relative: 1 %
HCT: 26.1 % — ABNORMAL LOW (ref 36.0–46.0)
Hemoglobin: 8.9 g/dL — ABNORMAL LOW (ref 12.0–15.0)
Immature Granulocytes: 1 %
Lymphocytes Relative: 26 %
Lymphs Abs: 1 10*3/uL (ref 0.7–4.0)
MCH: 36 pg — ABNORMAL HIGH (ref 26.0–34.0)
MCHC: 34.1 g/dL (ref 30.0–36.0)
MCV: 105.7 fL — ABNORMAL HIGH (ref 80.0–100.0)
Monocytes Absolute: 0.5 10*3/uL (ref 0.1–1.0)
Monocytes Relative: 12 %
Neutro Abs: 2.3 10*3/uL (ref 1.7–7.7)
Neutrophils Relative %: 60 %
Platelet Count: 42 10*3/uL — ABNORMAL LOW (ref 150–400)
RBC: 2.47 MIL/uL — ABNORMAL LOW (ref 3.87–5.11)
RDW: 14.6 % (ref 11.5–15.5)
WBC Count: 3.9 10*3/uL — ABNORMAL LOW (ref 4.0–10.5)
nRBC: 1 % — ABNORMAL HIGH (ref 0.0–0.2)

## 2018-08-05 NOTE — Progress Notes (Signed)
CBC with diff for Tues, July 21 per [facility]

## 2018-08-05 NOTE — Telephone Encounter (Signed)
Per [facility] APP called and scheduled the patient for a lab appt on 7/21

## 2018-08-09 ENCOUNTER — Other Ambulatory Visit: Payer: Self-pay

## 2018-08-09 ENCOUNTER — Inpatient Hospital Stay: Payer: BC Managed Care – PPO

## 2018-08-09 DIAGNOSIS — C561 Malignant neoplasm of right ovary: Secondary | ICD-10-CM | POA: Diagnosis not present

## 2018-08-09 DIAGNOSIS — C563 Malignant neoplasm of bilateral ovaries: Secondary | ICD-10-CM

## 2018-08-09 LAB — CBC WITH DIFFERENTIAL (CANCER CENTER ONLY)
Abs Immature Granulocytes: 0.03 10*3/uL (ref 0.00–0.07)
Basophils Absolute: 0 10*3/uL (ref 0.0–0.1)
Basophils Relative: 0 %
Eosinophils Absolute: 0 10*3/uL (ref 0.0–0.5)
Eosinophils Relative: 0 %
HCT: 26.6 % — ABNORMAL LOW (ref 36.0–46.0)
Hemoglobin: 8.7 g/dL — ABNORMAL LOW (ref 12.0–15.0)
Immature Granulocytes: 1 %
Lymphocytes Relative: 23 %
Lymphs Abs: 1.2 10*3/uL (ref 0.7–4.0)
MCH: 35.8 pg — ABNORMAL HIGH (ref 26.0–34.0)
MCHC: 32.7 g/dL (ref 30.0–36.0)
MCV: 109.5 fL — ABNORMAL HIGH (ref 80.0–100.0)
Monocytes Absolute: 0.6 10*3/uL (ref 0.1–1.0)
Monocytes Relative: 11 %
Neutro Abs: 3.3 10*3/uL (ref 1.7–7.7)
Neutrophils Relative %: 65 %
Platelet Count: 56 10*3/uL — ABNORMAL LOW (ref 150–400)
RBC: 2.43 MIL/uL — ABNORMAL LOW (ref 3.87–5.11)
RDW: 15.5 % (ref 11.5–15.5)
WBC Count: 5.1 10*3/uL (ref 4.0–10.5)
nRBC: 0.4 % — ABNORMAL HIGH (ref 0.0–0.2)

## 2018-08-11 ENCOUNTER — Encounter
Admit: 2018-08-11 | Discharge: 2018-08-12 | Payer: BLUE CROSS/BLUE SHIELD | Attending: Physician Assistant | Primary: Physician Assistant

## 2018-08-11 DIAGNOSIS — E669 Obesity, unspecified: Secondary | ICD-10-CM

## 2018-08-11 DIAGNOSIS — C561 Malignant neoplasm of right ovary: Secondary | ICD-10-CM

## 2018-08-11 DIAGNOSIS — Z5111 Encounter for antineoplastic chemotherapy: Secondary | ICD-10-CM

## 2018-08-15 ENCOUNTER — Other Ambulatory Visit: Payer: Self-pay | Admitting: Gynecologic Oncology

## 2018-08-15 ENCOUNTER — Telehealth: Payer: Self-pay | Admitting: *Deleted

## 2018-08-15 ENCOUNTER — Encounter
Admit: 2018-08-15 | Discharge: 2018-08-16 | Payer: BLUE CROSS/BLUE SHIELD | Attending: Registered" | Primary: Registered"

## 2018-08-15 DIAGNOSIS — C561 Malignant neoplasm of right ovary: Secondary | ICD-10-CM

## 2018-08-15 DIAGNOSIS — C563 Malignant neoplasm of bilateral ovaries: Secondary | ICD-10-CM

## 2018-08-15 DIAGNOSIS — E669 Obesity, unspecified: Principal | ICD-10-CM

## 2018-08-15 NOTE — Telephone Encounter (Signed)
Per [facility] APP called and scheduled patient for lab work on Wednesday

## 2018-08-17 ENCOUNTER — Other Ambulatory Visit: Payer: Self-pay

## 2018-08-17 ENCOUNTER — Inpatient Hospital Stay: Payer: BC Managed Care – PPO

## 2018-08-17 DIAGNOSIS — C561 Malignant neoplasm of right ovary: Secondary | ICD-10-CM

## 2018-08-17 DIAGNOSIS — C563 Malignant neoplasm of bilateral ovaries: Secondary | ICD-10-CM

## 2018-08-17 LAB — CBC WITH DIFFERENTIAL (CANCER CENTER ONLY)
Abs Immature Granulocytes: 0.01 10*3/uL (ref 0.00–0.07)
Basophils Absolute: 0 10*3/uL (ref 0.0–0.1)
Basophils Relative: 0 %
Eosinophils Absolute: 0 10*3/uL (ref 0.0–0.5)
Eosinophils Relative: 1 %
HCT: 25.4 % — ABNORMAL LOW (ref 36.0–46.0)
Hemoglobin: 8.3 g/dL — ABNORMAL LOW (ref 12.0–15.0)
Immature Granulocytes: 0 %
Lymphocytes Relative: 25 %
Lymphs Abs: 1.3 10*3/uL (ref 0.7–4.0)
MCH: 36.6 pg — ABNORMAL HIGH (ref 26.0–34.0)
MCHC: 32.7 g/dL (ref 30.0–36.0)
MCV: 111.9 fL — ABNORMAL HIGH (ref 80.0–100.0)
Monocytes Absolute: 0.6 10*3/uL (ref 0.1–1.0)
Monocytes Relative: 10 %
Neutro Abs: 3.4 10*3/uL (ref 1.7–7.7)
Neutrophils Relative %: 64 %
Platelet Count: 108 10*3/uL — ABNORMAL LOW (ref 150–400)
RBC: 2.27 MIL/uL — ABNORMAL LOW (ref 3.87–5.11)
RDW: 17.3 % — ABNORMAL HIGH (ref 11.5–15.5)
WBC Count: 5.3 10*3/uL (ref 4.0–10.5)
nRBC: 0.6 % — ABNORMAL HIGH (ref 0.0–0.2)

## 2018-08-17 MED ORDER — METFORMIN 500 MG TABLET
ORAL_TABLET | 2 refills | 0 days | Status: CP
Start: 2018-08-17 — End: ?

## 2018-09-28 ENCOUNTER — Telehealth
Admit: 2018-09-28 | Discharge: 2018-09-29 | Payer: BLUE CROSS/BLUE SHIELD | Attending: Physician Assistant | Primary: Physician Assistant

## 2018-09-28 DIAGNOSIS — E669 Obesity, unspecified: Secondary | ICD-10-CM

## 2018-09-28 MED ORDER — METFORMIN 500 MG TABLET
ORAL_TABLET | 2 refills | 0 days | Status: CP
Start: 2018-09-28 — End: ?

## 2018-09-28 MED ORDER — ALBUTEROL SULFATE HFA 90 MCG/ACTUATION AEROSOL INHALER
RESPIRATORY_TRACT | 0 refills | 0.00000 days | Status: CP | PRN
Start: 2018-09-28 — End: ?

## 2018-10-17 ENCOUNTER — Telehealth: Payer: Self-pay | Admitting: Cardiology

## 2018-10-17 NOTE — Telephone Encounter (Signed)
   [facility] Pre-operative Risk Assessment    Request for surgical clearance:  1. What type of surgery is being performed? Cytoreductive surgery for ovarian cancer  2. When is this surgery scheduled? TBD - tentative Oct 2020   3. What type of clearance is required (medical clearance vs. Pharmacy clearance to hold med vs. Both)? None specified - on ASA & Effient  4. Are there any medications that need to be held prior to surgery and how long? None specified   5. Practice name and name of physician performing surgery? Nancy Marus, MD @ [facility]  6. What is your office phone number [phone]    7.   What is your office fax number [phone]  8.   Anesthesia type (None, local, MAC, general) ? Not specified    Shelley Vega 10/17/2018, 1:58 PM  _________________________________________________________________   (provider comments below)

## 2018-10-17 NOTE — Telephone Encounter (Signed)
Dr. Percival Spanish Pt has a history of CAD with NSTEMI in 2015, with 99% thrombotic occlusion of the LAD at the bifurcation requiring overlapping Premier stents with aspiration thrombectomy, RCA had 60% mid stenosis with normal EF.   Request hold for ASA and effient for GYN surgery for her ovarian cancer.

## 2018-10-18 NOTE — Telephone Encounter (Signed)
It sounds like she would need to hold the Effient.  I would suggest only holding the ASA as well only if they cannot do the surgery because the risk is prohibitive.  She is high risk if she is taken off of both antiplatelets.

## 2018-10-19 NOTE — Telephone Encounter (Signed)
Left VM

## 2018-10-20 DIAGNOSIS — R000 Tachycardia, unspecified: Secondary | ICD-10-CM | POA: Insufficient documentation

## 2018-10-20 NOTE — Progress Notes (Signed)
Cardiology Office Note   Date:  10/21/2018   ID:  Shelley Vega, DOB February 02, 1980, MRN [id number]  PCP:  [place], Betty G, MD  Cardiologist:   Minus Breeding, MD    No chief complaint on file.     History of Present Illness: Shelley Vega is a 38 y.o. female who presents for follow up of CAD.  She had a NSTEMI in 2015 at was managed at [facility]. She had a 99% thrombotic occlusion of a LAD at a bifurcation and required to overlapping Premier stents with aspiration thrombectomy.  OM had minor irregularities.    The RCA had 60% mid stenosis.  She had a normal EF.  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  She is to have cytoreductive surgery for ovarian cancer.   Since I last saw her the patient has no new cardiovascular complaints.  He is unfortunately failed therapy with trametinib, letrozole he was also treated with Taxol and carboplatin.  She needs abdominal surgery and the question is can she come off of her aspirin and Effient.  She has been on dual antiplatelet therapy because of her previous presentation with overlapping stents with a high thrombus burden several years ago.  However, with her recent problems and surgery she has been off both agents successfully for surgeries.  She says she is doing well.  She denies any chest pressure, neck or arm discomfort.  She has had no shortness of breath, PND or orthopnea.  She exercises on a treadmill.   Past Medical History:  Diagnosis Date  . ADHD   . Anxiety   . Aortic atherosclerosis ([place])   . Depression   . History of non-ST elevation myocardial infarction (NSTEMI) 05/09/2013   s/p  PCI and DES x2  . Hypertension   . Left ureteral stone   . Malignant neoplasm of unspecified ovary ([place]) onologist-  dr Alycia Rossetti ([facility] [place])--- per lov note 08-19-2016  no recurrence   dx 07-13-2014  right ovarian high grade serous carcinoma, Stage IIIA2,  chemotherapy 08-13-2014 to  09-26-2014;  07-19-2015 recurrence w/ left ovarian high grade serous carcinoma, chemotherpary 08-12-2015 to 12-16-2015  . Nephrolithiasis    bilateral per dr bell note (urologist)  . S/P drug eluting coronary stent placement 05-09-2013   [facility]   DES to prox. and mid LAD  overlapping 2 stents  . Ventral hernia     Past Surgical History:  Procedure Laterality Date  . ABDOMINAL HYSTERECTOMY    . BREAST REDUCTION SURGERY    . CORONARY ANGIOPLASTY WITH STENT PLACEMENT  05-09-2013   dr Gwynneth Albright [facility] [place])   PCI and DES x2 to prox. and mid LAD, overlapping (PREMIER);  60% mRCA treated medically  . CYSTOSCOPY WITH RETROGRADE PYELOGRAM, URETEROSCOPY AND STENT PLACEMENT Left 09/23/2016   Procedure: CYSTOSCOPY WITH LEFT  RETROGRADE PYELOGRAM, LEFT URETEROSCOPY LASER LITHOREIPSY AND STENT PLACEMENT;  Surgeon: Lucas Mallow, MD;  Location: [facility];  Service: Urology;  Laterality: Left;  . DILATION AND CURETTAGE OF UTERUS  11-09-2014  dr Alycia Rossetti [facility]   and placement IUD  . EXPLORATORY LAPAROTOMY/  RIGHT SALPINGOOPHORECTOMY/ RIGHT URETEROLYSIS/ INFRACOLIC OMENTECTOMY/ RIGHT PELVIC AND AORTIC LYMPH NODE DISSECTION'S/ STAGING BX'S  07-13-2014   dr Alycia Rossetti  [facility])  . HOLMIUM LASER APPLICATION Left 0000000   Procedure: HOLMIUM LASER APPLICATION;  Surgeon: Lucas Mallow, MD;  Location: [facility]  [facility];  Service: Urology;  Laterality: Left;  . MOUTH SURGERY    . ROBOTIC ASSISTED TOTAL HYSTERECTOMY  07-19-2015   dr Alycia Rossetti  [facility]   Left Salpinoophorectomy  . TRANSTHORACIC ECHOCARDIOGRAM     ef 55-60%/  mild MR and TR/  trivial PR     Current Outpatient Medications  Medication Sig Dispense Refill  . albuterol (PROVENTIL HFA;VENTOLIN HFA) 108 (90 Base) MCG/ACT inhaler Inhale 2 puffs into the lungs every 6 (six) hours as needed for wheezing or shortness of breath. 1 Inhaler 0  . ALPRAZolam (XANAX) 0.5 MG tablet Take  0.5 mg by mouth 3 (three) times daily as needed for anxiety.   3  . amphetamine-dextroamphetamine (ADDERALL XR) 20 MG 24 hr capsule Take 20 mg by mouth daily.     Marland Kitchen amphetamine-dextroamphetamine (ADDERALL) 30 MG tablet Take 30 mg by mouth daily.     Marland Kitchen aspirin 81 MG chewable tablet Chew 81 mg by mouth daily with breakfast.     . atorvastatin (LIPITOR) 40 MG tablet TAKE 1 TABLET(40 MG) BY MOUTH AT BEDTIME (Patient taking differently: Take 40 mg by mouth daily. ) 90 tablet 3  . cetirizine (ZYRTEC) 10 MG tablet Take 10 mg by mouth daily with breakfast.     . desvenlafaxine (PRISTIQ) 100 MG 24 hr tablet Take 100 mg by mouth daily.    Marland Kitchen letrozole (FEMARA) 2.5 MG tablet Take 2.5 mg by mouth daily.    Marland Kitchen losartan (COZAAR) 100 MG tablet 1 TAB DAILY-NOTE NEW DOSE (Patient taking differently: Take 100 mg by mouth daily. ) 90 tablet 3  . MAGNESIUM-OXIDE 400 (241.3 Mg) MG tablet Take 400 mg by mouth 4 (four) times daily.     . metoprolol succinate (TOPROL-XL) 100 MG 24 hr tablet TAKE 1 TABLET BY MOUTH EVERY DAY WITH BREAKFAST 90 tablet 3  . Multiple Vitamin (MULTIVITAMIN WITH MINERALS) TABS tablet Take 1 tablet by mouth daily with breakfast.     . nitroGLYCERIN (NITROSTAT) 0.4 MG SL tablet Place 0.4 mg under the tongue every 5 (five) minutes as needed for chest pain.    . prasugrel (EFFIENT) 10 MG TABS tablet TAKE 1 TABLET(10 MG) BY MOUTH DAILY WITH BREAKFAST 90 tablet 3  . REXULTI 1 MG TABS Take 1 tablet by mouth at bedtime.      No current facility-administered medications for this visit.    Facility-Administered Medications Ordered in Other Visits  Medication Dose Route Frequency Provider Last Rate Last Dose  . sodium chloride flush (NS) 0.9 % injection 10 mL  10 mL Intravenous PRN Nicholas Lose, MD   10 mL at 12/30/15 1022    Allergies:   Lisinopril    ROS:  Please see the history of present illness.   Otherwise, review of systems are positive for none.   All other systems are reviewed and negative.     PHYSICAL EXAM: VS:  BP 128/87   Pulse 92   Temp (!) 97.1 F (36.2 C)   Ht 5\' 3"  (1.6 m)   Wt 211 lb (95.7 kg)   LMP 08/27/2014   SpO2 97%   BMI 37.38 kg/m  , BMI Body mass index is 37.38 kg/m.  GENERAL:  Well appearing NECK:  No jugular venous distention, waveform within normal limits, carotid upstroke brisk and symmetric, no bruits, no thyromegaly LUNGS:  Clear to auscultation bilaterally CHEST:  Unremarkable HEART:  PMI not displaced or sustained,S1 and S2 within normal limits, no S3, no S4, no clicks, no rubs,  no murmurs ABD:  Flat, positive bowel sounds normal in frequency in pitch, no bruits, no rebound, no guarding, no midline pulsatile mass, no hepatomegaly, no splenomegaly EXT:  2 plus pulses throughout, no edema, no cyanosis no clubbing   EKG:  EKG not ordered today. The ekg ordered and demonstrates sinus tachycardia, rate 80, poor R wave progression, QTC slightly prolonged, no acute ST-T wave changes.    Lab Results  Component Value Date   CHOL 107 (L) 10/15/2014   TRIG 239 (H) 10/15/2014   HDL 24 (L) 10/15/2014   LDLCALC 35 10/15/2014   Recent Labs: 08/03/2018: ALT 27; BUN 15; Creatinine, Ser 0.75; Magnesium 1.1; Potassium 3.9; Sodium 136 08/17/2018: Hemoglobin 8.3; Platelet Count 108      Wt Readings from Last 3 Encounters:  10/21/18 211 lb (95.7 kg)  02/24/18 210 lb (95.3 kg)  01/18/18 211 lb (95.7 kg)      Other studies Reviewed: Additional studies/ records that were reviewed today include: Office records from [facility] Review of the above records demonstrates:  See above   ASSESSMENT AND PLAN:  CAD:    I did discuss this with the patient.  I prefer she stay on aspirin if at all possible with surgery but if this is thought to be prohibitive she is given instructions to stop this 5 days before surgery.  She can stop the Effient 7 days before surgery.  She should restart these when it is felt to be safe from a surgical standpoint.  I will try to  get in contact with her surgeons to further understand whether it would be safe for her to continue the aspirin.  Of note that was the instruction she was given previously although she tells me that both agents were held with her last surgery.  We did put a phone call into their office today but they were not available.  PREOP: The patient has a high functional level.  She has no significant symptoms.  She is at somewhat intermediate risk given her significant past history but at this point I would not suggest further testing is indicated and she would not be at prohibitive risk for what sounds like it would be very necessary surgery.   HTN:  The blood pressure is at target.  No change in therapy.   DYSLIPIDEMIA:    She will continue on the meds as listed.  Once she is recovered from her surgery and is removed from this I would like to see a lipid profile and will manage as needed.  TACHYCARDIA:   This was a problem previously.  She is not complaining of this any longer.  No change in therapy.  Current medicines are reviewed at length with the patient today.  The patient does not have concerns regarding medicines.  The following changes have been made:  None  Labs/ tests ordered today include: None  No orders of the defined types were placed in this encounter.    Disposition:   FU with me in one year or sooner if needed.   Signed, Minus Breeding, MD  10/21/2018 11:09 AM    [facility]

## 2018-10-21 ENCOUNTER — Ambulatory Visit (INDEPENDENT_AMBULATORY_CARE_PROVIDER_SITE_OTHER): Payer: BC Managed Care – PPO | Admitting: Cardiology

## 2018-10-21 ENCOUNTER — Encounter: Payer: Self-pay | Admitting: Cardiology

## 2018-10-21 ENCOUNTER — Other Ambulatory Visit: Payer: Self-pay

## 2018-10-21 VITALS — BP 128/87 | HR 92 | Temp 97.1°F | Ht 63.0 in | Wt 211.0 lb

## 2018-10-21 DIAGNOSIS — E785 Hyperlipidemia, unspecified: Secondary | ICD-10-CM

## 2018-10-21 DIAGNOSIS — I10 Essential (primary) hypertension: Secondary | ICD-10-CM | POA: Diagnosis not present

## 2018-10-21 DIAGNOSIS — R000 Tachycardia, unspecified: Secondary | ICD-10-CM

## 2018-10-21 DIAGNOSIS — I2510 Atherosclerotic heart disease of native coronary artery without angina pectoris: Secondary | ICD-10-CM | POA: Diagnosis not present

## 2018-10-21 DIAGNOSIS — Z01810 Encounter for preprocedural cardiovascular examination: Secondary | ICD-10-CM

## 2018-10-21 NOTE — Patient Instructions (Signed)

## 2018-10-25 NOTE — Telephone Encounter (Signed)
   Primary Cardiologist: Minus Breeding, MD  Chart reviewed as part of pre-operative protocol coverage. Patient was contacted 10/25/2018 in reference to pre-operative risk assessment for pending surgery as outlined below.  Shelley Vega was last seen on 10/21/2018 by Dr. Percival Spanish.  It was preferred that the patient remain on ASA if at all possible however if thought to be prohibitive, may stop ASA 5 days prior to surgery. She was instructed to stop the Effient 7 days before surgery. She should restart these when it is felt safe from a surgical standpoint. Dr. Percival Spanish attempted to contact the surgeons office to understand if it would be safe for her to stay on ASA.    Although thought to be be somewhat of an intermediate risk for surgery given her significant past history, no further cardiac testing was recommended.    I will route this recommendation to the requesting party via Epic fax function and remove from pre-op pool.  Please call with questions.  Kathyrn Drown, NP 10/25/2018, 10:08 AM

## 2018-10-28 DIAGNOSIS — C561 Malignant neoplasm of right ovary: Secondary | ICD-10-CM

## 2018-10-28 DIAGNOSIS — Z5111 Encounter for antineoplastic chemotherapy: Secondary | ICD-10-CM

## 2018-10-28 MED ORDER — LETROZOLE 2.5 MG TABLET
ORAL_TABLET | Freq: Every day | ORAL | 0 refills | 90.00000 days | Status: CP
Start: 2018-10-28 — End: 2019-01-26

## 2018-10-31 ENCOUNTER — Ambulatory Visit: Admit: 2018-10-31 | Discharge: 2018-11-01 | Payer: BLUE CROSS/BLUE SHIELD

## 2018-10-31 ENCOUNTER — Encounter: Admit: 2018-10-31 | Discharge: 2018-11-01 | Payer: BLUE CROSS/BLUE SHIELD

## 2018-10-31 ENCOUNTER — Encounter
Admit: 2018-10-31 | Discharge: 2018-11-01 | Payer: BLUE CROSS/BLUE SHIELD | Attending: Gynecologic Oncology | Primary: Gynecologic Oncology

## 2018-10-31 DIAGNOSIS — C561 Malignant neoplasm of right ovary: Principal | ICD-10-CM

## 2018-11-01 DIAGNOSIS — C569 Malignant neoplasm of unspecified ovary: Principal | ICD-10-CM

## 2018-11-09 ENCOUNTER — Encounter: Admit: 2018-11-09 | Discharge: 2018-11-09 | Payer: BLUE CROSS/BLUE SHIELD | Attending: Family | Primary: Family

## 2018-11-09 ENCOUNTER — Encounter: Admit: 2018-11-09 | Discharge: 2018-11-09 | Payer: BLUE CROSS/BLUE SHIELD

## 2018-11-09 ENCOUNTER — Encounter
Admit: 2018-11-09 | Discharge: 2018-11-09 | Payer: BLUE CROSS/BLUE SHIELD | Attending: Gynecologic Oncology | Primary: Gynecologic Oncology

## 2018-11-09 DIAGNOSIS — I2510 Atherosclerotic heart disease of native coronary artery without angina pectoris: Principal | ICD-10-CM

## 2018-11-09 DIAGNOSIS — C569 Malignant neoplasm of unspecified ovary: Principal | ICD-10-CM

## 2018-11-09 DIAGNOSIS — J45909 Unspecified asthma, uncomplicated: Principal | ICD-10-CM

## 2018-11-09 DIAGNOSIS — Z01818 Encounter for other preprocedural examination: Principal | ICD-10-CM

## 2018-11-09 DIAGNOSIS — I10 Essential (primary) hypertension: Principal | ICD-10-CM

## 2018-11-09 MED ORDER — NEOMYCIN 500 MG TABLET: 1000 mg | tablet | Freq: Three times a day (TID) | 0 refills | 1 days | Status: AC

## 2018-11-09 MED ORDER — ERYTHROMYCIN 500 MG TABLET: 1000 mg | tablet | Freq: Three times a day (TID) | 0 refills | 1 days | Status: AC

## 2018-11-09 MED ORDER — BISACODYL 5 MG TABLET,DELAYED RELEASE: 20 mg | tablet | Freq: Once | 0 refills | 1 days | Status: AC

## 2018-11-09 MED ORDER — POLYETHYLENE GLYCOL 3350 17 GRAM/DOSE ORAL POWDER: 238 g | g | Freq: Once | 0 refills | 1 days | Status: AC

## 2018-11-11 ENCOUNTER — Encounter
Admit: 2018-11-11 | Discharge: 2018-11-15 | Disposition: A | Discharge status: Home / Self Care | Payer: BLUE CROSS/BLUE SHIELD | Attending: Certified Registered"

## 2018-11-11 ENCOUNTER — Ambulatory Visit
Admit: 2018-11-11 | Discharge: 2018-11-15 | Disposition: A | Discharge status: Home / Self Care | Payer: BLUE CROSS/BLUE SHIELD

## 2018-11-15 MED ORDER — OXYCODONE 10 MG TABLET
ORAL_TABLET | ORAL | 0 refills | 4.00000 days | Status: CP | PRN
Start: 2018-11-15 — End: 2018-11-20
  Filled 2018-11-15: qty 20, 4d supply, fill #0

## 2018-11-15 MED ORDER — LIDOCAINE 4 % TOPICAL PATCH
MEDICATED_PATCH | Freq: Every day | TRANSDERMAL | 0 refills | 10.00000 days | Status: CP
Start: 2018-11-15 — End: ?
  Filled 2018-11-15: qty 10, 10d supply, fill #0

## 2018-11-15 MED ORDER — ACETAMINOPHEN 325 MG TABLET
ORAL_TABLET | Freq: Four times a day (QID) | ORAL | 0 refills | 15 days | Status: CP
Start: 2018-11-15 — End: ?
  Filled 2018-11-15: qty 120, 15d supply, fill #0

## 2018-11-15 MED ORDER — DOCUSATE SODIUM 100 MG CAPSULE
ORAL_CAPSULE | Freq: Two times a day (BID) | ORAL | 0 refills | 30.00000 days | PRN
Start: 2018-11-15 — End: 2018-12-15

## 2018-11-15 MED FILL — ACETAMINOPHEN 325 MG TABLET: 15 days supply | Qty: 120 | Fill #0 | Status: AC

## 2018-11-15 MED FILL — OXYCODONE 10 MG TABLET: 4 days supply | Qty: 20 | Fill #0 | Status: AC

## 2018-11-15 MED FILL — LIDOCAINE PAIN RELIEF 4 % TOPICAL PATCH: 10 days supply | Qty: 10 | Fill #0 | Status: AC

## 2018-11-16 ENCOUNTER — Encounter
Admit: 2018-11-16 | Discharge: 2018-11-17 | Payer: BLUE CROSS/BLUE SHIELD | Attending: Gynecologic Oncology | Primary: Gynecologic Oncology

## 2018-11-16 DIAGNOSIS — Z932 Ileostomy status: Principal | ICD-10-CM

## 2018-11-16 DIAGNOSIS — C561 Malignant neoplasm of right ovary: Principal | ICD-10-CM

## 2018-11-16 DIAGNOSIS — Z432 Encounter for attention to ileostomy: Principal | ICD-10-CM

## 2018-11-16 MED ORDER — LIDOCAINE 5 % TOPICAL PATCH
MEDICATED_PATCH | Freq: Two times a day (BID) | TRANSDERMAL | 0 refills | 5 days | Status: CP
Start: 2018-11-16 — End: 2019-11-16

## 2018-11-16 MED ORDER — OXYCODONE 10 MG TABLET
ORAL_TABLET | ORAL | 0 refills | 4 days | Status: CP | PRN
Start: 2018-11-16 — End: 2018-11-23

## 2018-11-16 MED ORDER — OXYCODONE ER 20 MG TABLET,CRUSH RESISTANT,EXTENDED RELEASE 12 HR
ORAL_TABLET | Freq: Two times a day (BID) | ORAL | 0 refills | 10.00000 days | Status: CP
Start: 2018-11-16 — End: ?

## 2018-11-17 DIAGNOSIS — C561 Malignant neoplasm of right ovary: Principal | ICD-10-CM

## 2018-11-17 DIAGNOSIS — R109 Unspecified abdominal pain: Principal | ICD-10-CM

## 2018-11-17 DIAGNOSIS — G8918 Other acute postprocedural pain: Principal | ICD-10-CM

## 2018-11-17 MED ORDER — MORPHINE ER 15 MG TABLET,EXTENDED RELEASE: 15 mg | tablet | Freq: Two times a day (BID) | 0 refills | 10 days | Status: AC

## 2018-11-17 MED ORDER — NALOXONE 4 MG/ACTUATION NASAL SPRAY
0 refills | 0 days | Status: CP
Start: 2018-11-17 — End: ?

## 2018-11-17 MED ORDER — MORPHINE ER 15 MG TABLET,EXTENDED RELEASE
ORAL_TABLET | Freq: Two times a day (BID) | ORAL | 0 refills | 10.00000 days | Status: CP
Start: 2018-11-17 — End: 2018-11-17

## 2018-12-20 DEATH — deceased

## 2019-01-20 ENCOUNTER — Other Ambulatory Visit: Payer: Self-pay | Admitting: Adult Health

## 2020-02-13 ENCOUNTER — Encounter: Payer: Self-pay | Admitting: Cardiology
